# Patient Record
Sex: Male | Born: 1945 | Race: White | Hispanic: No | Smoking: Former smoker
Health system: Southern US, Community
[De-identification: ages and names within clinical notes are randomized; demographics above are authoritative.]

## PROBLEM LIST (undated history)

## (undated) ENCOUNTER — Emergency Department (HOSPITAL_COMMUNITY): Payer: Self-pay | Source: Home / Self Care

## (undated) ENCOUNTER — Emergency Department (HOSPITAL_COMMUNITY): Payer: BC Managed Care – PPO

## (undated) DIAGNOSIS — G459 Transient cerebral ischemic attack, unspecified: Secondary | ICD-10-CM

## (undated) DIAGNOSIS — F988 Other specified behavioral and emotional disorders with onset usually occurring in childhood and adolescence: Secondary | ICD-10-CM

## (undated) DIAGNOSIS — K649 Unspecified hemorrhoids: Secondary | ICD-10-CM

## (undated) DIAGNOSIS — T4145XA Adverse effect of unspecified anesthetic, initial encounter: Secondary | ICD-10-CM

## (undated) DIAGNOSIS — T8859XA Other complications of anesthesia, initial encounter: Secondary | ICD-10-CM

## (undated) DIAGNOSIS — H269 Unspecified cataract: Secondary | ICD-10-CM

## (undated) DIAGNOSIS — K219 Gastro-esophageal reflux disease without esophagitis: Secondary | ICD-10-CM

## (undated) DIAGNOSIS — G43909 Migraine, unspecified, not intractable, without status migrainosus: Secondary | ICD-10-CM

## (undated) DIAGNOSIS — M1990 Unspecified osteoarthritis, unspecified site: Secondary | ICD-10-CM

## (undated) DIAGNOSIS — F32A Depression, unspecified: Secondary | ICD-10-CM

## (undated) DIAGNOSIS — F419 Anxiety disorder, unspecified: Secondary | ICD-10-CM

## (undated) DIAGNOSIS — M109 Gout, unspecified: Secondary | ICD-10-CM

## (undated) DIAGNOSIS — R413 Other amnesia: Secondary | ICD-10-CM

## (undated) DIAGNOSIS — S069X9A Unspecified intracranial injury with loss of consciousness of unspecified duration, initial encounter: Secondary | ICD-10-CM

## (undated) DIAGNOSIS — I10 Essential (primary) hypertension: Secondary | ICD-10-CM

## (undated) DIAGNOSIS — I499 Cardiac arrhythmia, unspecified: Secondary | ICD-10-CM

## (undated) DIAGNOSIS — G4700 Insomnia, unspecified: Secondary | ICD-10-CM

## (undated) DIAGNOSIS — E785 Hyperlipidemia, unspecified: Secondary | ICD-10-CM

## (undated) DIAGNOSIS — F329 Major depressive disorder, single episode, unspecified: Secondary | ICD-10-CM

## (undated) DIAGNOSIS — C61 Malignant neoplasm of prostate: Secondary | ICD-10-CM

## (undated) HISTORY — DX: Anxiety disorder, unspecified: F41.9

## (undated) HISTORY — DX: Gout, unspecified: M10.9

## (undated) HISTORY — DX: Gastro-esophageal reflux disease without esophagitis: K21.9

## (undated) HISTORY — DX: Other complications of anesthesia, initial encounter: T88.59XA

## (undated) HISTORY — DX: Unspecified cataract: H26.9

## (undated) HISTORY — DX: Depression, unspecified: F32.A

## (undated) HISTORY — DX: Unspecified intracranial injury with loss of consciousness of unspecified duration, initial encounter: S06.9X9A

## (undated) HISTORY — DX: Unspecified hemorrhoids: K64.9

## (undated) HISTORY — DX: Insomnia, unspecified: G47.00

## (undated) HISTORY — DX: Hyperlipidemia, unspecified: E78.5

## (undated) HISTORY — DX: Adverse effect of unspecified anesthetic, initial encounter: T41.45XA

## (undated) HISTORY — PX: CATARACT EXTRACTION, BILATERAL: SHX1313

## (undated) HISTORY — DX: Major depressive disorder, single episode, unspecified: F32.9

## (undated) HISTORY — DX: Other amnesia: R41.3

## (undated) HISTORY — DX: Unspecified osteoarthritis, unspecified site: M19.90

## (undated) HISTORY — DX: Other specified behavioral and emotional disorders with onset usually occurring in childhood and adolescence: F98.8

---

## 1984-08-31 HISTORY — PX: ABDOMINAL HERNIA REPAIR: SHX539

## 1999-10-02 ENCOUNTER — Ambulatory Visit (HOSPITAL_COMMUNITY): Admission: RE | Admit: 1999-10-02 | Discharge: 1999-10-02 | Payer: Self-pay | Admitting: Gastroenterology

## 2000-08-31 HISTORY — PX: INGUINAL HERNIA REPAIR: SUR1180

## 2005-01-28 ENCOUNTER — Ambulatory Visit: Payer: Self-pay | Admitting: Gastroenterology

## 2005-02-10 ENCOUNTER — Ambulatory Visit: Payer: Self-pay | Admitting: Gastroenterology

## 2011-03-18 ENCOUNTER — Encounter: Payer: Self-pay | Admitting: Gastroenterology

## 2011-08-28 ENCOUNTER — Other Ambulatory Visit: Payer: Self-pay | Admitting: Family Medicine

## 2011-08-28 DIAGNOSIS — R198 Other specified symptoms and signs involving the digestive system and abdomen: Secondary | ICD-10-CM

## 2011-08-31 ENCOUNTER — Other Ambulatory Visit: Payer: Self-pay | Admitting: Family Medicine

## 2011-08-31 ENCOUNTER — Ambulatory Visit
Admission: RE | Admit: 2011-08-31 | Discharge: 2011-08-31 | Disposition: A | Discharge status: Home / Self Care | Payer: BC Managed Care – PPO | Source: Ambulatory Visit | Attending: Family Medicine | Admitting: Family Medicine

## 2011-08-31 DIAGNOSIS — R198 Other specified symptoms and signs involving the digestive system and abdomen: Secondary | ICD-10-CM

## 2012-01-13 ENCOUNTER — Encounter: Payer: Self-pay | Admitting: Gastroenterology

## 2012-02-08 ENCOUNTER — Encounter: Payer: Self-pay | Admitting: Gastroenterology

## 2012-02-08 ENCOUNTER — Ambulatory Visit (AMBULATORY_SURGERY_CENTER): Payer: BC Managed Care – PPO | Admitting: *Deleted

## 2012-02-08 VITALS — Ht 74.0 in | Wt 209.9 lb

## 2012-02-08 DIAGNOSIS — Z1211 Encounter for screening for malignant neoplasm of colon: Secondary | ICD-10-CM

## 2012-02-08 MED ORDER — PEG-KCL-NACL-NASULF-NA ASC-C 100 G PO SOLR: ORAL | Status: DC

## 2012-02-08 NOTE — Progress Notes (Signed)
No allergies to eggs or soy products 

## 2012-02-22 ENCOUNTER — Encounter: Payer: BC Managed Care – PPO | Admitting: Gastroenterology

## 2012-02-29 HISTORY — PX: COLONOSCOPY: SHX174

## 2012-03-08 ENCOUNTER — Telehealth: Payer: Self-pay | Admitting: Gastroenterology

## 2012-03-08 NOTE — Telephone Encounter (Signed)
Spoke with patient for several minutes to review his medical history with hypoglycemia and the   effects he had with sedation.I advised pt to drink plenty of the clear liquids and to chew gum, and eat hard candy prior to colonoscopy appt.Pt is scheduled at 4:00pm on 03/15/12 for his colonoscopy,but did not want to change to an earlier appt.I advised pt to make sure he discussed this information with the nurse in admitting when he come in for his colonoscopy and I left a message today with the endoscopy center (4th floor) regarding the patients medical history prior to coming in. The patient will call back if he has any problems or further questions. Ulis Rias RN

## 2012-03-15 ENCOUNTER — Ambulatory Visit (AMBULATORY_SURGERY_CENTER): Payer: BC Managed Care – PPO | Admitting: Gastroenterology

## 2012-03-15 ENCOUNTER — Encounter: Payer: Self-pay | Admitting: Gastroenterology

## 2012-03-15 VITALS — BP 138/94 | HR 75 | Temp 98.4°F | Resp 18 | Ht 74.0 in | Wt 209.0 lb

## 2012-03-15 DIAGNOSIS — Z800 Family history of malignant neoplasm of digestive organs: Secondary | ICD-10-CM

## 2012-03-15 DIAGNOSIS — Z1211 Encounter for screening for malignant neoplasm of colon: Secondary | ICD-10-CM

## 2012-03-15 DIAGNOSIS — Z86010 Personal history of colonic polyps: Secondary | ICD-10-CM

## 2012-03-15 MED ORDER — SODIUM CHLORIDE 0.9 % IV SOLN: 500.0000 mL | INTRAVENOUS | Status: DC

## 2012-03-15 NOTE — Op Note (Signed)
[facility] [address] [place], [place]  [address]  COLONOSCOPY PROCEDURE REPORT  PATIENT:  Johnathan Sosa, Johnathan Sosa  MR#:  [id number] BIRTHDATE:  21-Apr-1946, 65 yrs. old  GENDER:  male ENDOSCOPIST:  Judie Petit T. Russella Dar, MD, [facility]  PROCEDURE DATE:  03/15/2012 PROCEDURE:  Colonoscopy 36644 ASA CLASS:  Class II INDICATIONS:  1) surveillance and high-risk screening  2) family hx of colon cancer: father  3) hx of colon polyp type unknown: 2001 MEDICATIONS:   MAC sedation, administered by CRNA 150 mg IV DESCRIPTION OF PROCEDURE:   After the risks benefits and alternatives of the procedure were thoroughly explained, informed consent was obtained.  Digital rectal exam was performed and revealed no abnormalities.   The LB [id number]-H180AL [id number] endoscope was introduced through the anus and advanced to the cecum, which was identified by both the appendix and ileocecal valve, without limitations.  The quality of the prep was good, using MoviPrep. The instrument was then slowly withdrawn as the colon was fully examined. <<PROCEDUREIMAGES>> FINDINGS:  A normal appearing cecum, ileocecal valve, and appendiceal orifice were identified. The ascending, hepatic flexure, transverse, splenic flexure, descending, sigmoid colon, and rectum appeared unremarkable.   Retroflexed views in the rectum revealed internal hemorrhoids, small.  The time to cecum = 2.5  minutes. The scope was then withdrawn (time =  10.5  min) from the patient and the procedure completed.  COMPLICATIONS:  None  ENDOSCOPIC IMPRESSION: 1) Normal colon 2) Internal hemorrhoids  RECOMMENDATIONS: 1) Repeat Colonoscopy in 5 years.  Venita Lick. Russella Dar, MD, [facility]  CC:  Catha Gosselin, MD  n. Rosalie DoctorVenita Lick. Gavino Fouch at 03/15/2012 04:17 PM  [name], [phone]

## 2012-03-15 NOTE — Progress Notes (Signed)
Patient did not experience any of the following events: a burn prior to discharge; a fall within the facility; wrong site/side/patient/procedure/implant event; or a hospital transfer or hospital admission upon discharge from the facility. (G8907) Patient did not have preoperative order for IV antibiotic SSI prophylaxis. (G8918)  

## 2012-03-15 NOTE — Patient Instructions (Addendum)

## 2012-03-16 ENCOUNTER — Telehealth: Payer: Self-pay | Admitting: *Deleted

## 2012-03-16 NOTE — Telephone Encounter (Deleted)
  Follow up Call-  Call back number 03/15/2012  Post procedure Call Back phone  # [phone]  Permission to leave phone message Yes     Patient questions:  Do you have a fever, pain , or abdominal swelling? {yes no:314532} Pain Score  {NUMBERS; 0-10:5044} *  Have you tolerated food without any problems? {yes no:314532}  Have you been able to return to your normal activities? {yes no:314532}  Do you have any questions about your discharge instructions: Diet   {yes no:314532} Medications  {yes no:314532} Follow up visit  {yes no:314532}  Do you have questions or concerns about your Care? {yes no:314532}  Actions: * If pain score is 4 or above: {ACTION; [facility] ENDO PAIN >4:21563::"No action needed, pain <4."}

## 2012-03-16 NOTE — Telephone Encounter (Signed)
No answer. Identifier of phone number. Message left to call if questions or concerns.

## 2012-04-04 ENCOUNTER — Encounter: Payer: BC Managed Care – PPO | Admitting: Gastroenterology

## 2012-04-27 ENCOUNTER — Ambulatory Visit (INDEPENDENT_AMBULATORY_CARE_PROVIDER_SITE_OTHER): Payer: BC Managed Care – PPO | Admitting: Family Medicine

## 2012-04-27 ENCOUNTER — Ambulatory Visit (HOSPITAL_COMMUNITY)
Admission: RE | Admit: 2012-04-27 | Discharge: 2012-04-27 | Disposition: A | Discharge status: Home / Self Care | Payer: BC Managed Care – PPO | Source: Ambulatory Visit | Attending: Family Medicine | Admitting: Family Medicine

## 2012-04-27 ENCOUNTER — Emergency Department (HOSPITAL_COMMUNITY): Payer: BC Managed Care – PPO

## 2012-04-27 ENCOUNTER — Encounter (HOSPITAL_COMMUNITY): Payer: Self-pay | Admitting: Emergency Medicine

## 2012-04-27 ENCOUNTER — Observation Stay (HOSPITAL_COMMUNITY)
Admission: EM | Admit: 2012-04-27 | Discharge: 2012-04-30 | DRG: 883 | Disposition: A | Discharge status: Home / Self Care | Payer: BC Managed Care – PPO | Attending: General Surgery | Admitting: General Surgery

## 2012-04-27 ENCOUNTER — Ambulatory Visit (HOSPITAL_COMMUNITY)
Admission: EM | Admit: 2012-04-27 | Payer: BC Managed Care – PPO | Source: Other Acute Inpatient Hospital | Admitting: General Surgery

## 2012-04-27 VITALS — BP 138/84 | HR 76 | Temp 98.4°F | Resp 18 | Ht 75.0 in | Wt 205.0 lb

## 2012-04-27 DIAGNOSIS — I10 Essential (primary) hypertension: Secondary | ICD-10-CM | POA: Insufficient documentation

## 2012-04-27 DIAGNOSIS — D72829 Elevated white blood cell count, unspecified: Secondary | ICD-10-CM

## 2012-04-27 DIAGNOSIS — I700 Atherosclerosis of aorta: Secondary | ICD-10-CM | POA: Insufficient documentation

## 2012-04-27 DIAGNOSIS — R109 Unspecified abdominal pain: Secondary | ICD-10-CM

## 2012-04-27 DIAGNOSIS — F988 Other specified behavioral and emotional disorders with onset usually occurring in childhood and adolescence: Secondary | ICD-10-CM | POA: Insufficient documentation

## 2012-04-27 DIAGNOSIS — K3580 Unspecified acute appendicitis: Secondary | ICD-10-CM | POA: Insufficient documentation

## 2012-04-27 DIAGNOSIS — K560 Paralytic ileus: Secondary | ICD-10-CM | POA: Insufficient documentation

## 2012-04-27 DIAGNOSIS — E785 Hyperlipidemia, unspecified: Secondary | ICD-10-CM | POA: Insufficient documentation

## 2012-04-27 DIAGNOSIS — R1031 Right lower quadrant pain: Secondary | ICD-10-CM

## 2012-04-27 DIAGNOSIS — K219 Gastro-esophageal reflux disease without esophagitis: Secondary | ICD-10-CM | POA: Insufficient documentation

## 2012-04-27 DIAGNOSIS — M109 Gout, unspecified: Secondary | ICD-10-CM | POA: Insufficient documentation

## 2012-04-27 HISTORY — DX: Essential (primary) hypertension: I10

## 2012-04-27 HISTORY — DX: Migraine, unspecified, not intractable, without status migrainosus: G43.909

## 2012-04-27 LAB — POCT CBC
Hemoglobin: 16.1 g/dL (ref 14.1–18.1)
Lymph, poc: 2.8 (ref 0.6–3.4)
MCH, POC: 30 pg (ref 27–31.2)
MCV: 93.5 fL (ref 80–97)
RBC: 5.36 M/uL (ref 4.69–6.13)
WBC: 11.1 10*3/uL — AB (ref 4.6–10.2)

## 2012-04-27 LAB — POCT URINALYSIS DIPSTICK
Bilirubin, UA: NEGATIVE
Ketones, UA: NEGATIVE
Leukocytes, UA: NEGATIVE
pH, UA: 6

## 2012-04-27 LAB — POCT UA - MICROSCOPIC ONLY
Casts, Ur, LPF, POC: NEGATIVE
Mucus, UA: POSITIVE

## 2012-04-27 MED ORDER — SODIUM CHLORIDE 0.9 % IV SOLN
Freq: Once | INTRAVENOUS | Status: AC
  Administered 2012-04-27: via INTRAVENOUS

## 2012-04-27 MED ORDER — SODIUM CHLORIDE 0.9 % IV SOLN
3.0000 g | Freq: Once | INTRAVENOUS | Status: AC
  Administered 2012-04-27: 3 g via INTRAVENOUS
  Filled 2012-04-27: qty 3

## 2012-04-27 MED ORDER — BUPIVACAINE-EPINEPHRINE PF 0.25-1:200000 % IJ SOLN
INTRAMUSCULAR | Status: AC
  Filled 2012-04-27: qty 30

## 2012-04-27 MED ORDER — ONDANSETRON HCL 4 MG/2ML IJ SOLN
4.0000 mg | Freq: Four times a day (QID) | INTRAMUSCULAR | Status: DC | PRN
  Administered 2012-04-28 (×2): 4 mg via INTRAVENOUS
  Filled 2012-04-27 (×2): qty 2

## 2012-04-27 MED ORDER — MORPHINE SULFATE 4 MG/ML IJ SOLN
2.0000 mg | INTRAMUSCULAR | Status: DC | PRN
  Administered 2012-04-28 – 2012-04-29 (×4): 2 mg via INTRAVENOUS
  Filled 2012-04-27 (×4): qty 1

## 2012-04-27 MED ORDER — IOHEXOL 300 MG/ML  SOLN
100.0000 mL | Freq: Once | INTRAMUSCULAR | Status: AC | PRN
  Administered 2012-04-27: 100 mL via INTRAVENOUS

## 2012-04-27 MED ORDER — LIDOCAINE HCL 1 % IJ SOLN
INTRAMUSCULAR | Status: AC
  Filled 2012-04-27: qty 20

## 2012-04-27 NOTE — Patient Instructions (Addendum)
It is so great to see you again Johnathan Sosa. I want to make sure that your appendix is okay. We are going to send you for a CT scan now. Please call [facility] and please call Dr. Neva Seat or Dr. Katrinka Blazing to give results call [phone] If for some reason this pain gets much worse, you are unable to keep any food or liquid down then I want you to go to the emergency department. Hopefully I will see you again soon.

## 2012-04-27 NOTE — Progress Notes (Signed)
  Subjective:    Patient ID: Johnathan Sosa, male    DOB: 1946/02/13, 66 y.o.   MRN: [id number]  HPI 66 year old male coming in with vague abdominal pain for last several days. Patient states that this did start his right lower quadrant and has now become much more generalized. Patient states starting approximately 3 days ago he started working out again which he believed was the cause of the pain initially. Now though patient states that he has nausea as well as decreased appetite and not feeling like himself. Patient denies any vomiting episodes, diarrhea or constipation. Patient has not had abdominal pain like this before. Patient denies any fever, chills, shortness of breath or chest pain. Patient also denies any back pain or any dysuria. Patient states that the pain now is constant seems to be worsening over the course of time and seems to be starting to localize more in the right lower quadrant. Patient denies any radiation of pain   medications include Klonopin 0.5 mg as needed, Wellbutrin XL 300 mg daily Past medical history is significant for allergies, arthritis, hypertension, migraine headaches Social history, patient does drink occasionally, quit smoking in 1982, and denies illicit use. Patient is drinking caffeine and usually exercises about twice a week. Family history includes cancer in his mother otherwise unremarkable.  Review of Systems As stated above in history of present illness    Objective:   Physical Exam BP 138/84  Pulse 76  Temp 98.4 F (36.9 C) (Oral)  Resp 18  Ht 6\' 3"  (1.905 m)  Wt 205 lb (92.987 kg)  BMI 25.62 kg/m2  SpO2 98% General appearance: alert, cooperative, appears stated age, fatigued and no distress Eyes: conjunctivae/corneas clear. PERRL, EOM's intact. Fundi benign. Throat: lips, mucosa, and tongue normal; teeth and gums normal Neck: no adenopathy, no carotid bruit, no JVD, supple, symmetrical, trachea midline and thyroid not enlarged, symmetric,  no tenderness/mass/nodules Lungs: clear to auscultation bilaterally Heart: regular rate and rhythm, S1, S2 normal, no murmur, click, rub or gallop and normal apical impulse Abdomen: abnormal findings:  guarding, rebound tenderness and Patient does have a positive McBurney's point and pain is generalized but most severe in the right lower contract. Patient also has involuntary guarding and rebound tenderness. Extremities: extremities normal, atraumatic, no cyanosis or edema Pulses: 2+ and symmetric Skin: Skin color, texture, turgor normal. No rashes or lesions  Patient did have a CBC that  Did show an elevated white blood cell count of 11.1 Urine analysis is positive for microscopic hematuria. EKG was ordered and interpreted by me today. Patient has regular rate and rhythm with no ST depressions or elevations or any T-wave inversions. Patient has very good to a progression. Normal EKG    Assessment & Plan:

## 2012-04-27 NOTE — Assessment & Plan Note (Signed)
Patient's history as well as clinical exam is consistent with the clinical diagnosis of appendicitis. Patient does have an elevated white blood cell count, decreased appetite, and focal right lower quadrant abdominal pain. At this point we will send patient over to the hospital to get a CT scan of the abdominal and pelvis with contrast. We will have them call us with the results. Patient has been given red flags and when to seek medical attention. Patient feels fine to drive at this time. We will followup with patient once we know the results of the CT scan.

## 2012-04-27 NOTE — ED Notes (Signed)
Pt reports right lower abdominal pain and went to MD this afternoon and had CT test completed. Pt reports MD stated he had appendicitis and to report for surgery this evening. NAD

## 2012-04-27 NOTE — ED Notes (Signed)
RN notified that pt has not been triaged.

## 2012-04-27 NOTE — Progress Notes (Signed)
Patient discussed with Dr. Katrinka Blazing. Agree with assessment and plan of care per his note.    CT report at 2205:   Findings: Calcified hilar lymph nodes and right middle lobe and  left lower lobe nodules, in keeping with sequelae of prior  granulomas infection. Mild bibasilar opacities. Heart size within  normal limits to mildly enlarged. No pleural or pericardial  effusion.  Unremarkable liver, biliary system, spleen, pancreas, adrenal  glands.  Hypodensity within the upper pole of each kidney, incompletely  characterized, simple cyst favored. No hydronephrosis or  hydroureter.  No bowel obstruction. No CT evidence for colitis. Distended  appendix measuring 14 mm, with periappendiceal fat stranding. No  free intraperitoneal air or loculated fluid. No lymphadenopathy.  Scattered atherosclerosis of the aorta and branch vessels. There  is infrarenal ectasia without aneurysmal dilatation.  Circumferential bladder wall thickening is nonspecific given  incomplete distension. Status post right inguinal hernia repair.  L5-S1 degenerative changes of the imaged spine. No acute or  aggressive appearing osseous lesion.  IMPRESSION:  Acute appendicitis.  Original Report Authenticated By: Waneta Martins, M.D.   Discussed with patient on phone in CT waiting room.  Called general surgeon on call - discussed patient with Dr. Donell Beers. Patient to register in ER, and will be evaluated by Dr. Donell Beers.  Patient advised of plan and understanding expressed.

## 2012-04-27 NOTE — H&P (Signed)
Johnathan Sosa is an 66 y.o. male.   Chief Complaint: Abdominal pain HPI:  Pt is a 66 year old male with 2-3 days of abdominal pain.  It started in the mid abdomen and localized to the RLQ.  He thought this was related to working out, but it kept getting worse, especially today.  He denies diarrhea or constipation.  He denies dysuria.  He has not had shortness of breath.  He had never had pain like this before.  He had a antibiotic prescription for dental work, and he started taking it again.  He went to urgent care and was referred to [facility] for CT scan which was positive for appendicitis.  He has had anorexia and nausea.    Past Medical History  Diagnosis Date  . Gout   . Hyperlipidemia   . ADD (attention deficit disorder)   . Arthritis   . GERD (gastroesophageal reflux disease)     Past Surgical History  Procedure Date  . Abdominal hernia repair 1986  . Inguinal hernia repair 2002    left    Family History  Problem Relation Age of Onset  . Colon cancer Father 21  . Heart failure Father   . Cancer Father   . Stomach cancer Neg Hx   . Cancer Mother    Social History:  reports that he quit smoking about 31 years ago. He has never used smokeless tobacco. He reports that he drinks alcohol. He reports that he does not use illicit drugs.  Allergies:  Allergies  Allergen Reactions  . Black Pepper ([place]) Swelling    redness of skin, profuse sweating  . Sulfa Antibiotics Itching and Rash     MedicationsLong-Term  Prescriptions Show Facility-Administered Medications    allopurinol (ZYLOPRIM) 300 MG tablet   amLODipine (NORVASC) 5 MG tablet   atorvastatin (LIPITOR) 20 MG tablet   buPROPion (WELLBUTRIN XL) 300 MG 24 hr tablet   clonazePAM (KLONOPIN) 1 MG tablet   CODEINE SULFATE PO   Multiple Vitamin (MULTIVITAMIN) tablet   PRESCRIPTION MEDICATION     Results for orders placed in visit on 04/27/12 (from the past 48 hour(s))  POCT CBC     Status: Abnormal   Collection  Time   04/27/12  6:09 PM      Component Value Range Comment   WBC 11.1 (*) 4.6 - 10.2 K/uL    Lymph, poc 2.8  0.6 - 3.4    POC LYMPH PERCENT 25.1  10 - 50 %L    MID (cbc) 0.6  0 - 0.9    POC MID % 5.8  0 - 12 %M    POC Granulocyte 7.7 (*) 2 - 6.9    Granulocyte percent 69.1  37 - 80 %G    RBC 5.36  4.69 - 6.13 M/uL    Hemoglobin 16.1  14.1 - 18.1 g/dL    HCT, POC 16.1  09.6 - 53.7 %    MCV 93.5  80 - 97 fL    MCH, POC 30.0  27 - 31.2 pg    MCHC 32.1  31.8 - 35.4 g/dL    RDW, POC 04.5      Platelet Count, POC 268  142 - 424 K/uL    MPV 8.5  0 - 99.8 fL   POCT URINALYSIS DIPSTICK     Status: Normal   Collection Time   04/27/12  6:15 PM      Component Value Range Comment   Color, UA yellow  Clarity, UA clear      Glucose, UA neg      Bilirubin, UA neg      Ketones, UA neg      Spec Grav, UA 1.020      Blood, UA trace      pH, UA 6.0      Protein, UA neg      Urobilinogen, UA 0.2      Nitrite, UA neg      Leukocytes, UA Negative     POCT UA - MICROSCOPIC ONLY     Status: Normal   Collection Time   04/27/12  6:17 PM      Component Value Range Comment   WBC, Ur, HPF, POC 0-1      RBC, urine, microscopic 5-6      Bacteria, U Microscopic 1+      Mucus, UA pos      Epithelial cells, urine per micros neg      Crystals, Ur, HPF, POC neg      Casts, Ur, LPF, POC neg      Yeast, UA neg      Ct Abdomen Pelvis W Contrast  04/27/2012  *RADIOLOGY REPORT*  Clinical Data: Right lower quadrant pain  CT ABDOMEN AND PELVIS WITH CONTRAST  Technique:  Multidetector CT imaging of the abdomen and pelvis was performed following the standard protocol during bolus administration of intravenous contrast.  Contrast: OMNIPAQUE IOHEXOL 300 MG/ML  SOLN  Comparison: 08/31/2011 ultrasound  Findings: Calcified hilar lymph nodes and right middle lobe and left lower lobe nodules, in keeping with sequelae of prior granulomas infection. Mild bibasilar opacities.  Heart size within normal limits to  mildly enlarged.  No pleural or pericardial effusion.  Unremarkable liver, biliary system, spleen, pancreas, adrenal glands.  Hypodensity within the upper pole of each kidney, incompletely characterized, simple cyst favored.  No hydronephrosis or hydroureter.  No bowel obstruction.  No CT evidence for colitis.  Distended appendix measuring 14 mm, with periappendiceal fat stranding.  No free intraperitoneal air or loculated fluid.  No lymphadenopathy.  Scattered atherosclerosis of the aorta and branch vessels.  There is infrarenal ectasia without aneurysmal dilatation.  Circumferential bladder wall thickening is nonspecific given incomplete distension.  Status post right inguinal hernia repair.  L5-S1 degenerative changes of the imaged spine. No acute or aggressive appearing osseous lesion.  IMPRESSION: Acute appendicitis.   Original Report Authenticated By: Waneta Martins, M.D.     Review of Systems  Constitutional: Positive for fever.  HENT: Negative.   Eyes: Negative.   Respiratory: Negative.   Cardiovascular: Negative.   Gastrointestinal: Positive for nausea and abdominal pain (right lower quadrant). Negative for diarrhea and constipation.       Anorexia   Genitourinary: Negative.   Musculoskeletal: Negative.   Skin: Negative.   Neurological: Negative.   Endo/Heme/Allergies: Negative.   Psychiatric/Behavioral: Negative.     Blood pressure 127/88, pulse 88, temperature 97.9 F (36.6 C), temperature source Oral, resp. rate 18, height 6\' 3"  (1.905 m), weight 199 lb (90.266 kg), SpO2 98.00%. Physical Exam  Constitutional: He is oriented to person, place, and time. He appears well-developed and well-nourished.  HENT:  Head: Normocephalic and atraumatic.  Right Ear: External ear normal.  Left Ear: External ear normal.  Eyes: Pupils are equal, round, and reactive to light. No scleral icterus.  Neck: Normal range of motion. Neck supple. No tracheal deviation present. No thyromegaly  present.  Cardiovascular: Normal rate, regular rhythm, normal heart sounds  and intact distal pulses.  Exam reveals no gallop and no friction rub.   No murmur heard. Respiratory: Effort normal and breath sounds normal. No respiratory distress. He exhibits no tenderness.  GI: Soft. Bowel sounds are normal. He exhibits no mass. There is tenderness (right lower quadrant). There is guarding (voluntary). There is no rebound.  Musculoskeletal: Normal range of motion.  Lymphadenopathy:    He has no cervical adenopathy.  Neurological: He is alert and oriented to person, place, and time. Coordination normal.  Skin: Skin is warm and dry. No rash noted. No erythema. No pallor.  Psychiatric: He has a normal mood and affect. His behavior is normal. Judgment and thought content normal.     Assessment/Plan Acute appendicitis. IVF NPO IV antibiotics Plan lap appendectomy  Appendectomy was described to the patient.  The incisions and surgical technique were explained.  The patient was advised that some of the hair on the abdomen would be clipped, and that a foley catheter would be placed.  I advised the patient of the risks of surgery including, but not limited to, bleeding, infection, damage to other structures, risk of an open operation, risk of abscess, and risk of blood clot.  The recovery was also described to the patient.  He was advised that he will have lifting restrictions for 2 weeks.     Brayla Pat 04/27/2012, 11:28 PM

## 2012-04-27 NOTE — ED Notes (Signed)
Pt was supposed to be a direct admit to OR for Dr. Donell Beers. Surgeon currently in surgery and unable to operate on patient at this time. Bed placement notified but no bed available at this time. AC notified and states she will notify OR of where pt is located.

## 2012-04-27 NOTE — ED Provider Notes (Signed)
History     CSN: [id number]  Arrival date & time 04/27/12  2243   First MD Initiated Contact with Patient 04/27/12 2322      Chief Complaint  Patient presents with  . Appendicitis     (Consider location/radiation/quality/duration/timing/severity/associated sxs/prior treatment) HPI Comments: 66 y/o male with hx of htn, gout and arthritis who presents with RLQ abd pain which is constant and has been present for 2 days but got acutely worse at 10 AM this AM - seen at [facility] and had CT ordered - CT read as acute appy and was sent to ED for surgeon to see.  He has no f/n/v but does have some chills.  Sx are persistent, worse with palpation. Hx of  2 hernia repairs in the past  The history is provided by the patient, the spouse and medical records.    Past Medical History  Diagnosis Date  . Gout   . Hyperlipidemia   . ADD (attention deficit disorder)   . Arthritis   . GERD (gastroesophageal reflux disease)     Past Surgical History  Procedure Date  . Abdominal hernia repair 1986  . Inguinal hernia repair 2002    left    Family History  Problem Relation Age of Onset  . Colon cancer Father 6  . Heart failure Father   . Cancer Father   . Stomach cancer Neg Hx   . Cancer Mother     History  Substance Use Topics  . Smoking status: Former Smoker    Quit date: 02/07/1981  . Smokeless tobacco: Never Used  . Alcohol Use: Yes     occasional      Review of Systems  All other systems reviewed and are negative.    Allergies  Black pepper and Sulfa antibiotics  Home Medications   Current Outpatient Rx  Name Route Sig Dispense Refill  . ALLOPURINOL 300 MG PO TABS Oral Take 300 mg by mouth daily.     [name] Kitchen AMLODIPINE BESYLATE 5 MG PO TABS Oral Take 5 mg by mouth daily.     . ATORVASTATIN CALCIUM 20 MG PO TABS Oral Take 20 mg by mouth daily.     . BUPROPION HCL ER (XL) 300 MG PO TB24 Oral Take 300 mg by mouth daily.     [name] Kitchen CLONAZEPAM 1 MG PO TABS Oral Take 1 mg by mouth at  bedtime as needed. Takes one half tablet    . CODEINE SULFATE PO Oral Take by mouth.    . ONE-DAILY MULTI VITAMINS PO TABS Oral Take 1 tablet by mouth daily.    [name] Kitchen PRESCRIPTION MEDICATION        BP 127/88  Pulse 88  Temp 97.9 F (36.6 C) (Oral)  Resp 18  Ht 6\' 3"  (1.905 m)  Wt 199 lb (90.266 kg)  BMI 24.87 kg/m2  SpO2 98%  Physical Exam  Nursing note and vitals reviewed. Constitutional: He appears well-developed and well-nourished. No distress.  HENT:  Head: Normocephalic and atraumatic.  Mouth/Throat: Oropharynx is clear and moist. No oropharyngeal exudate.  Eyes: Conjunctivae and EOM are normal. Pupils are equal, round, and reactive to light. Right eye exhibits no discharge. Left eye exhibits no discharge. No scleral icterus.  Neck: Normal range of motion. Neck supple. No JVD present. No thyromegaly present.  Cardiovascular: Normal rate, regular rhythm, normal heart sounds and intact distal pulses.  Exam reveals no gallop and no friction rub.   No murmur heard. Pulmonary/Chest: Effort normal and breath sounds  normal. No respiratory distress. He has no wheezes. He has no rales.  Abdominal: Soft. Bowel sounds are normal. He exhibits no distension and no mass. There is tenderness ( focal RLQ ttp with guarding, no peritoneal signs).  Musculoskeletal: Normal range of motion. He exhibits no edema and no tenderness.  Lymphadenopathy:    He has no cervical adenopathy.  Neurological: He is alert. Coordination normal.  Skin: Skin is warm and dry. No rash noted. No erythema.  Psychiatric: He has a normal mood and affect. His behavior is normal.    ED Course  Procedures (including critical care time)   Labs Reviewed  CBC WITH DIFFERENTIAL  BASIC METABOLIC PANEL   Ct Abdomen Pelvis W Contrast  04/27/2012  *RADIOLOGY REPORT*  Clinical Data: Right lower quadrant pain  CT ABDOMEN AND PELVIS WITH CONTRAST  Technique:  Multidetector CT imaging of the abdomen and pelvis was performed  following the standard protocol during bolus administration of intravenous contrast.  Contrast: OMNIPAQUE IOHEXOL 300 MG/ML  SOLN  Comparison: 08/31/2011 ultrasound  Findings: Calcified hilar lymph nodes and right middle lobe and left lower lobe nodules, in keeping with sequelae of prior granulomas infection. Mild bibasilar opacities.  Heart size within normal limits to mildly enlarged.  No pleural or pericardial effusion.  Unremarkable liver, biliary system, spleen, pancreas, adrenal glands.  Hypodensity within the upper pole of each kidney, incompletely characterized, simple cyst favored.  No hydronephrosis or hydroureter.  No bowel obstruction.  No CT evidence for colitis.  Distended appendix measuring 14 mm, with periappendiceal fat stranding.  No free intraperitoneal air or loculated fluid.  No lymphadenopathy.  Scattered atherosclerosis of the aorta and branch vessels.  There is infrarenal ectasia without aneurysmal dilatation.  Circumferential bladder wall thickening is nonspecific given incomplete distension.  Status post right inguinal hernia repair.  L5-S1 degenerative changes of the imaged spine. No acute or aggressive appearing osseous lesion.  IMPRESSION: Acute appendicitis.   Original Report Authenticated By: Waneta Martins, M.D.      No diagnosis found.    MDM  Labs ordered, pt appears stable preop abx ordered.  Surgery aware        Vida Roller, MD 04/28/12 [phone]

## 2012-04-28 ENCOUNTER — Encounter (HOSPITAL_COMMUNITY)
Admission: EM | Disposition: A | Discharge status: Home / Self Care | Payer: Self-pay | Source: Home / Self Care | Attending: Emergency Medicine

## 2012-04-28 ENCOUNTER — Emergency Department (HOSPITAL_COMMUNITY): Payer: BC Managed Care – PPO | Admitting: Anesthesiology

## 2012-04-28 ENCOUNTER — Encounter (HOSPITAL_COMMUNITY): Payer: Self-pay | Admitting: Anesthesiology

## 2012-04-28 ENCOUNTER — Ambulatory Visit: Admit: 2012-04-28 | Payer: Self-pay | Admitting: General Surgery

## 2012-04-28 ENCOUNTER — Encounter (HOSPITAL_COMMUNITY): Payer: Self-pay | Admitting: *Deleted

## 2012-04-28 DIAGNOSIS — K3580 Unspecified acute appendicitis: Secondary | ICD-10-CM | POA: Diagnosis present

## 2012-04-28 HISTORY — PX: LAPAROSCOPIC APPENDECTOMY: SHX408

## 2012-04-28 LAB — CBC
MCV: 85.5 fL (ref 78.0–100.0)
Platelets: 192 10*3/uL (ref 150–400)
RDW: 12.5 % (ref 11.5–15.5)
WBC: 11.5 10*3/uL — ABNORMAL HIGH (ref 4.0–10.5)

## 2012-04-28 LAB — BASIC METABOLIC PANEL
BUN: 14 mg/dL (ref 6–23)
Calcium: 8.6 mg/dL (ref 8.4–10.5)
Calcium: 9.4 mg/dL (ref 8.4–10.5)
Creatinine, Ser: 1.08 mg/dL (ref 0.50–1.35)
Creatinine, Ser: 0.76 mg/dL (ref 0.50–1.35)
GFR calc Af Amer: 82 mL/min — ABNORMAL LOW (ref >90)
GFR calc Af Amer: >90 mL/min (ref >90)
GFR calc non Af Amer: 71 mL/min — ABNORMAL LOW (ref >90)

## 2012-04-28 LAB — CBC WITH DIFFERENTIAL/PLATELET
Basophils Absolute: 0 10*3/uL (ref 0.0–0.1)
Basophils Relative: 0 % (ref 0–1)
Eosinophils Absolute: 0.1 10*3/uL (ref 0.0–0.7)
Eosinophils Relative: 1 % (ref 0–5)
HCT: 44.4 % (ref 39.0–52.0)
MCH: 31.5 pg (ref 26.0–34.0)
MCHC: 36.5 g/dL — ABNORMAL HIGH (ref 30.0–36.0)
MCV: 86.4 fL (ref 78.0–100.0)
Monocytes Absolute: 0.5 10*3/uL (ref 0.1–1.0)
RDW: 12.6 % (ref 11.5–15.5)

## 2012-04-28 SURGERY — APPENDECTOMY, LAPAROSCOPIC
Anesthesia: General | Site: Abdomen | Wound class: Contaminated

## 2012-04-28 MED ORDER — PROPOFOL 10 MG/ML IV BOLUS
INTRAVENOUS | Status: DC | PRN
  Administered 2012-04-28: 200 mg via INTRAVENOUS

## 2012-04-28 MED ORDER — SUCCINYLCHOLINE CHLORIDE 20 MG/ML IJ SOLN
INTRAMUSCULAR | Status: DC | PRN
  Administered 2012-04-28: 140 mg via INTRAVENOUS

## 2012-04-28 MED ORDER — LACTATED RINGERS IR SOLN
Status: DC | PRN
  Administered 2012-04-28: 500 mL

## 2012-04-28 MED ORDER — SODIUM CHLORIDE 0.9 % IV SOLN
INTRAVENOUS | Status: DC | PRN
  Administered 2012-04-28 (×2): via INTRAVENOUS

## 2012-04-28 MED ORDER — EPHEDRINE SULFATE 50 MG/ML IJ SOLN
INTRAMUSCULAR | Status: DC | PRN
  Administered 2012-04-28: 10 mg via INTRAVENOUS
  Administered 2012-04-28: 5 mg via INTRAVENOUS
  Administered 2012-04-28: 10 mg via INTRAVENOUS

## 2012-04-28 MED ORDER — ONDANSETRON HCL 4 MG/2ML IJ SOLN: 4.0000 mg | Freq: Four times a day (QID) | INTRAMUSCULAR | Status: DC | PRN

## 2012-04-28 MED ORDER — IBUPROFEN 200 MG PO TABS: ORAL_TABLET | ORAL | Status: DC

## 2012-04-28 MED ORDER — ENOXAPARIN SODIUM 40 MG/0.4ML ~~LOC~~ SOLN: 40.0000 mg | SUBCUTANEOUS | Status: DC

## 2012-04-28 MED ORDER — KETOROLAC TROMETHAMINE 30 MG/ML IJ SOLN
30.0000 mg | Freq: Four times a day (QID) | INTRAMUSCULAR | Status: AC
  Administered 2012-04-28 – 2012-04-29 (×4): 30 mg via INTRAVENOUS
  Filled 2012-04-28 (×4): qty 1

## 2012-04-28 MED ORDER — ACETAMINOPHEN 10 MG/ML IV SOLN
INTRAVENOUS | Status: AC
  Filled 2012-04-28: qty 100

## 2012-04-28 MED ORDER — OXYCODONE-ACETAMINOPHEN 5-325 MG PO TABS
1.0000 | ORAL_TABLET | ORAL | Status: DC | PRN
Start: 2012-04-28 — End: 2012-04-29

## 2012-04-28 MED ORDER — ROCURONIUM BROMIDE 100 MG/10ML IV SOLN
INTRAVENOUS | Status: DC | PRN
  Administered 2012-04-28: 20 mg via INTRAVENOUS

## 2012-04-28 MED ORDER — ACETAMINOPHEN 10 MG/ML IV SOLN
INTRAVENOUS | Status: DC | PRN
  Administered 2012-04-28: 1000 mg via INTRAVENOUS

## 2012-04-28 MED ORDER — ERTAPENEM SODIUM 1 G IJ SOLR
1.0000 g | INTRAMUSCULAR | Status: AC
  Administered 2012-04-28: 1 g via INTRAVENOUS
  Filled 2012-04-28: qty 1

## 2012-04-28 MED ORDER — CEPHALEXIN 500 MG PO CAPS: 500.0000 mg | ORAL_CAPSULE | Freq: Four times a day (QID) | ORAL | Status: DC

## 2012-04-28 MED ORDER — ENOXAPARIN SODIUM 40 MG/0.4ML ~~LOC~~ SOLN
40.0000 mg | SUBCUTANEOUS | Status: DC
  Administered 2012-04-28 – 2012-04-29 (×2): 40 mg via SUBCUTANEOUS
  Filled 2012-04-28 (×3): qty 0.4

## 2012-04-28 MED ORDER — AMLODIPINE BESYLATE 2.5 MG PO TABS
2.5000 mg | ORAL_TABLET | Freq: Every day | ORAL | Status: DC
  Filled 2012-04-28 (×2): qty 1

## 2012-04-28 MED ORDER — HYDROMORPHONE HCL PF 1 MG/ML IJ SOLN
0.2500 mg | INTRAMUSCULAR | Status: DC | PRN
  Administered 2012-04-28 (×2): 0.5 mg via INTRAVENOUS

## 2012-04-28 MED ORDER — ACETAMINOPHEN 325 MG PO TABS: 650.0000 mg | ORAL_TABLET | Freq: Four times a day (QID) | ORAL | Status: DC | PRN

## 2012-04-28 MED ORDER — NEOSTIGMINE METHYLSULFATE 1 MG/ML IJ SOLN
INTRAMUSCULAR | Status: DC | PRN
  Administered 2012-04-28: 4 mg via INTRAVENOUS

## 2012-04-28 MED ORDER — LIDOCAINE HCL (PF) 1 % IJ SOLN
INTRAMUSCULAR | Status: DC | PRN
  Administered 2012-04-28: 5 mL

## 2012-04-28 MED ORDER — KCL IN DEXTROSE-NACL 20-5-0.45 MEQ/L-%-% IV SOLN
INTRAVENOUS | Status: DC
  Administered 2012-04-28 – 2012-04-29 (×5): via INTRAVENOUS
  Filled 2012-04-28 (×7): qty 1000

## 2012-04-28 MED ORDER — ATORVASTATIN CALCIUM 20 MG PO TABS
20.0000 mg | ORAL_TABLET | Freq: Every day | ORAL | Status: DC
  Administered 2012-04-29: 20 mg via ORAL
  Filled 2012-04-28 (×3): qty 1

## 2012-04-28 MED ORDER — BUPROPION HCL ER (XL) 300 MG PO TB24
300.0000 mg | ORAL_TABLET | Freq: Every day | ORAL | Status: DC
Start: 2012-04-28 — End: 2012-04-30
  Filled 2012-04-28 (×3): qty 1

## 2012-04-28 MED ORDER — LIDOCAINE HCL (CARDIAC) 20 MG/ML IV SOLN
INTRAVENOUS | Status: DC | PRN
  Administered 2012-04-28: 80 mg via INTRAVENOUS

## 2012-04-28 MED ORDER — AMLODIPINE BESYLATE 5 MG PO TABS
5.0000 mg | ORAL_TABLET | Freq: Every day | ORAL | Status: DC
  Filled 2012-04-28: qty 1

## 2012-04-28 MED ORDER — HYDROMORPHONE HCL PF 1 MG/ML IJ SOLN
INTRAMUSCULAR | Status: AC
  Filled 2012-04-28: qty 1

## 2012-04-28 MED ORDER — PROMETHAZINE HCL 25 MG/ML IJ SOLN: 6.2500 mg | INTRAMUSCULAR | Status: DC | PRN

## 2012-04-28 MED ORDER — GLYCOPYRROLATE 0.2 MG/ML IJ SOLN
INTRAMUSCULAR | Status: DC | PRN
  Administered 2012-04-28: 0.6 mg via INTRAVENOUS

## 2012-04-28 MED ORDER — OXYCODONE-ACETAMINOPHEN 5-325 MG PO TABS: 1.0000 | ORAL_TABLET | ORAL | Status: DC | PRN

## 2012-04-28 MED ORDER — ONDANSETRON HCL 4 MG PO TABS: 4.0000 mg | ORAL_TABLET | Freq: Four times a day (QID) | ORAL | Status: DC | PRN

## 2012-04-28 MED ORDER — ALLOPURINOL 300 MG PO TABS
300.0000 mg | ORAL_TABLET | Freq: Every day | ORAL | Status: DC
  Filled 2012-04-28 (×3): qty 1

## 2012-04-28 MED ORDER — CHLORHEXIDINE GLUCONATE 0.12 % MT SOLN
15.0000 mL | Freq: Two times a day (BID) | OROMUCOSAL | Status: DC
  Administered 2012-04-28 – 2012-04-30 (×5): 15 mL via OROMUCOSAL
  Filled 2012-04-28 (×6): qty 15

## 2012-04-28 MED ORDER — CLONAZEPAM 1 MG PO TABS
1.0000 mg | ORAL_TABLET | Freq: Every evening | ORAL | Status: DC | PRN
Start: 2012-04-28 — End: 2012-04-30
  Administered 2012-04-30: 1 mg via ORAL
  Filled 2012-04-28: qty 1

## 2012-04-28 MED ORDER — IBUPROFEN 600 MG PO TABS: 600.0000 mg | ORAL_TABLET | Freq: Four times a day (QID) | ORAL | Status: DC | PRN

## 2012-04-28 MED ORDER — BUPIVACAINE-EPINEPHRINE 0.25% -1:200000 IJ SOLN
INTRAMUSCULAR | Status: DC | PRN
  Administered 2012-04-28: 5 mL

## 2012-04-28 MED ORDER — FENTANYL CITRATE 0.05 MG/ML IJ SOLN
INTRAMUSCULAR | Status: DC | PRN
  Administered 2012-04-28: 50 ug via INTRAVENOUS
  Administered 2012-04-28: 100 ug via INTRAVENOUS

## 2012-04-28 SURGICAL SUPPLY — 51 items
ADH SKN CLS APL DERMABOND .7 (GAUZE/BANDAGES/DRESSINGS) ×1
APL SKNCLS STERI-STRIP NONHPOA (GAUZE/BANDAGES/DRESSINGS) ×1
APPLIER CLIP ROT 10 11.4 M/L (STAPLE)
APR CLP MED LRG 11.4X10 (STAPLE)
BAG SPEC RTRVL LRG 6X4 10 (ENDOMECHANICALS) ×1
BENZOIN TINCTURE PRP APPL 2/3 (GAUZE/BANDAGES/DRESSINGS) ×2 IMPLANT
CANISTER SUCTION 2500CC (MISCELLANEOUS) ×2 IMPLANT
CLIP APPLIE ROT 10 11.4 M/L (STAPLE) IMPLANT
CLOTH BEACON ORANGE TIMEOUT ST (SAFETY) ×2 IMPLANT
CUTTER FLEX LINEAR 45M (STAPLE) ×1 IMPLANT
DECANTER SPIKE VIAL GLASS SM (MISCELLANEOUS) ×3 IMPLANT
DERMABOND ADVANCED (GAUZE/BANDAGES/DRESSINGS) ×1
DERMABOND ADVANCED .7 DNX12 (GAUZE/BANDAGES/DRESSINGS) IMPLANT
DRAPE LAPAROSCOPIC ABDOMINAL (DRAPES) ×2 IMPLANT
DRAPE UTILITY 15X26 (DRAPE) ×1 IMPLANT
DRSG TEGADERM 2-3/8X2-3/4 SM (GAUZE/BANDAGES/DRESSINGS) IMPLANT
DRSG TEGADERM 4X4.75 (GAUZE/BANDAGES/DRESSINGS) IMPLANT
ELECT REM PT RETURN 9FT ADLT (ELECTROSURGICAL) ×2
ELECTRODE REM PT RTRN 9FT ADLT (ELECTROSURGICAL) ×1 IMPLANT
ENDOLOOP SUT PDS II  0 18 (SUTURE)
ENDOLOOP SUT PDS II 0 18 (SUTURE) IMPLANT
GLOVE BIO SURGEON STRL SZ 6 (GLOVE) ×4 IMPLANT
GLOVE BIOGEL PI IND STRL 6.5 (GLOVE) ×1 IMPLANT
GLOVE BIOGEL PI IND STRL 7.0 (GLOVE) ×1 IMPLANT
GLOVE BIOGEL PI INDICATOR 6.5 (GLOVE) ×1
GLOVE BIOGEL PI INDICATOR 7.0 (GLOVE) ×1
GLOVE INDICATOR 6.5 STRL GRN (GLOVE) ×4 IMPLANT
GOWN PREVENTION PLUS XXLARGE (GOWN DISPOSABLE) ×2 IMPLANT
IV LACTATED RINGERS 1000ML (IV SOLUTION) ×1 IMPLANT
KIT BASIN OR (CUSTOM PROCEDURE TRAY) ×2 IMPLANT
NS IRRIG 1000ML POUR BTL (IV SOLUTION) ×1 IMPLANT
PENCIL BUTTON HOLSTER BLD 10FT (ELECTRODE) IMPLANT
POUCH SPECIMEN RETRIEVAL 10MM (ENDOMECHANICALS) ×2 IMPLANT
RELOAD 45 VASCULAR/THIN (ENDOMECHANICALS) ×2 IMPLANT
RELOAD STAPLE 45 2.5 WHT GRN (ENDOMECHANICALS) ×1 IMPLANT
RELOAD STAPLE 45 3.5 BLU ETS (ENDOMECHANICALS) ×1 IMPLANT
RELOAD STAPLE TA45 3.5 REG BLU (ENDOMECHANICALS) ×4 IMPLANT
SCALPEL HARMONIC ACE (MISCELLANEOUS) IMPLANT
SET IRRIG TUBING LAPAROSCOPIC (IRRIGATION / IRRIGATOR) ×2 IMPLANT
SOLUTION ANTI FOG 6CC (MISCELLANEOUS) ×2 IMPLANT
STRIP CLOSURE SKIN 1/2X4 (GAUZE/BANDAGES/DRESSINGS) ×1 IMPLANT
SUT MNCRL AB 4-0 PS2 18 (SUTURE) ×2 IMPLANT
SUT VICRYL 0 ENDOLOOP (SUTURE) IMPLANT
TOWEL OR 17X26 10 PK STRL BLUE (TOWEL DISPOSABLE) ×2 IMPLANT
TRAY FOLEY CATH 14FRSI W/METER (CATHETERS) IMPLANT
TRAY LAP CHOLE (CUSTOM PROCEDURE TRAY) ×2 IMPLANT
TROCAR BLADELESS OPT 5 75 (ENDOMECHANICALS) ×4 IMPLANT
TROCAR XCEL BLUNT TIP 100MML (ENDOMECHANICALS) ×2 IMPLANT
TROCAR XCEL NON-BLD 11X100MML (ENDOMECHANICALS) IMPLANT
TUBING INSUFFLATION 10FT LAP (TUBING) ×2 IMPLANT
WATER STERILE IRR 1500ML POUR (IV SOLUTION) ×1 IMPLANT

## 2012-04-28 NOTE — Preoperative (Signed)
Beta Blockers   Reason not to administer Beta Blockers:Not Applicable 

## 2012-04-28 NOTE — Progress Notes (Signed)
Pt up in BR attempting to urinate.

## 2012-04-28 NOTE — Progress Notes (Signed)
Pt states he had an ekg prior to surgery. Pt refused ekg at this time d/t pt states "i do not want to be charged for 2 ekg's."

## 2012-04-28 NOTE — Progress Notes (Signed)
Checked patient BP before morning medication, BP 100/61, checked manual BP 104/68. Patient stated its not normal, that he runs about 130s at home; no any physical distress noted. Informed patient I will notify the MD. W. Vevelyn Pat notified and stated will be coming to see/assess patient. Patient informed again that PA is on the way but patient stated he wants the doctor to be "here right now", charge nurse informed. Will continue to assess/monitor patient and F/U with plan of care.

## 2012-04-28 NOTE — Progress Notes (Signed)
Lower amlodipine dose for now D/C patient from hospital when patient meets criteria:  Tolerating oral intake well Ambulating in walkways Adequate pain control without IV medications Urinating  Having flatus

## 2012-04-28 NOTE — Anesthesia Postprocedure Evaluation (Signed)
  Anesthesia Post-op Note  Patient: Johnathan Sosa  Procedure(s) Performed: Procedure(s) (LRB): APPENDECTOMY LAPAROSCOPIC (N/A)  Patient Location: PACU  Anesthesia Type: General  Level of Consciousness: sedated and patient cooperative  Airway and Oxygen Therapy: Patient Spontanous Breathing and Patient connected to nasal cannula oxygen  Post-op Pain: none  Post-op Assessment: Post-op Vital signs reviewed, Patient's Cardiovascular Status Stable, Respiratory Function Stable, Patent Airway, No signs of Nausea or vomiting and Pain level controlled  Post-op Vital Signs: Reviewed and stable  Complications: No apparent anesthesia complications

## 2012-04-28 NOTE — Transfer of Care (Signed)
Immediate Anesthesia Transfer of Care Note  Patient: Johnathan Sosa  Procedure(s) Performed: Procedure(s) (LRB): APPENDECTOMY LAPAROSCOPIC (N/A)  Patient Location: PACU  Anesthesia Type: General  Level of Consciousness: awake, alert , oriented and patient cooperative  Airway & Oxygen Therapy: Patient Spontanous Breathing and Patient connected to face mask oxygen  Post-op Assessment: Report given to PACU RN, Post -op Vital signs reviewed and stable and Patient moving all extremities X 4  Post vital signs: Reviewed and stable  Complications: No apparent anesthesia complications

## 2012-04-28 NOTE — Anesthesia Preprocedure Evaluation (Addendum)
Anesthesia Evaluation  Patient identified by MRN, date of birth, ID band Patient awake    Reviewed: Allergy & Precautions, H&P , NPO status , Patient's Chart, lab work & pertinent test results  Airway Mallampati: II TM Distance: >3 FB Neck ROM: Full    Dental No notable dental hx.    Pulmonary former smoker,  breath sounds clear to auscultation  Pulmonary exam normal       Cardiovascular hypertension, Pt. on medications negative cardio ROS  Rhythm:Regular Rate:Normal     Neuro/Psych PSYCHIATRIC DISORDERS ADDnegative neurological ROS     GI/Hepatic Neg liver ROS, GERD-  ,  Endo/Other  negative endocrine ROS  Renal/GU negative Renal ROS  negative genitourinary   Musculoskeletal negative musculoskeletal ROS (+)   Abdominal   Peds negative pediatric ROS (+)  Hematology negative hematology ROS (+)   Anesthesia Other Findings   Reproductive/Obstetrics negative OB ROS                          Anesthesia Physical Anesthesia Plan  ASA: II  Anesthesia Plan: General   Post-op Pain Management:    Induction: Intravenous  Airway Management Planned: Oral ETT  Additional Equipment:   Intra-op Plan:   Post-operative Plan: Extubation in OR  Informed Consent: I have reviewed the patients History and Physical, chart, labs and discussed the procedure including the risks, benefits and alternatives for the proposed anesthesia with the patient or authorized representative who has indicated his/her understanding and acceptance.   Dental advisory given  Plan Discussed with: CRNA  Anesthesia Plan Comments:         Anesthesia Quick Evaluation

## 2012-04-28 NOTE — Progress Notes (Signed)
Day of Surgery  Subjective: Feels better, upset over BP 100/61, slow dietary service.  Objective: Vital signs in last 24 hours: Temp:  [97.6 F (36.4 C)-98.4 F (36.9 C)] 97.6 F (36.4 C) (08/29 0256) Pulse Rate:  [76-88] 82  (08/29 0256) Resp:  [11-18] 16  (08/29 0256) BP: (118-162)/(72-104) 118/72 mmHg (08/29 1059) SpO2:  [98 %-100 %] 100 % (08/29 0256) Weight:  [90.266 kg (199 lb)-92.987 kg (205 lb)] 92.2 kg (203 lb 4.2 oz) (08/29 0311) Last BM Date: 04/27/12  Full liquid diet,  Intake/Output from previous day: 08/28 0701 - 08/29 0700 In: 1335 [P.O.:240; I.V.:1095] Out: 410 [Urine:400; Blood:10] Intake/Output this shift:    General appearance: alert, cooperative and no distress GI: soft, few bs, incision looks good, no nausea, no distension  Lab Results:   Basename 04/28/12 0918 04/28/12 0425  WBC 11.1* 11.5*  HGB 16.2 14.2  HCT 44.4 40.0  PLT 214 192    BMET  Basename 04/28/12 0918 04/28/12 0425  NA 136 137  K 4.0 3.9  CL 102 103  CO2 25 26  GLUCOSE 146* 125*  BUN 14 12  CREATININE 0.76 1.08  CALCIUM 9.4 8.6   PT/INR No results found for this basename: LABPROT:2,INR:2 in the last 72 hours  No results found for this basename: AST:5,ALT:5,ALKPHOS:5,BILITOT:5,PROT:5,ALBUMIN:5 in the last 168 hours   Lipase  No results found for this basename: lipase     Studies/Results: Ct Abdomen Pelvis W Contrast  04/27/2012  *RADIOLOGY REPORT*  Clinical Data: Right lower quadrant pain  CT ABDOMEN AND PELVIS WITH CONTRAST  Technique:  Multidetector CT imaging of the abdomen and pelvis was performed following the standard protocol during bolus administration of intravenous contrast.  Contrast: OMNIPAQUE IOHEXOL 300 MG/ML  SOLN  Comparison: 08/31/2011 ultrasound  Findings: Calcified hilar lymph nodes and right middle lobe and left lower lobe nodules, in keeping with sequelae of prior granulomas infection. Mild bibasilar opacities.  Heart size within normal limits to  mildly enlarged.  No pleural or pericardial effusion.  Unremarkable liver, biliary system, spleen, pancreas, adrenal glands.  Hypodensity within the upper pole of each kidney, incompletely characterized, simple cyst favored.  No hydronephrosis or hydroureter.  No bowel obstruction.  No CT evidence for colitis.  Distended appendix measuring 14 mm, with periappendiceal fat stranding.  No free intraperitoneal air or loculated fluid.  No lymphadenopathy.  Scattered atherosclerosis of the aorta and branch vessels.  There is infrarenal ectasia without aneurysmal dilatation.  Circumferential bladder wall thickening is nonspecific given incomplete distension.  Status post right inguinal hernia repair.  L5-S1 degenerative changes of the imaged spine. No acute or aggressive appearing osseous lesion.  IMPRESSION: Acute appendicitis.   Original Report Authenticated By: Waneta Martins, M.D.     Medications:    . sodium chloride   Intravenous Once  . allopurinol  300 mg Oral Daily  . amLODipine  5 mg Oral Daily  . ampicillin-sulbactam (UNASYN) IV  3 g Intravenous Once  . atorvastatin  20 mg Oral Daily  . buPROPion  300 mg Oral Daily  . enoxaparin (LOVENOX) injection  40 mg Subcutaneous Q24H  . ertapenem (INVANZ) IV  1 g Intravenous Q24H  . ketorolac  30 mg Intravenous Q6H    Assessment/Plan Acute appendicitis without mention of peritonitis, s/p Laparoscopic Appendectomy, Laparoscopic Appendectomy 04/28/2012. Gout  Hyperlipidemia  ADD (attention deficit disorder)  Arthritis  GERD (gastroesophageal reflux disease)      .  Plan:  Mobilize, recheck BP, ambulate, advance  diet.  Home later today or tomorrow        LOS: 1 day    Johnathan Sosa 04/28/2012

## 2012-04-28 NOTE — Op Note (Addendum)
Laparoscopic Appendectomy  Indications: The patient presented with a history of right-sided abdominal pain. A CT revealed findings of acute appendicitis  Pre-operative Diagnosis: Acute appendicitis without mention of peritonitis   Post-operative Diagnosis: Same   Surgeon: [facility]   Anesthesia: General endotracheal anesthesia and Local anesthesia 1% buffered lidocaine, 0.25.% bupivacaine, with epinephrine   ASA Class: 2E   Procedure Details  The patient was seen again in the Holding Room. The risks, benefits, complications, treatment options, and expected outcomes were discussed with the patient and/or family. The possibilities of perforation of viscus, bleeding, recurrent infection, the need for additional procedures, failure to diagnose a condition, and creating a complication requiring transfusion or operation were discussed. There was concurrence with the proposed plan and informed consent was obtained. The site of surgery was properly noted. The patient was taken to Operating Room, identified as Johnathan Sosa and the procedure verified as Appendectomy. A Time Out was held and the above information confirmed.   The patient was placed in the supine position and general anesthesia was induced, along with placement of orogastric tube, Venodyne boots, and a Foley catheter. The abdomen was prepped and draped in a sterile fashion.   The periumbilical skin was anesthetized with local anesthetic and a 1 cm vertical incision was made with the #11 blade. A Kelly clamp was used to spread the subcutaneous tissue. The fascia was elevated with 2 Kocher clamps. The fascia was incised in the midline with the #11 blade. A pursestring suture was placed with a 0-0 Vicryl suture. The Hasson trocar was introduced into the abdomen and held in place with the tails of the suture.   The pneumoperitoneum was then established to steady pressure of 15 mmHg. Additional 5 mm cannulas then placed in the left lower  quadrant of the abdomen and the suprapubic region under direct visualization. A careful evaluation of the entire abdomen was carried out. The patient was placed in Trendelenburg and rotated to the left. The small intestines were retracted in the cephalad and left lateral direction away from the pelvis and right lower quadrant. The patient was found to have acute appendicitis with dilation and hyperemia of the entire appendix.  There appeared to be a fecalith at the appendiceal base.  . There was no evidence of perforation.  The appendix was carefully dissected. The appendix was was skeletonized with the harmonic scalpel. The appendix was divided just below its base using an endo-GIA stapler. Minimal appendiceal stump was left in place. The appendix was removed from the abdomen with an Endocatch bag through the left subcostal port. There was no evidence of bleeding, leakage, or complication after division of the appendix. Irrigation was also performed and irrigate suctioned from the abdomen as well. The 5 mm trocars were removed. The pneumoperitoneum was evacuated from the abdomen.   The pursestring suture was tied down. There was no residual fascial defect. The trocar site skin wounds were closed with 4-0 Monocryl and dressed with Dermabond.  Instrument, sponge, and needle counts were correct at the conclusion of the case.   Findings:  Acute appendicitis  Estimated Blood Loss: Minimal   Specimens: appendix to pathology   Complications: None; patient tolerated the procedure well.   Disposition: PACU - hemodynamically stable.   Condition: stable

## 2012-04-29 ENCOUNTER — Encounter (HOSPITAL_COMMUNITY): Payer: Self-pay | Admitting: General Surgery

## 2012-04-29 ENCOUNTER — Ambulatory Visit (HOSPITAL_COMMUNITY): Payer: BC Managed Care – PPO

## 2012-04-29 DIAGNOSIS — F988 Other specified behavioral and emotional disorders with onset usually occurring in childhood and adolescence: Secondary | ICD-10-CM | POA: Diagnosis present

## 2012-04-29 DIAGNOSIS — K219 Gastro-esophageal reflux disease without esophagitis: Secondary | ICD-10-CM | POA: Diagnosis present

## 2012-04-29 DIAGNOSIS — I10 Essential (primary) hypertension: Secondary | ICD-10-CM

## 2012-04-29 HISTORY — DX: Essential (primary) hypertension: I10

## 2012-04-29 MED ORDER — SORBITOL 70 % SOLN
960.0000 mL | TOPICAL_OIL | Freq: Once | ORAL | Status: DC
  Filled 2012-04-29: qty 240

## 2012-04-29 MED ORDER — PSYLLIUM 95 % PO PACK
1.0000 | PACK | Freq: Two times a day (BID) | ORAL | Status: DC
  Administered 2012-04-29 – 2012-04-30 (×2): 1 via ORAL
  Filled 2012-04-29 (×4): qty 1

## 2012-04-29 MED ORDER — DIPHENHYDRAMINE HCL 50 MG/ML IJ SOLN: 12.5000 mg | Freq: Four times a day (QID) | INTRAMUSCULAR | Status: DC | PRN

## 2012-04-29 MED ORDER — CEPHALEXIN 500 MG PO CAPS
500.0000 mg | ORAL_CAPSULE | Freq: Four times a day (QID) | ORAL | Status: DC
  Administered 2012-04-29: 500 mg via ORAL
  Filled 2012-04-29 (×6): qty 1

## 2012-04-29 MED ORDER — SIMETHICONE 80 MG PO CHEW
80.0000 mg | CHEWABLE_TABLET | Freq: Four times a day (QID) | ORAL | Status: DC | PRN
  Filled 2012-04-29: qty 1

## 2012-04-29 MED ORDER — SODIUM CHLORIDE 0.9 % IJ SOLN: 3.0000 mL | INTRAMUSCULAR | Status: DC | PRN

## 2012-04-29 MED ORDER — MORPHINE SULFATE 4 MG/ML IJ SOLN
1.0000 mg | INTRAMUSCULAR | Status: DC | PRN
  Administered 2012-04-29: 4 mg via INTRAVENOUS
  Filled 2012-04-29: qty 1

## 2012-04-29 MED ORDER — LIP MEDEX EX OINT
1.0000 "application " | TOPICAL_OINTMENT | Freq: Two times a day (BID) | CUTANEOUS | Status: DC
  Administered 2012-04-29 (×2): 1 via TOPICAL
  Filled 2012-04-29: qty 7

## 2012-04-29 MED ORDER — IBUPROFEN 600 MG PO TABS
600.0000 mg | ORAL_TABLET | Freq: Four times a day (QID) | ORAL | Status: DC
  Administered 2012-04-29 (×2): 600 mg via ORAL
  Filled 2012-04-29 (×7): qty 1

## 2012-04-29 MED ORDER — SODIUM CHLORIDE 0.9 % IV SOLN: 250.0000 mL | INTRAVENOUS | Status: DC | PRN

## 2012-04-29 MED ORDER — MAGNESIUM HYDROXIDE 400 MG/5ML PO SUSP: 30.0000 mL | Freq: Two times a day (BID) | ORAL | Status: DC | PRN

## 2012-04-29 MED ORDER — MAGIC MOUTHWASH
15.0000 mL | Freq: Four times a day (QID) | ORAL | Status: DC | PRN
  Filled 2012-04-29: qty 15

## 2012-04-29 MED ORDER — SORBITOL 70 % SOLN: 960.0000 mL | TOPICAL_OIL | Freq: Once | ORAL | Status: DC

## 2012-04-29 MED ORDER — PHENOL 1.4 % MT LIQD
2.0000 | OROMUCOSAL | Status: DC | PRN
  Filled 2012-04-29: qty 177

## 2012-04-29 MED ORDER — MAGNESIUM HYDROXIDE 400 MG/5ML PO SUSP
30.0000 mL | Freq: Once | ORAL | Status: AC
  Administered 2012-04-29: 30 mL via ORAL
  Filled 2012-04-29: qty 30

## 2012-04-29 MED ORDER — ADULT MULTIVITAMIN W/MINERALS CH
1.0000 | ORAL_TABLET | Freq: Every day | ORAL | Status: DC
  Administered 2012-04-29: 1 via ORAL
  Filled 2012-04-29 (×2): qty 1

## 2012-04-29 MED ORDER — OXYCODONE HCL 5 MG PO TABS: 5.0000 mg | ORAL_TABLET | ORAL | Status: DC | PRN

## 2012-04-29 MED ORDER — BISACODYL 10 MG RE SUPP: 10.0000 mg | Freq: Two times a day (BID) | RECTAL | Status: DC | PRN

## 2012-04-29 MED ORDER — SODIUM CHLORIDE 0.9 % IJ SOLN
3.0000 mL | Freq: Two times a day (BID) | INTRAMUSCULAR | Status: DC
  Administered 2012-04-29: 3 mL via INTRAVENOUS

## 2012-04-29 NOTE — Progress Notes (Signed)
Patient ID: Johnathan Sosa, male   DOB: 29-Jul-1946, 66 y.o.   MRN: [id number] 1 Day Post-Op  Subjective: Feels "bloated" and painfull abdomen this am. No Flatus or BM so far. Had been on regular diet. No N/V associated with c/o.   Objective: Vital signs in last 24 hours: Temp:  [98 F (36.7 C)-98.3 F (36.8 C)] 98 F (36.7 C) (08/30 0518) Pulse Rate:  [66-94] 77  (08/30 0518) Resp:  [16] 16  (08/30 0518) BP: (100-125)/(61-77) 125/77 mmHg (08/30 0518) SpO2:  [95 %-96 %] 95 % (08/30 0518) Last BM Date: 04/27/12   Intake/Output from previous day: 08/29 0701 - 08/30 0700 In: 1400 [I.V.:1400] Out: -  Intake/Output this shift:    General appearance: Appears uncomfortable at present, states he feels "bloated". Chest: CTA bilaterally Cardiac: RRR, No M/R/G Abdomen: slightly distended, no BS, No BM or flatus, no N/V. Surgical wound sites appear well approximated, w/o erythema or drainage. Extremities: w/o edema, warm 2+ pulses. VSS,afebrile.  Lab Results:   Basename 04/28/12 0918 04/28/12 0425  WBC 11.1* 11.5*  HGB 16.2 14.2  HCT 44.4 40.0  PLT 214 192    BMET  Basename 04/28/12 0918 04/28/12 0425  NA 136 137  K 4.0 3.9  CL 102 103  CO2 25 26  GLUCOSE 146* 125*  BUN 14 12  CREATININE 0.76 1.08  CALCIUM 9.4 8.6   PT/INR No results found for this basename: LABPROT:2,INR:2 in the last 72 hours  No results found for this basename: AST:5,ALT:5,ALKPHOS:5,BILITOT:5,PROT:5,ALBUMIN:5 in the last 168 hours   Lipase  No results found for this basename: lipase     Studies/Results: Ct Abdomen Pelvis W Contrast  04/27/2012  *RADIOLOGY REPORT*  Clinical Data: Right lower quadrant pain  CT ABDOMEN AND PELVIS WITH CONTRAST  Technique:  Multidetector CT imaging of the abdomen and pelvis was performed following the standard protocol during bolus administration of intravenous contrast.  Contrast: OMNIPAQUE IOHEXOL 300 MG/ML  SOLN  Comparison: 08/31/2011 ultrasound   Findings: Calcified hilar lymph nodes and right middle lobe and left lower lobe nodules, in keeping with sequelae of prior granulomas infection. Mild bibasilar opacities.  Heart size within normal limits to mildly enlarged.  No pleural or pericardial effusion.  Unremarkable liver, biliary system, spleen, pancreas, adrenal glands.  Hypodensity within the upper pole of each kidney, incompletely characterized, simple cyst favored.  No hydronephrosis or hydroureter.  No bowel obstruction.  No CT evidence for colitis.  Distended appendix measuring 14 mm, with periappendiceal fat stranding.  No free intraperitoneal air or loculated fluid.  No lymphadenopathy.  Scattered atherosclerosis of the aorta and branch vessels.  There is infrarenal ectasia without aneurysmal dilatation.  Circumferential bladder wall thickening is nonspecific given incomplete distension.  Status post right inguinal hernia repair.  L5-S1 degenerative changes of the imaged spine. No acute or aggressive appearing osseous lesion.  IMPRESSION: Acute appendicitis.   Original Report Authenticated By: Waneta Martins, M.D.     Medications:    . allopurinol  300 mg Oral Daily  . amLODipine  2.5 mg Oral Daily  . atorvastatin  20 mg Oral Daily  . buPROPion  300 mg Oral Daily  . chlorhexidine  15 mL Mouth/Throat BID  . enoxaparin (LOVENOX) injection  40 mg Subcutaneous Q24H  . ketorolac  30 mg Intravenous Q6H  . DISCONTD: amLODipine  5 mg Oral Daily  . DISCONTD: cephALEXin  500 mg Oral QID  . DISCONTD: enoxaparin (LOVENOX) injection  40 mg Subcutaneous Q24H  Assessment/Plan Acute appendicitis without mention of peritonitis, s/p Laparoscopic Appendectomy, Laparoscopic Appendectomy 04/28/2012. Gout  Hyperlipidemia  ADD (attention deficit disorder)  Arthritis  GERD (gastroesophageal reflux disease)      .  Plan:   1. NPO for now, except meds, few ice chips. 2. Abdominal films to r/o ileus 3. Mobilize 4. Continue IVF        LOS: 2 days    Tsering Leaman 04/29/2012

## 2012-04-29 NOTE — Progress Notes (Signed)
[facility] NP JDuanne in room when I entered - numerous complaints/concerns voiced by pt Convinced battery on IV pump will run out so does not want to use sink.  Wants Purell at his bedside Walked to Xray - refused a wheelchair for transport Does not like the bathroom Says the chair in the room will not work right Called his daughter "a physical therapist doctor" whom agreed his abdomen was too bloated Pt with flatus - annoyed that he did it in front of his wife Annoyed that he has not had a BM yet Wants his cephalexin PO again for his tooth, does not think the periop ABx matter Convinced his BP is wrong  General: Pt awake/alert/oriented x4 in no major acute distress Eyes: PERRL, normal EOM. Sclera nonicteric Neuro: CN II-XII intact w/o focal sensory/motor deficits. Lymph: No head/neck/groin lymphadenopathy Psych:  No delerium/psychosis/paranoia.  Interrupting / interjecting often.  No pressured speech.  Not belligerent but blunt and contrary HENT: Normocephalic, Mucus membranes moist.  No thrush Neck: Supple, No tracheal deviation Chest: No pain.  Good respiratory excursion. CV:  Pulses intact.  Regular rhythm Abdomen: Soft, Mildly distended at most.  Min tender at incisions.  No peritonitis.  No incarcerated hernias. Ext:  SCDs BLE.  No significant edema.  No cyanosis Skin: No petechiae / purpurae  I stressed to him that he required emergent surgery for appendicitis to save his life.  He thinks he's had the appendicitis for months, although that is not likely.  I noted it is common for the bowels not to work normally right away.  He had x-rays today which showed a moderate stool burden on the right side and constipation may be a contributor.  He notes he's not had an issue before.  I noted emergency surgery can change things for a while.   We offered milk of magnesia.  He tried one dose.  No bowel movement yet.  We'll start him on some bowel regimen to see if that helps him out.  Because he's had  flatus and his abdomen is not severely distended, it is reasonable to try solid diet and see how he does.  I think he is a ADHD and anxiety are contributing to these issues.  Hopefully as his ileus resolves & his home meds are restarted, things will improve & he can D/C from hospital in 1-2 days when patient meets criteria:  Tolerating oral intake well Ambulating in walkways Adequate pain control without IV medications Urinating  Having flatus

## 2012-04-29 NOTE — Progress Notes (Signed)
   CARE MANAGEMENT NOTE 04/29/2012  Patient:  RANBIR, CHEW   Account Number:  [id number]  Date Initiated:  04/29/2012  Documentation initiated by:  Jiles Crocker  Subjective/Objective Assessment:   ADMITTED WITH Acute appendicitis without mention of peritonitis     Action/Plan:   LIVES AT HOME WITH SPOUSE/ MOD INDEPENDENT PRIOR TO ADMISSION   Anticipated DC Date:  05/02/2012   Anticipated DC Plan:  HOME/SELF CARE      DC Planning Services  CM consult              Status of service:  In process, will continue to follow Medicare Important Message given?  NA - LOS <3 / Initial given by admissions (If response is "NO", the following Medicare IM given date fields will be blank)  Per UR Regulation:  Reviewed for med. necessity/level of care/duration of stay  Comments:  04/29/2012-  B Stiles Maxcy RN, BSN, MHA

## 2012-04-30 MED ORDER — CODEINE SULFATE 30 MG PO TABS: 30.0000 mg | ORAL_TABLET | ORAL | Status: AC | PRN

## 2012-04-30 MED ORDER — HYDROCODONE-ACETAMINOPHEN 5-325 MG PO TABS: 1.0000 | ORAL_TABLET | ORAL | Status: DC | PRN

## 2012-04-30 NOTE — Progress Notes (Signed)
Reviewed discharge paper work with patient

## 2012-04-30 NOTE — Discharge Summary (Signed)
Physician Discharge Summary [facility]  Patient ID: Johnathan Sosa MRN: [id number] DOB/AGE: 04-Jun-1946 66 y.o.  Admit date: 04/27/2012 Discharge date: 04/30/2012  Admission Diagnoses:  Acute appendicitis   Discharge Diagnoses:  Principal Problem:  *Acute appendicitis Active Problems:  ADD (attention deficit disorder)  GERD (gastroesophageal reflux disease)  HTN (hypertension)   Discharged Condition: good  Hospital Course: patient admitted from ER and underwent laparoscopic appendectomy by Dr. Donell Beers.  Post op course uncomplicated.  Received IV abx's.  Diet slowly advanced and tolerated.  Patient had BM yesterday and this AM.  Prepared for discharge home today, POD#2.  Consults: None  Significant Diagnostic Studies: AXR  Treatments: surgery: lap appendectomy  Discharge Exam: Blood pressure 143/93, pulse 77, temperature 98.1 F (36.7 C), temperature source Oral, resp. rate 16, height 6\' 3"  (1.905 m), weight 203 lb 4.2 oz (92.2 kg), SpO2 95.00%. HEENT - clear Neck - soft Chest - clear bilat Cor - RRR Abd - soft, scaphoid, BS present; wounds clear and dry with Dermabond, slight ecchymosis  Disposition: Home with family  Discharge Orders    Future Orders Please Complete By Expires   Diet - low sodium heart healthy      Increase activity slowly      Discharge instructions      Comments:   [facility] LAPAROSCOPIC SURGERY: POST OP INSTRUCTIONS  Always review your discharge instruction sheet given to you by the facility where your surgery was performed.  A prescription for pain medication may be given to you upon discharge.  Take your pain medication as prescribed.  If narcotic pain medicine is not needed, then you may take acetaminophen (Tylenol) or ibuprofen (Advil) as needed. Take your usually prescribed medications unless otherwise directed. If you need a refill on your pain medication, please contact your pharmacy.  They will  contact our office to request authorization. Prescriptions will not be filled after 5 P.M. or on weekends. You should follow a light diet the first few days after arrival home, such as soup and crackers or toast.  Be sure to include plenty of fluids daily. Most patients will experience some swelling and bruising in the area of the incisions.  Ice packs will help.  Swelling and bruising can take several days to resolve.  It is common to experience some constipation if taking pain medication after surgery.  Increasing fluid intake and taking a stool softener (such as Colace) will usually help or prevent this problem from occurring.  A mild laxative (Milk of Magnesia or Miralax) should be taken according to package instructions if there are no bowel movements after 48 hours. Unless discharge instructions indicate otherwise, you may remove your bandages 24-48 hours after surgery, and you may shower at that time.  You may have steri-strips (small skin tapes) in place directly over the incision.  These strips should be left on the skin for 7-10 days.  If your surgeon used skin glue on the incision, you may shower in 24 hours.  The glue will flake off over the next 2-3 weeks.  Any sutures or staples will be removed at the office during your follow-up visit. ACTIVITIES:  You may resume regular (light) daily activities beginning the next day-such as daily self-care, walking, climbing stairs-gradually increasing activities as tolerated.  You may have sexual intercourse when it is comfortable.  Refrain from any heavy lifting or straining until approved by your doctor. You may drive when you are no longer taking prescription pain  medication, you can comfortably wear a seatbelt, and you can safely maneuver your car and apply brakes. You should see your doctor in the office for a follow-up appointment approximately 2-3 weeks after your surgery.  Make sure that you call for this appointment within a day or two after you  arrive home to insure a convenient appointment time.  WHEN TO CALL YOUR DOCTOR: Fever over 101.0 Inability to urinate Continued bleeding from incision Increased pain, redness, or drainage from the incision Increasing abdominal pain  The clinic staff is available to answer your questions during regular business hours.  Please don't hesitate to call and ask to speak to one of the nurses for clinical concerns.  If you have a medical emergency, go to the nearest emergency room or call 911.  A surgeon from [facility] is always on call for the hospital. [phone] ? [phone] ? FAX [phone] Web site: [email or web address]   No dressing needed        Medication List  As of 04/30/2012  9:12 AM   TAKE these medications         acetaminophen 325 MG tablet   Commonly known as: TYLENOL   Take 2 tablets (650 mg total) by mouth every 6 (six) hours as needed for pain.      allopurinol 300 MG tablet   Commonly known as: ZYLOPRIM   Take 300 mg by mouth daily.      amLODipine 5 MG tablet   Commonly known as: NORVASC   Take 5 mg by mouth daily.      atorvastatin 20 MG tablet   Commonly known as: LIPITOR   Take 20 mg by mouth daily.      buPROPion 300 MG 24 hr tablet   Commonly known as: WELLBUTRIN XL   Take 300 mg by mouth daily.      cephALEXin 500 MG capsule   Commonly known as: KEFLEX   Take 500 mg by mouth 4 (four) times daily. Started on  04-20-12 for a 10 day supply. Pt's on day 8 of therapy      chlorhexidine 0.12 % solution   Commonly known as: PERIDEX   Use as directed 15 mLs in the mouth or throat 2 (two) times daily.      clonazePAM 1 MG tablet   Commonly known as: KLONOPIN   Take 1 mg by mouth at bedtime as needed. Takes one half tablet      CODEINE SULFATE PO   Take by mouth.      HYDROcodone-acetaminophen 5-325 MG per tablet   Commonly known as: NORCO/VICODIN   Take 1-2 tablets by mouth every 4 (four) hours as needed for pain.        ibuprofen 200 MG tablet   Commonly known as: ADVIL,MOTRIN   You can take 2-3 tablets every 6 hours as needed for pain.      multivitamin tablet   Take 1 tablet by mouth daily.      oxyCODONE-acetaminophen 5-325 MG per tablet   Commonly known as: PERCOCET/ROXICET   Take 1-2 tablets by mouth every 4 (four) hours as needed.      PRESCRIPTION MEDICATION           Follow-up Information    Follow up with [facility], MD. Schedule an appointment as soon as possible for a visit in 2 weeks. (Make an appointment for 2-3 weeks. Call if you have a problem before that time.)    Contact information:  [address] [place] [address] [phone]       Call Mickie Hillier, MD. (Follow up with Dr. Clarene Duke as needed)    Contact information:   [address] [place] [address] [phone]         Velora Heckler, MD, [facility] Office: [phone]    Signed: Velora Heckler 04/30/2012, 9:12 AM

## 2012-04-30 NOTE — Progress Notes (Signed)
Per night shift, patient refers minimal interruptions.  Patient sleeping.

## 2012-04-30 NOTE — Progress Notes (Signed)
RN Dawn called  Pt's family refuses other narcotics & insists on codeine - Rx sent Pt arguing over date of admission in system as 8/29 - asked RN to tell pt to d/w Hospital administrator.  Pt arrived in ED 8/28 night

## 2012-05-03 ENCOUNTER — Telehealth (INDEPENDENT_AMBULATORY_CARE_PROVIDER_SITE_OTHER): Payer: Self-pay

## 2012-05-03 NOTE — Progress Notes (Signed)
Discharge summary sent to payer through [facility]  

## 2012-05-03 NOTE — Telephone Encounter (Signed)
Spoke with the patient this morning who told me he did not need any pain medication.  He stated his wife did not want him to have the Oxycodone, but he would have been fine with it.  He has been able to control his discomfort with Ibuprofen, but will call us if he has any problems.

## 2012-06-01 ENCOUNTER — Encounter (INDEPENDENT_AMBULATORY_CARE_PROVIDER_SITE_OTHER): Payer: Self-pay | Admitting: General Surgery

## 2012-06-01 ENCOUNTER — Ambulatory Visit (INDEPENDENT_AMBULATORY_CARE_PROVIDER_SITE_OTHER): Payer: BC Managed Care – PPO | Admitting: General Surgery

## 2012-06-01 VITALS — BP 136/82 | HR 82 | Temp 98.9°F | Ht 75.0 in | Wt 209.0 lb

## 2012-06-01 DIAGNOSIS — K3580 Unspecified acute appendicitis: Secondary | ICD-10-CM

## 2012-06-01 NOTE — Assessment & Plan Note (Addendum)
No apparent complications.  Doing well.    Follow up as needed.

## 2012-06-01 NOTE — Patient Instructions (Signed)
Follow up as needed

## 2012-06-02 ENCOUNTER — Encounter (INDEPENDENT_AMBULATORY_CARE_PROVIDER_SITE_OTHER): Payer: BC Managed Care – PPO | Admitting: General Surgery
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
## 2012-06-13 ENCOUNTER — Encounter (INDEPENDENT_AMBULATORY_CARE_PROVIDER_SITE_OTHER): Payer: BC Managed Care – PPO | Admitting: General Surgery

## 2012-07-04 ENCOUNTER — Emergency Department (HOSPITAL_COMMUNITY): Payer: BC Managed Care – PPO

## 2012-07-04 ENCOUNTER — Emergency Department (HOSPITAL_COMMUNITY)
Admission: EM | Admit: 2012-07-04 | Discharge: 2012-07-05 | Disposition: A | Discharge status: Home / Self Care | Payer: BC Managed Care – PPO | Attending: Emergency Medicine | Admitting: Emergency Medicine

## 2012-07-04 ENCOUNTER — Encounter (HOSPITAL_COMMUNITY): Payer: Self-pay | Admitting: *Deleted

## 2012-07-04 DIAGNOSIS — I10 Essential (primary) hypertension: Secondary | ICD-10-CM | POA: Insufficient documentation

## 2012-07-04 DIAGNOSIS — Z87891 Personal history of nicotine dependence: Secondary | ICD-10-CM | POA: Insufficient documentation

## 2012-07-04 DIAGNOSIS — M129 Arthropathy, unspecified: Secondary | ICD-10-CM | POA: Insufficient documentation

## 2012-07-04 DIAGNOSIS — S3981XA Other specified injuries of abdomen, initial encounter: Secondary | ICD-10-CM | POA: Insufficient documentation

## 2012-07-04 DIAGNOSIS — F988 Other specified behavioral and emotional disorders with onset usually occurring in childhood and adolescence: Secondary | ICD-10-CM | POA: Insufficient documentation

## 2012-07-04 DIAGNOSIS — R079 Chest pain, unspecified: Secondary | ICD-10-CM

## 2012-07-04 DIAGNOSIS — E785 Hyperlipidemia, unspecified: Secondary | ICD-10-CM | POA: Insufficient documentation

## 2012-07-04 DIAGNOSIS — S298XXA Other specified injuries of thorax, initial encounter: Secondary | ICD-10-CM | POA: Insufficient documentation

## 2012-07-04 DIAGNOSIS — K219 Gastro-esophageal reflux disease without esophagitis: Secondary | ICD-10-CM | POA: Insufficient documentation

## 2012-07-04 DIAGNOSIS — Z79899 Other long term (current) drug therapy: Secondary | ICD-10-CM | POA: Insufficient documentation

## 2012-07-04 DIAGNOSIS — Y9389 Activity, other specified: Secondary | ICD-10-CM | POA: Insufficient documentation

## 2012-07-04 DIAGNOSIS — Y929 Unspecified place or not applicable: Secondary | ICD-10-CM | POA: Insufficient documentation

## 2012-07-04 DIAGNOSIS — R109 Unspecified abdominal pain: Secondary | ICD-10-CM

## 2012-07-04 DIAGNOSIS — M109 Gout, unspecified: Secondary | ICD-10-CM | POA: Insufficient documentation

## 2012-07-04 MED ORDER — HYDROMORPHONE HCL PF 1 MG/ML IJ SOLN
1.0000 mg | Freq: Once | INTRAMUSCULAR | Status: DC
  Filled 2012-07-04: qty 1
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
## 2012-07-05 ENCOUNTER — Emergency Department (HOSPITAL_COMMUNITY): Payer: BC Managed Care – PPO

## 2012-07-05 LAB — BASIC METABOLIC PANEL
GFR calc Af Amer: 89 mL/min — ABNORMAL LOW (ref >90)
GFR calc non Af Amer: 77 mL/min — ABNORMAL LOW (ref >90)
Glucose, Bld: 111 mg/dL — ABNORMAL HIGH (ref 70–99)
Potassium: 4.1 mEq/L (ref 3.5–5.1)
Sodium: 139 mEq/L (ref 135–145)

## 2012-07-05 LAB — CBC
Hemoglobin: 15.6 g/dL (ref 13.0–17.0)
MCHC: 36.6 g/dL — ABNORMAL HIGH (ref 30.0–36.0)
Platelets: 199 10*3/uL (ref 150–400)
RDW: 13 % (ref 11.5–15.5)

## 2012-07-05 MED ORDER — OXYCODONE-ACETAMINOPHEN 5-325 MG PO TABS: 1.0000 | ORAL_TABLET | ORAL | Status: DC | PRN

## 2012-07-05 MED ORDER — MORPHINE SULFATE 2 MG/ML IJ SOLN
2.0000 mg | Freq: Once | INTRAMUSCULAR | Status: AC
  Administered 2012-07-05: 2 mg via INTRAVENOUS
  Filled 2012-07-05: qty 1

## 2012-07-05 MED ORDER — IOHEXOL 300 MG/ML  SOLN
100.0000 mL | Freq: Once | INTRAMUSCULAR | Status: AC | PRN
  Administered 2012-07-05: 100 mL via INTRAVENOUS

## 2012-07-05 MED ORDER — MORPHINE SULFATE 4 MG/ML IJ SOLN
6.0000 mg | Freq: Once | INTRAMUSCULAR | Status: AC
  Administered 2012-07-05: 6 mg via INTRAVENOUS
  Filled 2012-07-05: qty 2

## 2012-07-10 ENCOUNTER — Emergency Department (HOSPITAL_COMMUNITY)
Admission: EM | Admit: 2012-07-10 | Discharge: 2012-07-10 | Disposition: A | Discharge status: Home / Self Care | Payer: BC Managed Care – PPO | Attending: Emergency Medicine | Admitting: Emergency Medicine

## 2012-07-10 ENCOUNTER — Emergency Department (HOSPITAL_COMMUNITY): Payer: BC Managed Care – PPO

## 2012-07-10 ENCOUNTER — Encounter (HOSPITAL_COMMUNITY): Payer: Self-pay | Admitting: Emergency Medicine

## 2012-07-10 DIAGNOSIS — E785 Hyperlipidemia, unspecified: Secondary | ICD-10-CM | POA: Insufficient documentation

## 2012-07-10 DIAGNOSIS — Z79899 Other long term (current) drug therapy: Secondary | ICD-10-CM | POA: Insufficient documentation

## 2012-07-10 DIAGNOSIS — M109 Gout, unspecified: Secondary | ICD-10-CM | POA: Insufficient documentation

## 2012-07-10 DIAGNOSIS — Z8679 Personal history of other diseases of the circulatory system: Secondary | ICD-10-CM | POA: Insufficient documentation

## 2012-07-10 DIAGNOSIS — R0789 Other chest pain: Secondary | ICD-10-CM

## 2012-07-10 DIAGNOSIS — R071 Chest pain on breathing: Secondary | ICD-10-CM | POA: Insufficient documentation

## 2012-07-10 DIAGNOSIS — I10 Essential (primary) hypertension: Secondary | ICD-10-CM | POA: Insufficient documentation

## 2012-07-10 DIAGNOSIS — K219 Gastro-esophageal reflux disease without esophagitis: Secondary | ICD-10-CM | POA: Insufficient documentation

## 2012-07-10 DIAGNOSIS — F988 Other specified behavioral and emotional disorders with onset usually occurring in childhood and adolescence: Secondary | ICD-10-CM | POA: Insufficient documentation

## 2012-07-10 DIAGNOSIS — M129 Arthropathy, unspecified: Secondary | ICD-10-CM | POA: Insufficient documentation

## 2012-07-10 DIAGNOSIS — Z87891 Personal history of nicotine dependence: Secondary | ICD-10-CM | POA: Insufficient documentation

## 2012-07-10 LAB — GLUCOSE, CAPILLARY: Glucose-Capillary: 86 mg/dL (ref 70–99)

## 2012-07-10 NOTE — ED Notes (Signed)
Discharge instructions given
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
## 2012-07-10 NOTE — ED Notes (Signed)
Pt reports severe pain with inspiration, coughing and sneezing
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
## 2013-05-17 DIAGNOSIS — Z1329 Encounter for screening for other suspected endocrine disorder: Secondary | ICD-10-CM | POA: Diagnosis not present

## 2013-05-17 DIAGNOSIS — Z13828 Encounter for screening for other musculoskeletal disorder: Secondary | ICD-10-CM | POA: Diagnosis not present

## 2013-05-17 DIAGNOSIS — Z131 Encounter for screening for diabetes mellitus: Secondary | ICD-10-CM | POA: Diagnosis not present

## 2013-06-03 DIAGNOSIS — Z23 Encounter for immunization: Secondary | ICD-10-CM | POA: Diagnosis not present

## 2013-09-07 DIAGNOSIS — R197 Diarrhea, unspecified: Secondary | ICD-10-CM | POA: Diagnosis not present

## 2013-10-11 DIAGNOSIS — R197 Diarrhea, unspecified: Secondary | ICD-10-CM | POA: Diagnosis not present

## 2013-10-16 DIAGNOSIS — D485 Neoplasm of uncertain behavior of skin: Secondary | ICD-10-CM | POA: Diagnosis not present

## 2014-03-15 DIAGNOSIS — Z23 Encounter for immunization: Secondary | ICD-10-CM | POA: Diagnosis not present

## 2014-03-15 DIAGNOSIS — M109 Gout, unspecified: Secondary | ICD-10-CM | POA: Diagnosis not present

## 2014-03-15 DIAGNOSIS — Z125 Encounter for screening for malignant neoplasm of prostate: Secondary | ICD-10-CM | POA: Diagnosis not present

## 2014-03-15 DIAGNOSIS — I10 Essential (primary) hypertension: Secondary | ICD-10-CM | POA: Diagnosis not present

## 2014-03-15 DIAGNOSIS — F411 Generalized anxiety disorder: Secondary | ICD-10-CM | POA: Diagnosis not present

## 2014-03-15 DIAGNOSIS — E785 Hyperlipidemia, unspecified: Secondary | ICD-10-CM | POA: Diagnosis not present

## 2014-03-15 DIAGNOSIS — Z0000 Encounter for general adult medical examination without abnormal findings: Secondary | ICD-10-CM | POA: Diagnosis not present

## 2014-03-15 DIAGNOSIS — R82998 Other abnormal findings in urine: Secondary | ICD-10-CM | POA: Diagnosis not present

## 2014-03-20 DIAGNOSIS — M1A00X0 Idiopathic chronic gout, unspecified site, without tophus (tophi): Secondary | ICD-10-CM | POA: Diagnosis not present

## 2014-03-20 DIAGNOSIS — M159 Polyosteoarthritis, unspecified: Secondary | ICD-10-CM | POA: Diagnosis not present

## 2014-04-06 DIAGNOSIS — M79609 Pain in unspecified limb: Secondary | ICD-10-CM | POA: Diagnosis not present

## 2014-06-14 DIAGNOSIS — Z23 Encounter for immunization: Secondary | ICD-10-CM | POA: Diagnosis not present

## 2014-10-18 DIAGNOSIS — I10 Essential (primary) hypertension: Secondary | ICD-10-CM | POA: Diagnosis not present

## 2014-10-18 DIAGNOSIS — R413 Other amnesia: Secondary | ICD-10-CM | POA: Diagnosis not present

## 2014-10-18 LAB — CBC AND DIFFERENTIAL
HCT: 46 % (ref 41–53)
Hemoglobin: 15.5 g/dL (ref 13.5–17.5)
Platelets: 212 10*3/uL (ref 150–399)
WBC: 9.4 10^3/mL

## 2014-10-18 LAB — HEPATIC FUNCTION PANEL
ALT: 25 U/L (ref 10–40)
AST: 18 U/L (ref 14–40)

## 2014-10-18 LAB — BASIC METABOLIC PANEL
BUN: 14 mg/dL (ref 4–21)
CREATININE: 1.1 mg/dL (ref 0.6–1.3)
Glucose: 93 mg/dL
POTASSIUM: 4 mmol/L (ref 3.4–5.3)
SODIUM: 138 mmol/L (ref 137–147)

## 2014-10-18 LAB — TSH: TSH: 1.2 u[IU]/mL (ref 0.41–5.90)

## 2014-10-19 ENCOUNTER — Other Ambulatory Visit: Payer: Self-pay | Admitting: Family Medicine

## 2014-10-19 DIAGNOSIS — R413 Other amnesia: Secondary | ICD-10-CM

## 2014-10-22 ENCOUNTER — Ambulatory Visit
Admission: RE | Admit: 2014-10-22 | Discharge: 2014-10-22 | Disposition: A | Discharge status: Home / Self Care | Payer: Medicare Other | Source: Ambulatory Visit | Attending: Family Medicine | Admitting: Family Medicine

## 2014-10-22 DIAGNOSIS — R55 Syncope and collapse: Secondary | ICD-10-CM | POA: Diagnosis not present

## 2014-10-22 DIAGNOSIS — R413 Other amnesia: Secondary | ICD-10-CM

## 2014-10-22 DIAGNOSIS — R51 Headache: Secondary | ICD-10-CM | POA: Diagnosis not present

## 2014-10-22 MED ORDER — IOHEXOL 300 MG/ML  SOLN
75.0000 mL | Freq: Once | INTRAMUSCULAR | Status: AC | PRN
  Administered 2014-10-22: 75 mL via INTRAVENOUS

## 2014-10-24 ENCOUNTER — Ambulatory Visit (INDEPENDENT_AMBULATORY_CARE_PROVIDER_SITE_OTHER): Payer: Medicare Other | Admitting: Neurology

## 2014-10-24 ENCOUNTER — Encounter: Payer: Self-pay | Admitting: Neurology

## 2014-10-24 VITALS — BP 147/95 | HR 82 | Resp 17 | Ht 73.0 in | Wt 200.0 lb

## 2014-10-24 DIAGNOSIS — G3184 Mild cognitive impairment, so stated: Secondary | ICD-10-CM | POA: Diagnosis not present

## 2014-10-24 DIAGNOSIS — R42 Dizziness and giddiness: Secondary | ICD-10-CM | POA: Diagnosis not present

## 2014-10-24 DIAGNOSIS — M109 Gout, unspecified: Secondary | ICD-10-CM | POA: Insufficient documentation

## 2014-10-24 DIAGNOSIS — S06891S Other specified intracranial injury with loss of consciousness of 30 minutes or less, sequela: Secondary | ICD-10-CM | POA: Diagnosis not present

## 2014-10-24 DIAGNOSIS — R413 Other amnesia: Secondary | ICD-10-CM

## 2014-10-24 DIAGNOSIS — F329 Major depressive disorder, single episode, unspecified: Secondary | ICD-10-CM

## 2014-10-24 DIAGNOSIS — S069X1S Unspecified intracranial injury with loss of consciousness of 30 minutes or less, sequela: Secondary | ICD-10-CM

## 2014-10-24 DIAGNOSIS — F325 Major depressive disorder, single episode, in full remission: Secondary | ICD-10-CM | POA: Insufficient documentation

## 2014-10-24 DIAGNOSIS — S069XAA Unspecified intracranial injury with loss of consciousness status unknown, initial encounter: Secondary | ICD-10-CM | POA: Insufficient documentation

## 2014-10-24 DIAGNOSIS — G4700 Insomnia, unspecified: Secondary | ICD-10-CM

## 2014-10-24 DIAGNOSIS — F32A Depression, unspecified: Secondary | ICD-10-CM

## 2014-10-24 DIAGNOSIS — H519 Unspecified disorder of binocular movement: Secondary | ICD-10-CM | POA: Diagnosis not present

## 2014-10-24 DIAGNOSIS — E785 Hyperlipidemia, unspecified: Secondary | ICD-10-CM

## 2014-10-24 DIAGNOSIS — R404 Transient alteration of awareness: Secondary | ICD-10-CM | POA: Insufficient documentation

## 2014-10-24 DIAGNOSIS — F324 Major depressive disorder, single episode, in partial remission: Secondary | ICD-10-CM | POA: Insufficient documentation

## 2014-10-24 DIAGNOSIS — F988 Other specified behavioral and emotional disorders with onset usually occurring in childhood and adolescence: Secondary | ICD-10-CM

## 2014-10-24 DIAGNOSIS — F419 Anxiety disorder, unspecified: Secondary | ICD-10-CM | POA: Insufficient documentation

## 2014-10-24 DIAGNOSIS — S069X9A Unspecified intracranial injury with loss of consciousness of unspecified duration, initial encounter: Secondary | ICD-10-CM

## 2014-10-24 DIAGNOSIS — K219 Gastro-esophageal reflux disease without esophagitis: Secondary | ICD-10-CM | POA: Insufficient documentation

## 2014-10-24 DIAGNOSIS — R4789 Other speech disturbances: Secondary | ICD-10-CM | POA: Insufficient documentation

## 2014-10-24 HISTORY — DX: Unspecified intracranial injury with loss of consciousness of unspecified duration, initial encounter: S06.9X9A

## 2014-10-24 HISTORY — DX: Unspecified intracranial injury with loss of consciousness status unknown, initial encounter: S06.9XAA

## 2014-10-24 NOTE — Patient Instructions (Signed)
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
## 2014-10-26 ENCOUNTER — Ambulatory Visit
Admission: RE | Admit: 2014-10-26 | Discharge: 2014-10-26 | Disposition: A | Discharge status: Home / Self Care | Payer: Medicare Other | Source: Ambulatory Visit | Attending: Neurology | Admitting: Neurology

## 2014-10-26 DIAGNOSIS — S069X1S Unspecified intracranial injury with loss of consciousness of 30 minutes or less, sequela: Secondary | ICD-10-CM

## 2014-10-26 DIAGNOSIS — I679 Cerebrovascular disease, unspecified: Secondary | ICD-10-CM

## 2014-10-26 DIAGNOSIS — H519 Unspecified disorder of binocular movement: Secondary | ICD-10-CM

## 2014-10-26 DIAGNOSIS — S06891S Other specified intracranial injury with loss of consciousness of 30 minutes or less, sequela: Secondary | ICD-10-CM | POA: Diagnosis not present

## 2014-10-26 DIAGNOSIS — R42 Dizziness and giddiness: Secondary | ICD-10-CM

## 2014-10-26 DIAGNOSIS — G3184 Mild cognitive impairment, so stated: Secondary | ICD-10-CM

## 2014-10-26 DIAGNOSIS — R4789 Other speech disturbances: Secondary | ICD-10-CM

## 2014-10-26 MED ORDER — GADOBENATE DIMEGLUMINE 529 MG/ML IV SOLN
19.0000 mL | Freq: Once | INTRAVENOUS | Status: AC | PRN
  Administered 2014-10-26: 19 mL via INTRAVENOUS

## 2014-10-30 ENCOUNTER — Ambulatory Visit (INDEPENDENT_AMBULATORY_CARE_PROVIDER_SITE_OTHER): Payer: Medicare Other | Admitting: Neurology

## 2014-10-30 DIAGNOSIS — S069X1S Unspecified intracranial injury with loss of consciousness of 30 minutes or less, sequela: Secondary | ICD-10-CM

## 2014-10-30 DIAGNOSIS — S06891S Other specified intracranial injury with loss of consciousness of 30 minutes or less, sequela: Secondary | ICD-10-CM

## 2014-10-30 DIAGNOSIS — R4789 Other speech disturbances: Secondary | ICD-10-CM

## 2014-10-30 DIAGNOSIS — G3184 Mild cognitive impairment, so stated: Secondary | ICD-10-CM

## 2014-10-30 DIAGNOSIS — H519 Unspecified disorder of binocular movement: Secondary | ICD-10-CM

## 2014-10-30 DIAGNOSIS — R42 Dizziness and giddiness: Secondary | ICD-10-CM

## 2014-10-30 DIAGNOSIS — I679 Cerebrovascular disease, unspecified: Secondary | ICD-10-CM

## 2014-10-30 NOTE — Progress Notes (Signed)
Quick Note:  I called and spoke to pt and relayed the results of MRI., MRA. Pt to be here for EEG at 1115. Still in work up stage. Will follow up all test done. ______

## 2014-10-30 NOTE — Progress Notes (Signed)
Quick Note:  Pt notified to test results. ______

## 2014-11-01 ENCOUNTER — Ambulatory Visit: Payer: Medicare Other | Attending: Neurology

## 2014-11-01 DIAGNOSIS — R42 Dizziness and giddiness: Secondary | ICD-10-CM | POA: Insufficient documentation

## 2014-11-01 DIAGNOSIS — R482 Apraxia: Secondary | ICD-10-CM

## 2014-11-01 DIAGNOSIS — R4701 Aphasia: Secondary | ICD-10-CM | POA: Diagnosis not present
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
## 2014-11-02 ENCOUNTER — Ambulatory Visit: Payer: Medicare Other | Admitting: Rehabilitative and Restorative Service Providers"

## 2014-11-02 DIAGNOSIS — R42 Dizziness and giddiness: Secondary | ICD-10-CM

## 2014-11-02 DIAGNOSIS — R4701 Aphasia: Secondary | ICD-10-CM | POA: Diagnosis not present

## 2014-11-02 DIAGNOSIS — R482 Apraxia: Secondary | ICD-10-CM | POA: Diagnosis not present

## 2014-11-02 NOTE — Patient Instructions (Addendum)
Feet Partial Heel-Toe, Varied Arm Positions - Eyes Closed   Stand with right foot partially in front of the other and arms out. Close eyes and visualize upright position. Hold _30___ seconds. Repeat _3___ times per session. Do __2__ sessions per day.  Copyright  VHI. All rights reserved.   Progress to moving feet heel to toe.  Diagonals   Sitting, slowly bring head down with nose in direction of right knee and return to midline (regular sitting). Hold until symptoms subside. Repeat __5__ times per session. Do __2__ sessions per day.  Copyright  VHI. All rights reserved.
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
## 2014-11-05 ENCOUNTER — Telehealth: Payer: Self-pay | Admitting: *Deleted

## 2014-11-05 ENCOUNTER — Ambulatory Visit: Payer: Medicare Other

## 2014-11-05 DIAGNOSIS — R4701 Aphasia: Secondary | ICD-10-CM

## 2014-11-05 DIAGNOSIS — R482 Apraxia: Secondary | ICD-10-CM | POA: Diagnosis not present

## 2014-11-05 DIAGNOSIS — R42 Dizziness and giddiness: Secondary | ICD-10-CM | POA: Diagnosis not present

## 2014-11-05 NOTE — Telephone Encounter (Signed)
Normal EEG, CD
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
## 2014-11-05 NOTE — Patient Instructions (Signed)
  Please complete the assigned speech therapy homework for 45 minutes per day.

## 2014-11-06 NOTE — Telephone Encounter (Signed)
I called and relayed via VM that EEG results normal.  Pt to call back if questions.

## 2014-11-07 ENCOUNTER — Encounter: Payer: Medicare Other | Admitting: Physical Therapy

## 2014-11-07 ENCOUNTER — Ambulatory Visit: Payer: Medicare Other | Admitting: Physical Therapy

## 2014-11-08 ENCOUNTER — Ambulatory Visit: Payer: Medicare Other

## 2014-11-08 ENCOUNTER — Telehealth: Payer: Self-pay

## 2014-11-08 DIAGNOSIS — R4701 Aphasia: Secondary | ICD-10-CM | POA: Diagnosis not present

## 2014-11-08 DIAGNOSIS — R42 Dizziness and giddiness: Secondary | ICD-10-CM | POA: Diagnosis not present

## 2014-11-08 DIAGNOSIS — R482 Apraxia: Secondary | ICD-10-CM | POA: Diagnosis not present
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
## 2014-11-08 NOTE — Telephone Encounter (Signed)
Left message for pt re: no show to speech therapy this morning and asked him to call the clinic to verify next appointment Monday 11-12-14 at 9:30.

## 2014-11-09 NOTE — Procedures (Signed)
[facility]  EEG (ELECTROENCEPHALOGRAM) REPORT   STUDY DATE:  10-30-14  PATIENT NAME: Johnathan Sosa  MRN:  This patient has 2: Medical record numbers , 1 with his middle name and 1 listed without.   ORDERING CLINICIAN: Larey Seat, MD   TECHNOLOGIST: Ronnald Ramp TECHNIQUE: Electroencephalogram was recorded utilizing standard 10-20 system of lead placement and reformatted into average and bipolar montages.      RECORDING TIME: 30.7 minutes ACTIVATION:  strobe lights and hyperventilation    CLINICAL INFORMATION:  The patient reported word finding difficulties, speech arrest. History of traumatic brain injury. 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   FINDINGS: This EEG documented a posterior dominant background rhythm of 10-11 Hz. The EEG is well formed symmetric and well organized.  Hyperventilation didn't lead to amplitude buildup but not slowing. There are brief periods of sleep noted - with K complexes. The patient does not remain asleep for long  eyeblink artifact with photic stimulation, most noticeable at 11 Hz. There is photic entrainment at 11 Hz as well. No epileptiform activity on rises. The EKG shows a normal sinus rhythm at 52 bpm.     IMPRESSION:  Normal EEG awake and asleep . Carbon copy to primary care physician       Larey Seat , MD

## 2014-11-12 ENCOUNTER — Ambulatory Visit: Payer: Medicare Other

## 2014-11-12 DIAGNOSIS — R4701 Aphasia: Secondary | ICD-10-CM

## 2014-11-12 DIAGNOSIS — R42 Dizziness and giddiness: Secondary | ICD-10-CM | POA: Diagnosis not present

## 2014-11-12 DIAGNOSIS — R482 Apraxia: Secondary | ICD-10-CM | POA: Diagnosis not present
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
## 2014-11-14 ENCOUNTER — Encounter: Payer: Medicare Other | Admitting: Rehabilitative and Restorative Service Providers"

## 2014-11-14 ENCOUNTER — Ambulatory Visit: Payer: Medicare Other | Admitting: Rehabilitative and Restorative Service Providers"

## 2014-11-14 DIAGNOSIS — R42 Dizziness and giddiness: Secondary | ICD-10-CM

## 2014-11-14 DIAGNOSIS — R4701 Aphasia: Secondary | ICD-10-CM | POA: Diagnosis not present

## 2014-11-14 DIAGNOSIS — R482 Apraxia: Secondary | ICD-10-CM | POA: Diagnosis not present
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
## 2014-11-20 ENCOUNTER — Ambulatory Visit: Payer: Medicare Other

## 2014-11-20 DIAGNOSIS — R4701 Aphasia: Secondary | ICD-10-CM

## 2014-11-20 DIAGNOSIS — R42 Dizziness and giddiness: Secondary | ICD-10-CM | POA: Diagnosis not present

## 2014-11-20 DIAGNOSIS — R482 Apraxia: Secondary | ICD-10-CM | POA: Diagnosis not present

## 2014-11-20 NOTE — Patient Instructions (Addendum)
Complete formal therapy tasks given today approx 15 minutes, twice a day  When you are discharged I will provide more tasks for you to complete at least x4/week for 20-30 minutes a day
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
## 2014-11-27 ENCOUNTER — Encounter: Payer: Medicare Other | Admitting: Speech Pathology

## 2014-12-05 ENCOUNTER — Ambulatory Visit: Payer: Medicare Other | Attending: Neurology | Admitting: Rehabilitative and Restorative Service Providers"

## 2014-12-05 DIAGNOSIS — R42 Dizziness and giddiness: Secondary | ICD-10-CM | POA: Insufficient documentation

## 2014-12-05 DIAGNOSIS — R4701 Aphasia: Secondary | ICD-10-CM | POA: Insufficient documentation

## 2014-12-05 DIAGNOSIS — R482 Apraxia: Secondary | ICD-10-CM | POA: Insufficient documentation

## 2014-12-05 DIAGNOSIS — R269 Unspecified abnormalities of gait and mobility: Secondary | ICD-10-CM
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
## 2014-12-05 NOTE — Patient Instructions (Signed)
Gaze Stabilization: Tip Card 1.Target must remain in focus, not blurry, and appear stationary while head is in motion. 2.Perform exercises with small head movements (45 to either side of midline). 3.Increase speed of head motion so long as target is in focus. 4.If you wear eyeglasses, be sure you can see target through lens (therapist will give specific instructions for bifocal / progressive lenses). 5.These exercises may provoke dizziness or nausea. Work through these symptoms. If too dizzy, slow head movement slightly. Rest between each exercise. 6.Exercises demand concentration; avoid distractions. 7.For safety, perform standing exercises close to a counter, wall, corner, or next to someone.  Copyright  VHI. All rights reserved.  Gaze Stabilization: Sitting   Keeping eyes on target on wall 3 feet away, tilt head down slightly and move head side to side for _30___ seconds. Repeat while moving head up and down for _30___ seconds. Do _2___ sessions per day.   Copyright  VHI. All rights reserved.

## 2014-12-06 ENCOUNTER — Ambulatory Visit: Payer: Medicare Other

## 2014-12-06 DIAGNOSIS — R42 Dizziness and giddiness: Secondary | ICD-10-CM | POA: Diagnosis not present

## 2014-12-06 DIAGNOSIS — R4701 Aphasia: Secondary | ICD-10-CM | POA: Diagnosis not present

## 2014-12-06 DIAGNOSIS — R482 Apraxia: Secondary | ICD-10-CM | POA: Diagnosis not present

## 2014-12-06 NOTE — Patient Instructions (Signed)
  Perform written language activities provided - maybe 1-2/day x3-5 days a week  Do language "games"/apps- Scrabble, crossword puzzles, Taboo, Scattergories, Words with Friends, etc.   iPad only app -[employer] - expressive language activities
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
## 2014-12-26 ENCOUNTER — Encounter: Payer: Self-pay | Admitting: Neurology

## 2014-12-26 ENCOUNTER — Ambulatory Visit: Payer: Medicare Other

## 2014-12-26 ENCOUNTER — Ambulatory Visit: Payer: Medicare Other | Admitting: Rehabilitative and Restorative Service Providers"

## 2014-12-26 ENCOUNTER — Ambulatory Visit (INDEPENDENT_AMBULATORY_CARE_PROVIDER_SITE_OTHER): Payer: Medicare Other | Admitting: Neurology

## 2014-12-26 VITALS — BP 126/86 | HR 66 | Temp 97.9°F | Resp 18 | Ht 73.0 in | Wt 199.0 lb

## 2014-12-26 DIAGNOSIS — S069X1A Unspecified intracranial injury with loss of consciousness of 30 minutes or less, initial encounter: Secondary | ICD-10-CM | POA: Diagnosis not present

## 2014-12-26 DIAGNOSIS — R42 Dizziness and giddiness: Secondary | ICD-10-CM

## 2014-12-26 DIAGNOSIS — R4701 Aphasia: Secondary | ICD-10-CM | POA: Diagnosis not present

## 2014-12-26 DIAGNOSIS — R482 Apraxia: Secondary | ICD-10-CM | POA: Diagnosis not present

## 2014-12-26 DIAGNOSIS — R269 Unspecified abnormalities of gait and mobility: Secondary | ICD-10-CM

## 2014-12-26 MED ORDER — ASPIRIN 75 MG PO CHEW: 75.0000 mg | CHEWABLE_TABLET | Freq: Every day | ORAL | Status: DC

## 2014-12-26 MED ORDER — DONEPEZIL HCL 5 MG PO TABS: 5.0000 mg | ORAL_TABLET | Freq: Two times a day (BID) | ORAL | Status: DC

## 2014-12-26 NOTE — Patient Instructions (Signed)
We will start Aricept 5 mg twice a day which is usually easier tolerated the 10 mg once a day as a brain booster. I think there is a mild cognitive impairment but this does not qualify for a dementia diagnosis and I explained this to the patient. EEG was normal with some sleepiness, the Montral cognitive assessment test is 2 points lower than last visit Mini-Mental Status Examination was not repeated MRI shows generalized cerebral atrophy also mild degree. MRA was upper was normal. I will refer to Dr. Peyton Bottoms for a neuropsychological battery with the idea of differentiating depression from attention problems memory problems awareness. I will also add a baby aspirin daily to the patient's medication indication regimen.

## 2014-12-26 NOTE — Patient Instructions (Signed)
Gaze Stabilization: Standing Feet Apart   Feet shoulder width apart, keeping eyes on target on wall __3__ feet away, tilt head down slightly and move head side to side for _30___ seconds. USE GLASSES Do __2__ sessions per day. Repeat using target on pattern background.  Copyright  VHI. All rights reserved.
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
## 2014-12-27 ENCOUNTER — Encounter: Payer: Self-pay | Admitting: Rehabilitative and Restorative Service Providers"

## 2014-12-27 NOTE — Therapy (Signed)
[facility] [address] [place], [place], [address] Phone: [phone]   Fax:  [phone]  Patient Details  Name: Marlo Goodrich MRN: [id number] Date of Birth: 05-29-1946 Referring Provider:  No ref. provider found  Encounter Date: 12/27/2014   PHYSICAL THERAPY DISCHARGE SUMMARY  Visits from Start of Care: 4  Current functional level related to goals / functional outcomes:     PT Long Term Goals - 12/26/14 1053    PT LONG TERM GOAL #1   Title The patient will be indep with HEP for habituation and high level balance.  Modified target date to 01/02/2015.   Baseline Goal met on 12/26/2014 *still provokes symptoms, recommend continuing   Time 4   Period Weeks   Status Achieved        Remaining deficits: Decreased high level balance, occasional episodes of dizziness.   Education / Equipment: HEP.  Plan: Patient agrees to discharge.  Patient goals were met. Patient is being discharged due to meeting the stated rehab goals.  ?????    Thank you for the referral of this patient.    [place], [place] 12/27/2014, 2:30 PM  [facility] [address] [place], [place], [address] Phone: [phone]   Fax:  [phone]

## 2015-01-21 DIAGNOSIS — H4422 Degenerative myopia, left eye: Secondary | ICD-10-CM | POA: Diagnosis not present

## 2015-01-21 DIAGNOSIS — H31092 Other chorioretinal scars, left eye: Secondary | ICD-10-CM | POA: Diagnosis not present

## 2015-01-21 DIAGNOSIS — H548 Legal blindness, as defined in USA: Secondary | ICD-10-CM | POA: Diagnosis not present

## 2015-02-14 ENCOUNTER — Telehealth: Payer: Self-pay | Admitting: Psychology

## 2015-02-14 NOTE — Telephone Encounter (Signed)
Since 01/02/15, my staff has has left messages for him three times and spoken to him once about scheduling neuropsychologicakl evaluation. On 02/13/15 he reportedly stated that he was not ready to schedule and asked to be recontacted in one month. He then left a message to speak to me.I called him back today. I explained the rationale and procedurses of neuropsych evaluation. He stated that he wants to first find out how much his insurance will cover before scheduling. I gave him the requisite information to check this out. He will call me back.

## 2015-03-21 DIAGNOSIS — M150 Primary generalized (osteo)arthritis: Secondary | ICD-10-CM | POA: Diagnosis not present

## 2015-03-21 DIAGNOSIS — L089 Local infection of the skin and subcutaneous tissue, unspecified: Secondary | ICD-10-CM | POA: Diagnosis not present

## 2015-03-21 DIAGNOSIS — M1A09X0 Idiopathic chronic gout, multiple sites, without tophus (tophi): Secondary | ICD-10-CM | POA: Diagnosis not present

## 2015-03-25 ENCOUNTER — Telehealth: Payer: Self-pay | Admitting: *Deleted

## 2015-03-25 ENCOUNTER — Telehealth: Payer: Self-pay | Admitting: Neurology

## 2015-03-25 DIAGNOSIS — S069X1A Unspecified intracranial injury with loss of consciousness of 30 minutes or less, initial encounter: Secondary | ICD-10-CM

## 2015-03-25 MED ORDER — DONEPEZIL HCL 5 MG PO TABS: 5.0000 mg | ORAL_TABLET | Freq: Two times a day (BID) | ORAL | Status: DC

## 2015-03-25 NOTE — Telephone Encounter (Signed)
Returned pt's call. He actually wanted to know that date and time of his appt with Dr. Valentina Shaggy. I informed him that it was at 9:00 on 9/13. Pt verbalized understanding. Pt also requesting a copy of his MR. He is requesting a phone call from our MR dept.

## 2015-03-25 NOTE — Telephone Encounter (Signed)
Patient called and requested a refill on Rx. donepezil (ARICEPT) 5 MG tablet. He would like to know if he can pick the written prescription up from our office. Please call and advise.

## 2015-03-25 NOTE — Telephone Encounter (Signed)
Patient called and requested to speak with the nurse regarding some questions he has about his diagnosis. Please call and advice.

## 2015-03-25 NOTE — Telephone Encounter (Signed)
Called patient, records at front desk for patient 03/25/15.

## 2015-03-25 NOTE — Telephone Encounter (Signed)
I called back to see if the patient would like the Rx sent directly to the pharmacy, so he does not have to make a trip to the office.  He was agreeable to this, and asked that the Rx be sent to [facility] [facility].

## 2015-03-27 DIAGNOSIS — Z0289 Encounter for other administrative examinations: Secondary | ICD-10-CM

## 2015-04-12 ENCOUNTER — Encounter (HOSPITAL_COMMUNITY): Payer: Self-pay | Admitting: Emergency Medicine

## 2015-05-14 ENCOUNTER — Ambulatory Visit: Payer: Medicare Other | Attending: Psychology | Admitting: Psychology

## 2015-05-14 DIAGNOSIS — R413 Other amnesia: Secondary | ICD-10-CM | POA: Diagnosis not present

## 2015-05-14 DIAGNOSIS — Z87820 Personal history of traumatic brain injury: Secondary | ICD-10-CM | POA: Diagnosis not present

## 2015-05-14 DIAGNOSIS — F909 Attention-deficit hyperactivity disorder, unspecified type: Secondary | ICD-10-CM | POA: Insufficient documentation

## 2015-05-14 DIAGNOSIS — R4789 Other speech disturbances: Secondary | ICD-10-CM | POA: Diagnosis not present
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
## 2015-05-15 ENCOUNTER — Ambulatory Visit (INDEPENDENT_AMBULATORY_CARE_PROVIDER_SITE_OTHER): Payer: Medicare Other | Admitting: Psychology

## 2015-05-15 ENCOUNTER — Encounter: Payer: Self-pay | Admitting: Psychology

## 2015-05-15 DIAGNOSIS — F909 Attention-deficit hyperactivity disorder, unspecified type: Secondary | ICD-10-CM

## 2015-05-15 DIAGNOSIS — F438 Other reactions to severe stress: Secondary | ICD-10-CM

## 2015-05-15 DIAGNOSIS — F988 Other specified behavioral and emotional disorders with onset usually occurring in childhood and adolescence: Secondary | ICD-10-CM

## 2015-05-15 DIAGNOSIS — Z87820 Personal history of traumatic brain injury: Secondary | ICD-10-CM

## 2015-05-15 DIAGNOSIS — F4389 Other reactions to severe stress: Secondary | ICD-10-CM

## 2015-05-15 DIAGNOSIS — R4789 Other speech disturbances: Secondary | ICD-10-CM | POA: Diagnosis not present

## 2015-05-15 DIAGNOSIS — R413 Other amnesia: Secondary | ICD-10-CM
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
## 2015-06-27 ENCOUNTER — Ambulatory Visit: Payer: Medicare Other | Admitting: Adult Health

## 2015-07-30 DIAGNOSIS — Z23 Encounter for immunization: Secondary | ICD-10-CM | POA: Diagnosis not present

## 2015-08-27 ENCOUNTER — Ambulatory Visit: Payer: Medicare Other | Admitting: Family Medicine

## 2015-09-01 HISTORY — PX: EYE SURGERY: SHX253

## 2015-09-20 ENCOUNTER — Ambulatory Visit: Payer: Medicare Other | Admitting: Family Medicine

## 2015-10-01 ENCOUNTER — Encounter: Payer: Self-pay | Admitting: Family Medicine

## 2015-10-01 ENCOUNTER — Ambulatory Visit (INDEPENDENT_AMBULATORY_CARE_PROVIDER_SITE_OTHER): Payer: Medicare Other | Admitting: Family Medicine

## 2015-10-01 ENCOUNTER — Ambulatory Visit: Payer: Medicare Other | Admitting: Family Medicine

## 2015-10-01 VITALS — BP 140/90 | HR 88 | Temp 99.1°F | Wt 209.0 lb

## 2015-10-01 DIAGNOSIS — G4700 Insomnia, unspecified: Secondary | ICD-10-CM | POA: Diagnosis not present

## 2015-10-01 DIAGNOSIS — Z23 Encounter for immunization: Secondary | ICD-10-CM

## 2015-10-01 DIAGNOSIS — I10 Essential (primary) hypertension: Secondary | ICD-10-CM

## 2015-10-01 DIAGNOSIS — E785 Hyperlipidemia, unspecified: Secondary | ICD-10-CM

## 2015-10-01 DIAGNOSIS — Z87891 Personal history of nicotine dependence: Secondary | ICD-10-CM | POA: Insufficient documentation

## 2015-10-01 DIAGNOSIS — M10079 Idiopathic gout, unspecified ankle and foot: Secondary | ICD-10-CM

## 2015-10-01 DIAGNOSIS — N529 Male erectile dysfunction, unspecified: Secondary | ICD-10-CM

## 2015-10-01 DIAGNOSIS — F32A Depression, unspecified: Secondary | ICD-10-CM

## 2015-10-01 DIAGNOSIS — G43909 Migraine, unspecified, not intractable, without status migrainosus: Secondary | ICD-10-CM | POA: Insufficient documentation

## 2015-10-01 DIAGNOSIS — F329 Major depressive disorder, single episode, unspecified: Secondary | ICD-10-CM

## 2015-10-01 MED ORDER — ATORVASTATIN CALCIUM 20 MG PO TABS: 20.0000 mg | ORAL_TABLET | Freq: Every day | ORAL | Status: DC

## 2015-10-01 MED ORDER — SILDENAFIL CITRATE 100 MG PO TABS: 100.0000 mg | ORAL_TABLET | Freq: Every day | ORAL | Status: DC | PRN

## 2015-10-01 MED ORDER — BUPROPION HCL ER (XL) 300 MG PO TB24: 300.0000 mg | ORAL_TABLET | Freq: Every day | ORAL | Status: DC

## 2015-10-01 MED ORDER — AMLODIPINE BESYLATE 5 MG PO TABS: 5.0000 mg | ORAL_TABLET | Freq: Every day | ORAL | Status: DC

## 2015-10-01 MED ORDER — ALLOPURINOL 300 MG PO TABS: 300.0000 mg | ORAL_TABLET | Freq: Every day | ORAL | Status: DC
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
## 2015-10-01 NOTE — Assessment & Plan Note (Signed)
S: on klonopin for years and years and has done well. Sometime half pill  A/P: continue Klonopin 1mg  qhs likely- he will bring by bottle to show rx as has been through mail order and not in database. Write 3 months while awaiting records after view bottle.

## 2015-10-01 NOTE — Assessment & Plan Note (Signed)
S: controlled poorly off Amlodipine 5mg   BP Readings from Last 3 Encounters:  10/01/15 140/90  12/26/14 126/86  10/24/14 147/95  A/P: restart amlodipine, 3 month follow up

## 2015-10-01 NOTE — Assessment & Plan Note (Signed)
S: apparently uric acid as high as 11 in past. Controlled on allopurinol 300mg  A/P: refilled- obtain records

## 2015-10-01 NOTE — Patient Instructions (Addendum)
Trial viagra. Start 1/2 tab.   Restart amlodipine as blood pressure high and did not solve erectile issue though some improved.   Bring by klonopin bottle. Can write for 1 month before we get records.   Sign release of information at the check out desk for records from Dr. Rex Kras with labs, imaging, office notes for 4 years  Refilled the below Meds ordered this encounter  Medications  . allopurinol (ZYLOPRIM) 300 MG tablet    Sig: Take 1 tablet (300 mg total) by mouth daily.    Dispense:  90 tablet    Refill:  3  . amLODipine (NORVASC) 5 MG tablet    Sig: Take 1 tablet (5 mg total) by mouth daily.    Dispense:  90 tablet    Refill:  3  . atorvastatin (LIPITOR) 20 MG tablet    Sig: Take 1 tablet (20 mg total) by mouth daily.    Dispense:  90 tablet    Refill:  3  . buPROPion (WELLBUTRIN XL) 300 MG 24 hr tablet    Sig: Take 1 tablet (300 mg total) by mouth daily.    Dispense:  90 tablet    Refill:  3  . sildenafil (VIAGRA) 100 MG tablet    Sig: Take 1 tablet (100 mg total) by mouth daily as needed for erectile dysfunction.    Dispense:  10 tablet    Refill:  5

## 2015-10-01 NOTE — Assessment & Plan Note (Signed)
S: difficulty with achieve, maintain, ejaculate A/P: trial viagra 50mg  at first- full 100mg  if needed

## 2015-10-01 NOTE — Assessment & Plan Note (Signed)
S: reports well controlled on atorvastatin 20mg . No myalgias. No records yet  A/P: obtain records for last lipids. Refilled atorvastatin

## 2015-10-01 NOTE — Assessment & Plan Note (Signed)
S: Mild anxiety as well- divorce has been hard on him. Reasonable control on Bupropion XL 300 mg. No Si/HI A/P: refilled bupropion- apparently has also helped with ADD

## 2015-10-03 MED ORDER — CLONAZEPAM 1 MG PO TABS: 1.0000 mg | ORAL_TABLET | Freq: Every day | ORAL | Status: DC

## 2015-10-03 NOTE — Addendum Note (Signed)
Addended by: Marin Olp on: 10/03/2015 10:36 AM   Modules accepted: Orders

## 2015-10-17 ENCOUNTER — Encounter: Payer: Self-pay | Admitting: Family Medicine

## 2015-10-18 ENCOUNTER — Encounter: Payer: Self-pay | Admitting: Family Medicine

## 2015-10-18 ENCOUNTER — Ambulatory Visit (INDEPENDENT_AMBULATORY_CARE_PROVIDER_SITE_OTHER): Payer: Medicare Other | Admitting: Family Medicine

## 2015-10-18 ENCOUNTER — Telehealth: Payer: Self-pay | Admitting: Family Medicine

## 2015-10-18 VITALS — BP 130/90 | HR 91 | Temp 98.8°F | Wt 210.0 lb

## 2015-10-18 DIAGNOSIS — R42 Dizziness and giddiness: Secondary | ICD-10-CM | POA: Diagnosis not present

## 2015-10-18 MED ORDER — PREDNISONE 20 MG PO TABS: ORAL_TABLET | ORAL | Status: DC

## 2015-10-18 MED ORDER — MECLIZINE HCL 25 MG PO TABS: 25.0000 mg | ORAL_TABLET | Freq: Three times a day (TID) | ORAL | Status: DC | PRN

## 2015-10-18 NOTE — Telephone Encounter (Signed)
Pt ?wife would like pt to see dr hunter today. Pt is having inner ear problems. Can I create slot?

## 2015-10-18 NOTE — Patient Instructions (Addendum)
I am concerned about vestibular neuritis with your exam and history Treat with prednisone for 7 days. Meclizine can be used as needed for vertigo/dizziness over next 48 hours  Follow up next Tuesday, Thursday or Friday at 11:15 (check out please open slot)  Continue aspirin 81mg . This would be change needed if this was a stroke or mini stroke. If you are having persistent symptoms not improving by next week would consider CT or MRI.

## 2015-10-18 NOTE — Telephone Encounter (Signed)
Meant to send to [facility]

## 2015-10-18 NOTE — Telephone Encounter (Signed)
Pt will be here at 440 pm
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
## 2015-10-18 NOTE — Telephone Encounter (Signed)
I can see one more patient if that is what is being requested. Book at the 4:15 but advise to come 4:40

## 2015-10-24 ENCOUNTER — Other Ambulatory Visit: Payer: Self-pay | Admitting: Family Medicine

## 2015-10-24 ENCOUNTER — Ambulatory Visit (INDEPENDENT_AMBULATORY_CARE_PROVIDER_SITE_OTHER): Payer: Medicare Other | Admitting: Family Medicine

## 2015-10-24 ENCOUNTER — Encounter: Payer: Self-pay | Admitting: Family Medicine

## 2015-10-24 VITALS — BP 128/78 | HR 90 | Temp 99.1°F | Wt 212.0 lb

## 2015-10-24 DIAGNOSIS — R42 Dizziness and giddiness: Secondary | ICD-10-CM

## 2015-10-24 DIAGNOSIS — M10079 Idiopathic gout, unspecified ankle and foot: Secondary | ICD-10-CM

## 2015-10-24 DIAGNOSIS — Z20828 Contact with and (suspected) exposure to other viral communicable diseases: Secondary | ICD-10-CM

## 2015-10-24 DIAGNOSIS — E785 Hyperlipidemia, unspecified: Secondary | ICD-10-CM

## 2015-10-24 DIAGNOSIS — I10 Essential (primary) hypertension: Secondary | ICD-10-CM | POA: Diagnosis not present

## 2015-10-24 LAB — COMPREHENSIVE METABOLIC PANEL
ALT: 30 U/L (ref 0–53)
AST: 17 U/L (ref 0–37)
Albumin: 4.4 g/dL (ref 3.5–5.2)
Alkaline Phosphatase: 59 U/L (ref 39–117)
BUN: 14 mg/dL (ref 6–23)
CHLORIDE: 106 meq/L (ref 96–112)
CO2: 30 meq/L (ref 19–32)
CREATININE: 1.18 mg/dL (ref 0.40–1.50)
Calcium: 9.9 mg/dL (ref 8.4–10.5)
GFR: 65.35 mL/min (ref >60.00)
GLUCOSE: 104 mg/dL — AB (ref 70–99)
Potassium: 4.5 mEq/L (ref 3.5–5.1)
SODIUM: 141 meq/L (ref 135–145)
Total Bilirubin: 0.8 mg/dL (ref 0.2–1.2)
Total Protein: 6.5 g/dL (ref 6.0–8.3)

## 2015-10-24 LAB — CBC WITH DIFFERENTIAL/PLATELET
BASOS PCT: 0.8 % (ref 0.0–3.0)
Basophils Absolute: 0.1 10*3/uL (ref 0.0–0.1)
EOS ABS: 0.7 10*3/uL (ref 0.0–0.7)
Eosinophils Relative: 7.3 % — ABNORMAL HIGH (ref 0.0–5.0)
HCT: 44.4 % (ref 39.0–52.0)
Hemoglobin: 15.2 g/dL (ref 13.0–17.0)
Lymphocytes Relative: 36.7 % (ref 12.0–46.0)
Lymphs Abs: 3.3 10*3/uL (ref 0.7–4.0)
MCHC: 34.2 g/dL (ref 30.0–36.0)
MCV: 88.9 fl (ref 78.0–100.0)
MONO ABS: 0.7 10*3/uL (ref 0.1–1.0)
Monocytes Relative: 7.3 % (ref 3.0–12.0)
NEUTROS ABS: 4.3 10*3/uL (ref 1.4–7.7)
NEUTROS PCT: 47.9 % (ref 43.0–77.0)
PLATELETS: 230 10*3/uL (ref 150.0–400.0)
RBC: 5 Mil/uL (ref 4.22–5.81)
RDW: 13.1 % (ref 11.5–15.5)
WBC: 9.1 10*3/uL (ref 4.0–10.5)

## 2015-10-24 LAB — LIPID PANEL
Cholesterol: 124 mg/dL (ref 0–200)
HDL: 38.5 mg/dL — AB (ref >39.00)
NONHDL: 85.71
Total CHOL/HDL Ratio: 3
Triglycerides: 245 mg/dL — ABNORMAL HIGH (ref 0.0–149.0)
VLDL: 49 mg/dL — AB (ref 0.0–40.0)

## 2015-10-24 LAB — LDL CHOLESTEROL, DIRECT: LDL DIRECT: 63 mg/dL

## 2015-10-24 LAB — URIC ACID: URIC ACID, SERUM: 5.3 mg/dL (ref 4.0–7.8)

## 2015-10-24 LAB — TSH: TSH: 1.1 u[IU]/mL (ref 0.35–4.50)

## 2015-10-24 NOTE — Assessment & Plan Note (Signed)
S: no recent flares on Allopurinol 300mg . Uric acid 2015 at 6.1 A/P: update uric acid today

## 2015-10-24 NOTE — Assessment & Plan Note (Signed)
S: controlled. On amlodipine 5mg   BP Readings from Last 3 Encounters:  10/24/15 128/78  10/18/15 130/90  10/01/15 140/90  A/P:Continue current medications- bp controlled on repeat
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
## 2015-10-24 NOTE — Assessment & Plan Note (Addendum)
S: well controlled on atorvastatin 20mg . No myalgias. With LDL 76 in 2015 A/P: repeat lipids today

## 2015-10-24 NOTE — Patient Instructions (Addendum)
Glad vertigo/dizziness episode is now gone.   You may resume working out.   Labs before you leave  You are a little dry on exam- try to drink at least 60 oz of water a day  They will reach out to [place] about the prior auth for viagra

## 2015-10-25 LAB — HEPATITIS C ANTIBODY: HCV Ab: NEGATIVE

## 2015-10-28 ENCOUNTER — Telehealth: Payer: Self-pay | Admitting: Family Medicine

## 2015-10-28 NOTE — Telephone Encounter (Signed)
Called and lm on pt vm with lab results. FYI regarding fever not sure what you wanted to about the low grade fever.

## 2015-10-28 NOTE — Telephone Encounter (Signed)
[name] called to ask if pt's labs were back yet. Pt is still running a low grade fever, 99.4 Would like a call back

## 2015-10-28 NOTE — Telephone Encounter (Signed)
Labs are back. Patient has been concerned about temperature around 99 but has not run a true fever. He is worried about dental infection as potential source- would encourage him to follow up with dentistry. If other symptoms develop or if increases to above 100.5 we need to see each other back

## 2015-10-29 NOTE — Telephone Encounter (Signed)
Called and left another message for pt tcb on cell #.

## 2015-10-29 NOTE — Telephone Encounter (Signed)
Returned pt call and notified him of below.

## 2015-11-25 ENCOUNTER — Ambulatory Visit: Payer: Medicare Other | Admitting: Family Medicine

## 2015-12-18 ENCOUNTER — Encounter: Payer: Self-pay | Admitting: Family Medicine

## 2015-12-18 ENCOUNTER — Ambulatory Visit (INDEPENDENT_AMBULATORY_CARE_PROVIDER_SITE_OTHER): Payer: Medicare Other | Admitting: Family Medicine

## 2015-12-18 VITALS — BP 122/86 | HR 83 | Temp 98.6°F | Wt 217.0 lb

## 2015-12-18 DIAGNOSIS — R509 Fever, unspecified: Secondary | ICD-10-CM

## 2015-12-18 DIAGNOSIS — D485 Neoplasm of uncertain behavior of skin: Secondary | ICD-10-CM

## 2015-12-18 DIAGNOSIS — I10 Essential (primary) hypertension: Secondary | ICD-10-CM

## 2015-12-18 DIAGNOSIS — M10079 Idiopathic gout, unspecified ankle and foot: Secondary | ICD-10-CM

## 2015-12-18 DIAGNOSIS — E785 Hyperlipidemia, unspecified: Secondary | ICD-10-CM

## 2015-12-18 LAB — CBC WITH DIFFERENTIAL/PLATELET
BASOS PCT: 0.7 % (ref 0.0–3.0)
Basophils Absolute: 0.1 10*3/uL (ref 0.0–0.1)
EOS PCT: 9.9 % — AB (ref 0.0–5.0)
Eosinophils Absolute: 0.9 10*3/uL — ABNORMAL HIGH (ref 0.0–0.7)
HCT: 44.6 % (ref 39.0–52.0)
Hemoglobin: 15.3 g/dL (ref 13.0–17.0)
LYMPHS ABS: 3.1 10*3/uL (ref 0.7–4.0)
Lymphocytes Relative: 32.7 % (ref 12.0–46.0)
MCHC: 34.2 g/dL (ref 30.0–36.0)
MCV: 88.6 fl (ref 78.0–100.0)
MONO ABS: 0.8 10*3/uL (ref 0.1–1.0)
MONOS PCT: 8.5 % (ref 3.0–12.0)
NEUTROS ABS: 4.6 10*3/uL (ref 1.4–7.7)
NEUTROS PCT: 48.2 % (ref 43.0–77.0)
PLATELETS: 213 10*3/uL (ref 150.0–400.0)
RBC: 5.03 Mil/uL (ref 4.22–5.81)
RDW: 13.1 % (ref 11.5–15.5)
WBC: 9.4 10*3/uL (ref 4.0–10.5)

## 2015-12-18 LAB — TSH: TSH: 1.05 u[IU]/mL (ref 0.35–4.50)

## 2015-12-18 MED ORDER — SILDENAFIL CITRATE 20 MG PO TABS: ORAL_TABLET | ORAL | Status: DC

## 2015-12-18 MED ORDER — CLONAZEPAM 1 MG PO TABS: 1.0000 mg | ORAL_TABLET | Freq: Every day | ORAL | Status: DC

## 2015-12-18 NOTE — Progress Notes (Signed)
[name], MD  Subjective:  Johnathan Sosa is a 70 y.o. year old very pleasant male patient who presents for/with See problem oriented charting ROS- No chest pain or shortness of breath. No headache or blurry vision.   Past Medical History-  Patient Active Problem List   Diagnosis Date Noted  . Closed TBI (traumatic brain injury) ([place]) 10/24/2014    Priority: High  . 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 Gout     Priority: Medium  . Insomnia     Priority: Medium  . Hyperlipidemia     Priority: Medium  . Depression     Priority: Medium  . HTN (hypertension) 04/29/2012    Priority: Medium  . Former smoker 10/01/2015    Priority: Low  . Erectile dysfunction 10/01/2015    Priority: Low  . Migraine headache     Priority: Low  . Abnormal eye movements 10/24/2014    Priority: Low  . 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 GERD (gastroesophageal reflux disease)     Priority: Low  . ADD (attention deficit disorder)     Priority: Low    Medications- reviewed and updated Current Outpatient Prescriptions  Medication Sig Dispense Refill  . allopurinol (ZYLOPRIM) 300 MG tablet Take 1 tablet (300 mg total) by mouth daily. 90 tablet 3  . amLODipine (NORVASC) 5 MG tablet Take 1 tablet (5 mg total) by mouth daily. 90 tablet 3  . atorvastatin (LIPITOR) 20 MG tablet Take 1 tablet (20 mg total) by mouth daily. 90 tablet 3  . buPROPion (WELLBUTRIN XL) 300 MG 24 hr tablet Take 1 tablet (300 mg total) by mouth daily. 90 tablet 3  . clonazePAM (KLONOPIN) 1 MG tablet Take 1 tablet (1 mg total) by mouth daily. 90 tablet 1  . Multiple Vitamin (MULTIVITAMIN) tablet Take 1 tablet by mouth daily.    . sildenafil (REVATIO) 20 MG tablet Take 2-5 tablets as needed once every 48 hours for erectile dysfunction 50 tablet 3  . sildenafil (VIAGRA) 100 MG tablet Take 1 tablet (100 mg total) by mouth daily as needed for erectile  dysfunction. (Patient not taking: Reported on 10/18/2015) 10 tablet 5   No current facility-administered medications for this visit.    Objective: BP 122/86 mmHg  Pulse 83  Temp(Src) 98.6 F (37 C)  Wt 217 lb (98.431 kg) Gen: NAD, resting comfortably CV: RRR no murmurs rubs or gallops Lungs: CTAB no crackles, wheeze, rhonchi Abdomen: soft/nontender/nondistended/normal bowel sounds. No rebound or guarding.  Ext: trace edema Skin: warm, dry. Raised lesion flesh colored on left cheek with slight scale (refer to derm) Neuro: grossly normal, moves all extremities  Assessment/Plan:  Hyperlipidemia S: well controlled on atorvastatin 20mg . No myalgias.  Lab Results  Component Value Date   CHOL 124 10/24/2015   HDL 38.50* 10/24/2015   LDLDIRECT 63.0 10/24/2015   TRIG 245.0* 10/24/2015   CHOLHDL 3 10/24/2015   A/P: continue current prescription  Hypertension S: controlled on amlodipine 5mg  BP Readings from Last 3 Encounters:  12/18/15 122/86  10/24/15 128/78  10/18/15 130/90  A/P:Continue current meds, great control  Gout S: uric acid controlled on last check on allpurinol 300mg , no gout attacks Lab Results  Component Value Date   LABURIC 5.3 10/24/2015  A/P: continue current rx  Neoplasm of uncertain behavior of skin - Plan: Ambulatory referral to Dermatology Spot on right cheek  Elevated temperature - Plan: CBC  with Differential/Platelet, TSH S: for months temperature has run mainly in 99s. Is normal in 98s today. Had a fractured tooth years ago when this happened. He is very concerned about it Ros- no night sweats, unintentional weight loss, abnormal fatigue A/P: will get CBC with diff and TSH. We discussed his level of concern - reasonable to pursue bloodwork and would follow up if new or worsening symptoms especially true fever. Alternative would be full body scan which do not think is a reasonable step. He is up to date on cancer screening- prostate, colon. Does have  heavy pack year smoking history so could consider CT lungs potentially especially if any shortness of breath, CXR could be starting point- discuss if persists at follow up   Return in about 6 months (around 06/18/2016). Return precautions advised.   Orders Placed This Encounter  Procedures  . CBC with Differential/Platelet  . TSH    [place]  . Ambulatory referral to Dermatology    Referral Priority:  Routine    Referral Type:  Consultation    Referral Reason:  Specialty Services Required    Requested Specialty:  Dermatology    Number of Visits Requested:  1    Meds ordered this encounter  Medications  . clonazePAM (KLONOPIN) 1 MG tablet    Sig: Take 1 tablet (1 mg total) by mouth daily.    Dispense:  90 tablet    Refill:  1  . sildenafil (REVATIO) 20 MG tablet    Sig: Take 2-5 tablets as needed once every 48 hours for erectile dysfunction    Dispense:  50 tablet    Refill:  3

## 2015-12-18 NOTE — Patient Instructions (Signed)
We will call you within a week about your referral to dermatology Dr. Allyson Sabal. If you do not hear within 2 weeks, give [place] a call.   Labs before you go- thyroid and white count check  Refilled clonazepam  No other changes

## 2016-01-06 DIAGNOSIS — L821 Other seborrheic keratosis: Secondary | ICD-10-CM | POA: Diagnosis not present

## 2016-01-06 DIAGNOSIS — L570 Actinic keratosis: Secondary | ICD-10-CM | POA: Diagnosis not present

## 2016-01-06 DIAGNOSIS — D1801 Hemangioma of skin and subcutaneous tissue: Secondary | ICD-10-CM | POA: Diagnosis not present

## 2016-01-06 DIAGNOSIS — D485 Neoplasm of uncertain behavior of skin: Secondary | ICD-10-CM | POA: Diagnosis not present

## 2016-02-10 DIAGNOSIS — S60419A Abrasion of unspecified finger, initial encounter: Secondary | ICD-10-CM | POA: Diagnosis not present

## 2016-03-30 ENCOUNTER — Encounter: Payer: Self-pay | Admitting: Family Medicine

## 2016-03-30 ENCOUNTER — Ambulatory Visit (INDEPENDENT_AMBULATORY_CARE_PROVIDER_SITE_OTHER): Payer: Medicare Other | Admitting: Family Medicine

## 2016-03-30 VITALS — BP 120/80 | Ht 73.0 in | Wt 208.4 lb

## 2016-03-30 DIAGNOSIS — R05 Cough: Secondary | ICD-10-CM

## 2016-03-30 DIAGNOSIS — R059 Cough, unspecified: Secondary | ICD-10-CM

## 2016-03-30 MED ORDER — HYDROCODONE-HOMATROPINE 5-1.5 MG/5ML PO SYRP: 5.0000 mL | ORAL_SOLUTION | Freq: Four times a day (QID) | ORAL | 0 refills | Status: AC | PRN

## 2016-03-30 NOTE — Patient Instructions (Signed)
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
## 2016-03-30 NOTE — Progress Notes (Signed)
Pre visit review using our clinic review tool, if applicable. No additional management support is needed unless otherwise documented below in the visit note. 

## 2016-04-08 ENCOUNTER — Other Ambulatory Visit: Payer: Self-pay | Admitting: Family Medicine

## 2016-04-08 MED ORDER — AMLODIPINE BESYLATE 5 MG PO TABS: 5.0000 mg | ORAL_TABLET | Freq: Every day | ORAL | 3 refills | Status: DC

## 2016-04-08 MED ORDER — ALLOPURINOL 300 MG PO TABS: 300.0000 mg | ORAL_TABLET | Freq: Every day | ORAL | 3 refills | Status: DC

## 2016-04-08 MED ORDER — ATORVASTATIN CALCIUM 20 MG PO TABS: 20.0000 mg | ORAL_TABLET | Freq: Every day | ORAL | 3 refills | Status: DC

## 2016-04-10 ENCOUNTER — Other Ambulatory Visit: Payer: Self-pay | Admitting: *Deleted

## 2016-04-10 MED ORDER — ATORVASTATIN CALCIUM 20 MG PO TABS: 20.0000 mg | ORAL_TABLET | Freq: Every day | ORAL | 3 refills | Status: DC

## 2016-04-10 MED ORDER — BUPROPION HCL ER (XL) 300 MG PO TB24: 300.0000 mg | ORAL_TABLET | Freq: Every day | ORAL | 0 refills | Status: DC

## 2016-04-10 MED ORDER — ALLOPURINOL 300 MG PO TABS: 300.0000 mg | ORAL_TABLET | Freq: Every day | ORAL | 3 refills | Status: DC

## 2016-04-10 MED ORDER — AMLODIPINE BESYLATE 5 MG PO TABS: 5.0000 mg | ORAL_TABLET | Freq: Every day | ORAL | 3 refills | Status: DC

## 2016-04-10 NOTE — Telephone Encounter (Signed)
Rx done. 

## 2016-04-13 NOTE — Telephone Encounter (Signed)
Yes thanks, can you change order to qhs prn for sleep though? Thanks

## 2016-04-14 MED ORDER — CLONAZEPAM 1 MG PO TABS: 1.0000 mg | ORAL_TABLET | Freq: Every day | ORAL | 1 refills | Status: DC

## 2016-06-26 ENCOUNTER — Ambulatory Visit (INDEPENDENT_AMBULATORY_CARE_PROVIDER_SITE_OTHER): Payer: Medicare Other

## 2016-06-26 DIAGNOSIS — Z23 Encounter for immunization: Secondary | ICD-10-CM | POA: Diagnosis not present

## 2016-07-06 ENCOUNTER — Encounter: Payer: Self-pay | Admitting: Neurology

## 2016-07-06 ENCOUNTER — Ambulatory Visit (INDEPENDENT_AMBULATORY_CARE_PROVIDER_SITE_OTHER): Payer: Medicare Other | Admitting: Neurology

## 2016-07-06 VITALS — BP 138/86 | HR 86 | Resp 20 | Ht 73.5 in | Wt 215.0 lb

## 2016-07-06 DIAGNOSIS — R4584 Anhedonia: Secondary | ICD-10-CM

## 2016-07-06 DIAGNOSIS — F988 Other specified behavioral and emotional disorders with onset usually occurring in childhood and adolescence: Secondary | ICD-10-CM | POA: Diagnosis not present

## 2016-07-06 DIAGNOSIS — F0789 Other personality and behavioral disorders due to known physiological condition: Secondary | ICD-10-CM | POA: Diagnosis not present

## 2016-07-06 DIAGNOSIS — F321 Major depressive disorder, single episode, moderate: Secondary | ICD-10-CM

## 2016-07-06 DIAGNOSIS — C711 Malignant neoplasm of frontal lobe: Secondary | ICD-10-CM | POA: Diagnosis not present

## 2016-07-06 DIAGNOSIS — F09 Unspecified mental disorder due to known physiological condition: Secondary | ICD-10-CM | POA: Diagnosis not present

## 2016-07-06 DIAGNOSIS — F909 Attention-deficit hyperactivity disorder, unspecified type: Secondary | ICD-10-CM | POA: Insufficient documentation

## 2016-07-06 MED ORDER — METHYLPHENIDATE HCL 5 MG PO TABS: 5.0000 mg | ORAL_TABLET | Freq: Two times a day (BID) | ORAL | 0 refills | Status: DC
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
## 2016-07-07 ENCOUNTER — Ambulatory Visit
Admission: RE | Admit: 2016-07-07 | Discharge: 2016-07-07 | Disposition: A | Discharge status: Home / Self Care | Payer: Medicare Other | Source: Ambulatory Visit | Attending: Neurology | Admitting: Neurology

## 2016-07-07 DIAGNOSIS — F988 Other specified behavioral and emotional disorders with onset usually occurring in childhood and adolescence: Secondary | ICD-10-CM

## 2016-07-07 DIAGNOSIS — F321 Major depressive disorder, single episode, moderate: Secondary | ICD-10-CM

## 2016-07-07 DIAGNOSIS — R4584 Anhedonia: Secondary | ICD-10-CM

## 2016-07-07 DIAGNOSIS — F09 Unspecified mental disorder due to known physiological condition: Principal | ICD-10-CM

## 2016-07-07 DIAGNOSIS — R413 Other amnesia: Secondary | ICD-10-CM | POA: Diagnosis not present

## 2016-07-07 DIAGNOSIS — F0789 Other personality and behavioral disorders due to known physiological condition: Secondary | ICD-10-CM

## 2016-07-08 ENCOUNTER — Telehealth: Payer: Self-pay | Admitting: Neurology

## 2016-07-08 ENCOUNTER — Other Ambulatory Visit: Payer: Self-pay | Admitting: Family Medicine

## 2016-07-08 NOTE — Telephone Encounter (Signed)
I will nee to have the professional reading on the images before we can discuss. CD

## 2016-07-08 NOTE — Telephone Encounter (Signed)
Pt's wife called request MRI results. I advised her it could be 2-4 days for the results. She understood and was appreciative

## 2016-07-08 NOTE — Telephone Encounter (Signed)
I spoke to pt's wife, Johnathan Sosa, per DPR. Pt's wife reports today that he is extremely depressed. He "only got going about 30 mins ago". He got upset yesterday after the MRI because it seemed like "they saw something" and said that his life expectancy now is only 68 years. Pt is refusing to take the ritalin because he has tried it before and coming off of a ritalin is like "a crash." I asked pt's wife if pt has seen a psychiatrist before. Pt's wife says that he has not, and would never see a psychiatrist. Pt's wife confirmed that he has taken his wellbutrin this morning.  As of right now, the MRI results are not available and I advised pt's wife of this and told it that it could take a few days.   Pt's wife is wondering what to do regarding pt's depression. Pt's wife is also wondering if we could expedite the MRI results to maybe help the pt feel better about what is going on?

## 2016-07-08 NOTE — Telephone Encounter (Signed)
Wife Johnathan Sosa called to advise, "husband is extremely depressed, I can't get him out of it, he won't take his Ritalin".

## 2016-07-08 NOTE — Telephone Encounter (Signed)
I have already spoke to pt's wife today and advised her of this. She verbalized understanding.

## 2016-07-09 NOTE — Telephone Encounter (Signed)
I spoke to both Bunges by phone, I explained that there had been no major change in his MRI appearance since 2016 but at tiny stroke of 5 mm was seen as new. I cannot truly rule out that this didn't slip between the images of last year's MRI is a 5 mm stroke is considered very very small. This was considered subacute. Johnathan Sosa is very concerned about his memory and yes there is some mesial temporal lobe atrophy but has not increased significantly over 18 month. I would like for him to enroll into one of our early Alzheimer's trials as I suspect that he could develop early dementia now. Will contact research department to look at his chart for inclusion/ exclusion criteria. CD

## 2016-07-09 NOTE — Telephone Encounter (Signed)
It appears that Dr. Leta Baptist has read the MRI for this pt today. Will you review this and let me know how you would like to proceed?

## 2016-07-10 MED ORDER — SERTRALINE HCL 25 MG PO TABS: 25.0000 mg | ORAL_TABLET | Freq: Every day | ORAL | 1 refills | Status: DC

## 2016-07-10 NOTE — Telephone Encounter (Signed)
Yes thanks 

## 2016-07-10 NOTE — Telephone Encounter (Signed)
Pt's wife called back this morning to speak with Dr D as was requested. She advised they can be reached at [phone]

## 2016-07-10 NOTE — Telephone Encounter (Signed)
I spoke to the couple this morning , indicating my interest in him participating in a research study. 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 I wrote a note to [place] to see if the patient can be a candidate and to inform me, I hope to get the answer by next Tuesday.  I also suspect that depression is a significant part of this presentation and suggest to sr tart ZOLOFT , patient agreed to start ZOLOFT. 25 mg   CD

## 2016-07-14 ENCOUNTER — Other Ambulatory Visit: Payer: Self-pay | Admitting: Family Medicine

## 2016-07-14 DIAGNOSIS — M25519 Pain in unspecified shoulder: Secondary | ICD-10-CM

## 2016-07-14 MED ORDER — BUPROPION HCL ER (XL) 300 MG PO TB24: 300.0000 mg | ORAL_TABLET | Freq: Every day | ORAL | 3 refills | Status: DC

## 2016-07-14 NOTE — Progress Notes (Signed)
Increasingly debilitating , refer Ortho 

## 2016-07-16 ENCOUNTER — Telehealth: Payer: Self-pay | Admitting: Neurology

## 2016-07-16 DIAGNOSIS — F0390 Unspecified dementia without behavioral disturbance: Secondary | ICD-10-CM

## 2016-07-16 DIAGNOSIS — R4189 Other symptoms and signs involving cognitive functions and awareness: Secondary | ICD-10-CM

## 2016-07-16 NOTE — Telephone Encounter (Signed)
Pt's wife has asked for you to call discuss your plans on the PET scan and how to proceed from here.

## 2016-07-16 NOTE — Telephone Encounter (Signed)
Metabolic MRI d is not an option on radiology menu.

## 2016-07-16 NOTE — Telephone Encounter (Signed)
Never mind, I  found it !. Please take the MRI out.

## 2016-07-16 NOTE — Telephone Encounter (Signed)
Amyloid PET scan is not covered by insurance. This can run anywhere from $2,000-$5,000.  Metabolic PET scans are covered. If insurance covers this, it will depend on the copay. You have to order this scan and Johnathan Sosa will do a PA and then we will know how much it will be.

## 2016-07-16 NOTE — Telephone Encounter (Signed)
I will inquire if PET is covered- usually it's not - and perhaps her son, Johnathan Sosa, can help.

## 2016-07-16 NOTE — Telephone Encounter (Signed)
Pt's wife called to advise she rec'd a call from [place] stating the pt did not qualify for the program. She wants to know what is the next step. She said she would like to proceed with the PET scan. Please call and advise.

## 2016-07-16 NOTE — Telephone Encounter (Signed)
Hinton Dyer, can you find out if a PET scan would be covered for this pt?

## 2016-07-16 NOTE — Telephone Encounter (Signed)
Johnathan Sosa, is that sepctroscopy ?

## 2016-07-17 ENCOUNTER — Telehealth: Payer: Self-pay

## 2016-07-17 NOTE — Telephone Encounter (Signed)
-----   Message from Marin Olp, MD sent at 07/17/2016  1:05 PM EST ----- [name]- can we get him set up for visit next week? If wife not ok with this- ok to work him in late today ----- Message ----- From: Larey Seat, MD Sent: 07/17/2016  10:48 AM To: Marin Olp, MD   Dear Dr. [name], Please call our mutual patient , Johnathan Sosa, who has developed palpitations and cognitive / personality changes. His wife is anxious. I have a brain PET ordered, but he has some non neurologic complaints he needs to address with you.   W yours, Larey Seat, MD @  [name]

## 2016-07-17 NOTE — Telephone Encounter (Signed)
Pt's wife called in asking to send order for PET to [facility]. Please call and advise [phone] Leaving Tuesday to go out of town.

## 2016-07-17 NOTE — Telephone Encounter (Signed)
I ordered metabolic PET scan of the brain, which is partially covered by insurance. Left VM for [name]. encouraged them to see if Johnathan Sosa, Mrs Bryner's son ,at  the [facility] has a  Better price for them .   She called my e right back and will CALL [facility] NOW TO SEE ABOUT PRICING , WILL CALL ME BACK TODAY.

## 2016-07-17 NOTE — Telephone Encounter (Signed)
Called patient and left a voicemail message asking for a return phone call. 

## 2016-07-17 NOTE — Telephone Encounter (Signed)
Message sent to me in error per [facility].

## 2016-07-17 NOTE — Telephone Encounter (Signed)
Please place order in [facility], per patient's wife's  information.

## 2016-07-17 NOTE — Telephone Encounter (Signed)
Wife called to discuss next step, ? PET scan, please call [phone].

## 2016-07-20 ENCOUNTER — Ambulatory Visit (INDEPENDENT_AMBULATORY_CARE_PROVIDER_SITE_OTHER): Payer: Medicare Other | Admitting: Family Medicine

## 2016-07-20 ENCOUNTER — Encounter: Payer: Self-pay | Admitting: Family Medicine

## 2016-07-20 VITALS — BP 126/76 | HR 101 | Temp 98.7°F | Ht 73.5 in | Wt 214.8 lb

## 2016-07-20 DIAGNOSIS — F331 Major depressive disorder, recurrent, moderate: Secondary | ICD-10-CM

## 2016-07-20 DIAGNOSIS — R413 Other amnesia: Secondary | ICD-10-CM | POA: Diagnosis not present

## 2016-07-20 DIAGNOSIS — I638 Other cerebral infarction: Secondary | ICD-10-CM

## 2016-07-20 DIAGNOSIS — Z114 Encounter for screening for human immunodeficiency virus [HIV]: Secondary | ICD-10-CM

## 2016-07-20 DIAGNOSIS — R002 Palpitations: Secondary | ICD-10-CM | POA: Diagnosis not present

## 2016-07-20 DIAGNOSIS — R000 Tachycardia, unspecified: Secondary | ICD-10-CM

## 2016-07-20 DIAGNOSIS — I639 Cerebral infarction, unspecified: Secondary | ICD-10-CM

## 2016-07-20 DIAGNOSIS — I491 Atrial premature depolarization: Secondary | ICD-10-CM

## 2016-07-20 DIAGNOSIS — Z8673 Personal history of transient ischemic attack (TIA), and cerebral infarction without residual deficits: Secondary | ICD-10-CM

## 2016-07-20 DIAGNOSIS — I6389 Other cerebral infarction: Secondary | ICD-10-CM

## 2016-07-20 NOTE — Progress Notes (Signed)
Pre visit review using our clinic review tool, if applicable. No additional management support is needed unless otherwise documented below in the visit note. 

## 2016-07-20 NOTE — Patient Instructions (Addendum)
Labs before you leave  EKG largely ok- 1 early beat starting from top chambers of herat  We will call you within a week about your referral to holter monitor for 48 hours. If you do not hear within 2 weeks, give [place] a call.

## 2016-07-20 NOTE — Progress Notes (Signed)
Subjective:  Johnathan Sosa is a 70 y.o. year old very pleasant male patient who presents for/with See problem oriented charting ROS- memory loss, depressed mood. Palpitations but no chest pain, shortness of breath, dizzinss. Does feel slightly off balance at times. .see any ROS included in HPI as well.   Past Medical History-  Patient Active Problem List   Diagnosis Date Noted  . History of CVA (cerebrovascular accident) 07/20/2016    Priority: High  . Closed TBI (traumatic brain injury) ([place]) 10/24/2014    Priority: High  . 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 Attention deficit hyperactivity disorder (ADHD) 07/06/2016    Priority: Medium  . Gout     Priority: Medium  . Insomnia     Priority: Medium  . Hyperlipidemia     Priority: Medium  . Depression     Priority: Medium  . HTN (hypertension) 04/29/2012    Priority: Medium  . Former smoker 10/01/2015    Priority: Low  . Erectile dysfunction 10/01/2015    Priority: Low  . Migraine headache     Priority: Low  . Abnormal eye movements 10/24/2014    Priority: Low  . 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 GERD (gastroesophageal reflux disease)     Priority: Low  . Memory loss     Priority: Low  . ADD (attention deficit disorder)     Priority: Low  . Palpitations 07/20/2016    Medications- reviewed and updated Current Outpatient Prescriptions  Medication Sig Dispense Refill  . allopurinol (ZYLOPRIM) 300 MG tablet Take 1 tablet (300 mg total) by mouth daily. 90 tablet 3  . amLODipine (NORVASC) 5 MG tablet Take 1 tablet (5 mg total) by mouth daily. 90 tablet 3  . atorvastatin (LIPITOR) 20 MG tablet Take 1 tablet (20 mg total) by mouth daily. 90 tablet 3  . buPROPion (WELLBUTRIN XL) 300 MG 24 hr tablet Take 1 tablet (300 mg total) by mouth daily. 90 tablet 3  . clonazePAM (KLONOPIN) 1 MG tablet Take 1 tablet (1 mg total) by mouth at bedtime. Take 1 tablet at  bedtime PRN for sleep 90 tablet 1  . Multiple Vitamin (MULTIVITAMIN) tablet Take 1 tablet by mouth daily.    . sertraline (ZOLOFT) 25 MG tablet Take 1 tablet (25 mg total) by mouth daily. For 14 days, after that time increase to 2  Tab daily by mouth 60 tablet 1   No current facility-administered medications for this visit.     Objective: BP 126/76 (BP Location: Left Arm, Patient Position: Sitting, Cuff Size: Large)   Pulse (!) 101   Temp 98.7 F (37.1 C) (Oral)   Ht 6' 1.5" (1.867 m)   Wt 214 lb 12.8 oz (97.4 kg)   SpO2 94%   BMI 27.96 kg/m  Gen: NAD, resting comfortably No thyromegaly CV: RRR (on repeat exam) no murmurs rubs or gallops Lungs: CTAB no crackles, wheeze, rhonchi Abdomen: soft/nontender/nondistended/normal bowel sounds. No rebound or guarding.  Ext: no edema  EKG: Rate 78, sinus rhythm, normal axis, normal intervals, no hypertrophy, no st changes, flattening in III and V1.   Assessment/Plan:  CVA (cerebrovascular accident) hx S: since last visit [facility]- Subacute ischemic infarct in right frontal/coprpus callosal region. Patient compliant with BP control, aspirin, lipid control. Has seen neurology who did not plan on further workup or changes to aspirin to plavix.  A/P: We will get echo, carotid  dopplers. Palpitations- will get 48 hour holter to evaluate for arhythmia specifically atrial fibrillation   Palpitations S:Palpitations at night time- heart racing. Just one week but happening at latest every other night. Tachycardia mild on initial exam today A/P: check tsh, cbc. Also get holter monitor. EKG with PACs. Get echocardiogram. May need stress testing depending on findings.    Depression S: seems down. Not going to gym, seems much more down. Started on zoloft 25 with plan to titration to 50mg  alongside wellbutrin through neurology A/P: Has started to see mild improvement. Continue to monitor- seems to be on appropriate therapy. Has visit with me in  about a month- can check phq9   Memory loss S: workout through neurology and dementia concern.  A/P: we discussed getting labs to rule out reversible causes including b12, tsh, rpr, tsh, hiv, cbc, cmp though suspect likely low yield.   Has visit next month  Orders Placed This Encounter  Procedures  . Vitamin B12  . TSH    [place]  . CBC    [facility]  . Comprehensive metabolic panel    [place]  . RPR    [facility]  . HIV antibody    [facility]  . Holter monitor - 48 hour    Standing Status:   Future    Standing Expiration Date:   07/20/2026  . EKG 12-Lead    Order Specific Question:   Where should this test be performed    Answer:   Other  . ECHOCARDIOGRAM COMPLETE    Standing Status:   Future    Standing Expiration Date:   10/20/2017    Order Specific Question:   Where should this test be performed    Answer:   [facility])    Order Specific Question:   Does the patient weigh less than or greater than 250 lbs?    Answer:   Patient weighs less than 250 lbs    Order Specific Question:   Complete or Limited study?    Answer:   Complete    Order Specific Question:   With Image Enhancing Agent or without Image Enhancing Agent?    Answer:   With Image Enhancing Agent    Order Specific Question:   Reason for exam-Echo    Answer:   Stroke  434.91 / I163.9   The duration of face-to-face time during this visit was greater than 42 minutes (5:15-passed 5:57. Greater than 50% of this time was spent in counseling about prior stroke, memory loss, treatment regimens, workup for stroke, depression, dealing with recent stressors. Wife asks about being started on medicine for depression/stress after visit for patient- encouraged her to schedule follow up  Return precautions advised.  [name], MD

## 2016-07-20 NOTE — Telephone Encounter (Signed)
Patient has been scheduled for his PET Scan 07/29/2016 arrive at [facility] at 1:30 for 2:00 apt. NPO six hours before. PET Scan - [phone].   Patient's wife relayed that she could not get her husbands PCP on the telephone. I relayed to her that I would try.  Called Dr. Annie Main Hunter's office at [phone] and held the line . For 20 minutes.  Sent Dr. Ronney Lion office and fax and relayed patient wife needs to be called because patient needed to have a physical done.   Patient wife had calmed down now and was very grateful. Patient's wife understood all directions.

## 2016-07-20 NOTE — Assessment & Plan Note (Signed)
S: seems down. Not going to gym, seems much more down. Started on zoloft 25 with plan to titration to 50mg  alongside wellbutrin through neurology A/P: Has started to see mild improvement. Continue to monitor- seems to be on appropriate therapy. Has visit with me in about a month- can check phq9

## 2016-07-20 NOTE — Assessment & Plan Note (Signed)
S:Palpitations at night time- heart racing. Just one week but happening at latest every other night. Tachycardia mild on initial exam today A/P: check tsh, cbc. Also get holter monitor. EKG with PACs. Get echocardiogram. May need stress testing depending on findings.

## 2016-07-20 NOTE — Assessment & Plan Note (Signed)
S: since last visit [facility]- Subacute ischemic infarct in right frontal/coprpus callosal region. Patient compliant with BP control, aspirin, lipid control. Has seen neurology who did not plan on further workup or changes to aspirin to plavix.  A/P: We will get echo, carotid dopplers. Palpitations- will get 48 hour holter to evaluate for arhythmia specifically atrial fibrillation

## 2016-07-20 NOTE — Assessment & Plan Note (Signed)
S: workout through neurology and dementia concern.  A/P: we discussed getting labs to rule out reversible causes including b12, tsh, rpr, tsh, hiv, cbc, cmp though suspect likely low yield.

## 2016-07-20 NOTE — Telephone Encounter (Signed)
Johnathan Sosa, can you send the metabolic PET scan order to [facility] please?

## 2016-07-21 LAB — POC URINALSYSI DIPSTICK (AUTOMATED)
BILIRUBIN UA: NEGATIVE
Blood, UA: NEGATIVE
GLUCOSE UA: NEGATIVE
KETONES UA: NEGATIVE
LEUKOCYTES UA: NEGATIVE
Nitrite, UA: NEGATIVE
PROTEIN UA: NEGATIVE
SPEC GRAV UA: 1.01
Urobilinogen, UA: 0.2
pH, UA: 6

## 2016-07-21 NOTE — Addendum Note (Signed)
Addended by: Gari Crown D on: 07/21/2016 09:00 AM   Modules accepted: Orders

## 2016-07-24 ENCOUNTER — Ambulatory Visit (HOSPITAL_COMMUNITY): Payer: Medicare Other

## 2016-07-27 ENCOUNTER — Other Ambulatory Visit: Payer: Medicare Other

## 2016-07-27 DIAGNOSIS — Z114 Encounter for screening for human immunodeficiency virus [HIV]: Secondary | ICD-10-CM | POA: Diagnosis not present

## 2016-07-27 DIAGNOSIS — R413 Other amnesia: Secondary | ICD-10-CM | POA: Diagnosis not present

## 2016-07-27 LAB — COMPREHENSIVE METABOLIC PANEL
ALK PHOS: 54 U/L (ref 39–117)
ALT: 37 U/L (ref 0–53)
AST: 20 U/L (ref 0–37)
Albumin: 4.1 g/dL (ref 3.5–5.2)
BILIRUBIN TOTAL: 0.7 mg/dL (ref 0.2–1.2)
BUN: 18 mg/dL (ref 6–23)
CALCIUM: 9.2 mg/dL (ref 8.4–10.5)
CO2: 27 mEq/L (ref 19–32)
Chloride: 107 mEq/L (ref 96–112)
Creatinine, Ser: 1.13 mg/dL (ref 0.40–1.50)
GFR: 68.54 mL/min (ref >60.00)
GLUCOSE: 97 mg/dL (ref 70–99)
POTASSIUM: 4.1 meq/L (ref 3.5–5.1)
Sodium: 141 mEq/L (ref 135–145)
TOTAL PROTEIN: 6.1 g/dL (ref 6.0–8.3)

## 2016-07-27 LAB — CBC
HEMATOCRIT: 44.2 % (ref 39.0–52.0)
HEMOGLOBIN: 15.2 g/dL (ref 13.0–17.0)
MCHC: 34.3 g/dL (ref 30.0–36.0)
MCV: 87.7 fl (ref 78.0–100.0)
PLATELETS: 230 10*3/uL (ref 150.0–400.0)
RBC: 5.05 Mil/uL (ref 4.22–5.81)
RDW: 12.9 % (ref 11.5–15.5)
WBC: 8.9 10*3/uL (ref 4.0–10.5)

## 2016-07-27 LAB — TSH: TSH: 1.19 u[IU]/mL (ref 0.35–4.50)

## 2016-07-27 LAB — VITAMIN B12: Vitamin B-12: 610 pg/mL (ref 211–911)

## 2016-07-28 LAB — HIV ANTIBODY (ROUTINE TESTING W REFLEX): HIV 1&2 Ab, 4th Generation: NONREACTIVE

## 2016-07-28 LAB — RPR

## 2016-07-29 ENCOUNTER — Encounter (HOSPITAL_COMMUNITY)
Admission: RE | Admit: 2016-07-29 | Discharge: 2016-07-29 | Disposition: A | Discharge status: Home / Self Care | Payer: Medicare Other | Source: Ambulatory Visit | Attending: Neurology | Admitting: Neurology

## 2016-07-29 DIAGNOSIS — F0390 Unspecified dementia without behavioral disturbance: Secondary | ICD-10-CM | POA: Insufficient documentation

## 2016-07-29 DIAGNOSIS — R4189 Other symptoms and signs involving cognitive functions and awareness: Secondary | ICD-10-CM | POA: Diagnosis not present

## 2016-07-29 DIAGNOSIS — G3184 Mild cognitive impairment, so stated: Secondary | ICD-10-CM | POA: Diagnosis not present

## 2016-07-29 MED ORDER — FLUDEOXYGLUCOSE F - 18 (FDG) INJECTION
9.1400 | Freq: Once | INTRAVENOUS | Status: AC | PRN
  Administered 2016-07-29: 9.14 via INTRAVENOUS

## 2016-07-29 NOTE — Telephone Encounter (Signed)
It could take 7 days to receive PET scan results for this pt. Dr. Brett Fairy will look out for the results and will call as soon as we receive them.  No answer, left a message asking pt or pt's wife to call me back.

## 2016-07-29 NOTE — Telephone Encounter (Signed)
I spoke to pt's wife. I advised her that the results could take 7-10 days for [place] to receive, but hopefully sooner. Pt's wife is tearful. Pt's wife asked that if they "saw anything wrong" during the scan, would they call Dr. Brett Fairy right away? I explained that if they saw anything emergent, which is life threatening, then the radiology team would call Dr. Brett Fairy immediately. If the results show disease progression that is not IMMEDIATELY life threatening, we will call them when these results are received. Pt's wife is asking that we call her cell phone number to discuss results ([phone]). Pt's wife verbalized understanding.

## 2016-07-29 NOTE — Telephone Encounter (Signed)
Pt's wife called to advise they are anxious about getting the results for PET scan. She wants to know how long will take to get the results from Dr Brett Fairy.

## 2016-07-30 ENCOUNTER — Telehealth: Payer: Self-pay | Admitting: Neurology

## 2016-07-30 DIAGNOSIS — F322 Major depressive disorder, single episode, severe without psychotic features: Secondary | ICD-10-CM

## 2016-07-30 MED ORDER — SERTRALINE HCL 50 MG PO TABS: 50.0000 mg | ORAL_TABLET | Freq: Every day | ORAL | 3 refills | Status: DC

## 2016-07-30 NOTE — Telephone Encounter (Signed)
Pt's wife called again wanting Dr. Brett Fairy to call them to discuss the PET scan results because they are "beside themselves." I advised pt's wife that I will have Dr. Brett Fairy call them as soon as she can.

## 2016-07-30 NOTE — Telephone Encounter (Signed)
I am off today, Took a PAL day. Will answer this last phone call .

## 2016-07-30 NOTE — Telephone Encounter (Signed)
I have called and given the patient this information- NO EVIDENCE of Alzheimers or frontal dementia. We need to concentrate on depression treatment. CD  FINDINGS: CT examination: Moderate cortical atrophy noted over the high frontal parietal lobes. No acute findings on CT exam.  PET imaging: No relative decreased cortical metabolism within the the parietal lobes when compared to the frontal lobes and occipital lobes. Normal cortical metabolism in the occipital lobes. No relative decreased metabolism in the temporal lobes.  IMPRESSION: 1. No relative decreased cortical metabolism to suggest Alzheimer's type pathology or frontotemporal dementia. 2. Mild-to-moderate cortical atrophy.   Electronically Signed   By: Suzy Bouchard M.D.   On: 07/29/2016 16:10  I will increase the zoloft to 50 mg and make a referral to psychiatrist.

## 2016-08-03 ENCOUNTER — Telehealth: Payer: Self-pay | Admitting: Neurology

## 2016-08-03 NOTE — Telephone Encounter (Signed)
I suspect that depression plays a major rule, s at least he did not have typical Alzheimers changes on PET scan.  I cannot make him go to a psychiatrist but wonder why he is so reluctant.

## 2016-08-03 NOTE — Telephone Encounter (Signed)
Patient's wife wants a call back from Dr. Lenice Pressman. Patient presented to the lobby due to the phones not being answered. Best call back is [phone]
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
## 2016-08-05 ENCOUNTER — Telehealth: Payer: Self-pay

## 2016-08-05 NOTE — Telephone Encounter (Signed)
-----   Message from Larey Seat, MD sent at 08/04/2016  4:36 PM EST ----- Discussed with patient and spouse.  Not a finding comparable with Alzheimer's disease. Not lewy body typical. Memory loss can be related to TBI and to major depression. I like for the patient to see psychiatry. CD

## 2016-08-05 NOTE — Telephone Encounter (Signed)
I called wife back per Dr. Brett Fairy to advise that we sent referral to [facility] and I gave her their contact number. She will call tomorrow to make appt.

## 2016-08-05 NOTE — Telephone Encounter (Signed)
Wife understands that we would like assistance by psychiatry to evaluate for personality changes, depression, impulse control. She will continue ot convince him. 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

## 2016-08-05 NOTE — Telephone Encounter (Signed)
Patient's wife is calling back still waiting for a returned call.

## 2016-08-07 DIAGNOSIS — M19011 Primary osteoarthritis, right shoulder: Secondary | ICD-10-CM | POA: Diagnosis not present

## 2016-08-12 ENCOUNTER — Ambulatory Visit (INDEPENDENT_AMBULATORY_CARE_PROVIDER_SITE_OTHER): Payer: Medicare Other | Admitting: Family Medicine

## 2016-08-12 ENCOUNTER — Ambulatory Visit: Payer: Medicare Other

## 2016-08-12 ENCOUNTER — Encounter: Payer: Self-pay | Admitting: Family Medicine

## 2016-08-12 VITALS — BP 122/86 | HR 97 | Temp 98.6°F | Ht 73.5 in | Wt 211.8 lb

## 2016-08-12 DIAGNOSIS — R413 Other amnesia: Secondary | ICD-10-CM

## 2016-08-12 DIAGNOSIS — R002 Palpitations: Secondary | ICD-10-CM | POA: Diagnosis not present

## 2016-08-12 DIAGNOSIS — I10 Essential (primary) hypertension: Secondary | ICD-10-CM

## 2016-08-12 DIAGNOSIS — F331 Major depressive disorder, recurrent, moderate: Secondary | ICD-10-CM

## 2016-08-12 DIAGNOSIS — I638 Other cerebral infarction: Secondary | ICD-10-CM

## 2016-08-12 DIAGNOSIS — E785 Hyperlipidemia, unspecified: Secondary | ICD-10-CM | POA: Diagnosis not present

## 2016-08-12 DIAGNOSIS — Z0001 Encounter for general adult medical examination with abnormal findings: Secondary | ICD-10-CM | POA: Diagnosis not present

## 2016-08-12 LAB — LIPID PANEL
CHOL/HDL RATIO: 4
Cholesterol: 155 mg/dL (ref 0–200)
HDL: 39.1 mg/dL (ref >39.00)
LDL Cholesterol: 86 mg/dL (ref 0–99)
NONHDL: 115.58
TRIGLYCERIDES: 148 mg/dL (ref 0.0–149.0)
VLDL: 29.6 mg/dL (ref 0.0–40.0)

## 2016-08-12 MED ORDER — ESCITALOPRAM OXALATE 10 MG PO TABS: 10.0000 mg | ORAL_TABLET | Freq: Every day | ORAL | 3 refills | Status: DC

## 2016-08-12 NOTE — Assessment & Plan Note (Addendum)
S: poor control of anxiety and depression on wellbutrin 300mg  XL alone. [place] a fog on 50mg  of zoloft even when slowly titrated up and hesitant to retry. Had bad expeience with counseling in the past and very resistant to trying this. Had referral to psychiatry through neurology but $150 upfront payment has caused them to not want to pursue this. Also gad 7 of 14 A/P: extended ounseling about anxiety/depression today. Wife and patient are very frustrated with him not being himself, navigatiging healthcare, som edifficult family dynamics, discussing prior poor experiences with counseling. We ultimately agreed to continue wellbutrin 300mg  XL and trial lexapro 10mg  with follow up early January. Thoughts thathe would be better off dead are present but no thoughts of hurting himself- agrees to call 911 or [place] immediately if this happens.   GAD7 of 15, GAD7 of 14
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
## 2016-08-12 NOTE — Patient Instructions (Addendum)
Cholesterol check in lab  Start lexapro 10mg - I would be happy to see you early January to check in but takes about 6 weeks for medication to have full effect. Suspect we may need to go up to 20mg  dose  Also consider calling our behavioral health counselors- you declined for now  Faxing in rx for Johnathan Sosa   After labs- sit in lobby and Johnathan Sosa will call you back   Johnathan Sosa , Thank you for taking time to come for your Medicare Wellness Visit. I appreciate your ongoing commitment to your health goals. Please review the following plan we discussed and let me know if I can assist you in the future.   Will have eye exam next year   Check with the facility that gave you your tetanus to see if they have a record;  Taken 2016 Recommend taking tetanus with pertusses; (TDAP)   These are the goals we discussed: Goals    . Exercise 150 minutes per week (moderate activity)          Lifting weight at Y 3 times a week        This is a list of the screening recommended for you and due dates:  Health Maintenance  Topic Date Due  . Tetanus Vaccine  05/04/1967  . Pneumonia vaccines (2 of 2 - PPSV23) 09/30/2016  . Colon Cancer Screening  03/15/2017  . Flu Shot  Completed  . Shingles Vaccine  Completed  .  Hepatitis C: One time screening is recommended by Center for Disease Control  (CDC) for  adults born from [birth year] through 1965.   Completed   Health Maintenance, Male A healthy lifestyle and preventative care can promote health and wellness.  Maintain regular health, dental, and eye exams.  Eat a healthy diet. Foods like vegetables, fruits, whole grains, low-fat dairy products, and lean protein foods contain the nutrients you need and are low in calories. Decrease your intake of foods high in solid fats, added sugars, and salt. Get information about a proper diet from your health care provider, if necessary.  Regular physical exercise is one of the most important things you can do for your health.  Most adults should get at least 150 minutes of moderate-intensity exercise (any activity that increases your heart rate and causes you to sweat) each week. In addition, most adults need muscle-strengthening exercises on 2 or more days a week.   Maintain a healthy weight. The body mass index (BMI) is a screening tool to identify possible weight problems. It provides an estimate of body fat based on height and weight. Your health care provider can find your BMI and can help you achieve or maintain a healthy weight. For males 20 years and older:  A BMI below 18.5 is considered underweight.  A BMI of 18.5 to 24.9 is normal.  A BMI of 25 to 29.9 is considered overweight.  A BMI of 30 and above is considered obese.  Maintain normal blood lipids and cholesterol by exercising and minimizing your intake of saturated fat. Eat a balanced diet with plenty of fruits and vegetables. Blood tests for lipids and cholesterol should begin at age 34 and be repeated every 5 years. If your lipid or cholesterol levels are high, you are over age 60, or you are at high risk for heart disease, you may need your cholesterol levels checked more frequently.Ongoing high lipid and cholesterol levels should be treated with medicines if diet and exercise are not working.  If  you smoke, find out from your health care provider how to quit. If you do not use tobacco, do not start.  Lung cancer screening is recommended for adults aged 50-80 years who are at high risk for developing lung cancer because of a history of smoking. A yearly low-dose CT scan of the lungs is recommended for people who have at least a 30-pack-year history of smoking and are current smokers or have quit within the past 15 years. A pack year of smoking is smoking an average of 1 pack of cigarettes a day for 1 year (for example, a 30-pack-year history of smoking could mean smoking 1 pack a day for 30 years or 2 packs a day for 15 years). Yearly screening should  continue until the smoker has stopped smoking for at least 15 years. Yearly screening should be stopped for people who develop a health problem that would prevent them from having lung cancer treatment.  If you choose to drink alcohol, do not have more than 2 drinks per day. One drink is considered to be 12 oz (360 mL) of beer, 5 oz (150 mL) of wine, or 1.5 oz (45 mL) of liquor.  Avoid the use of street drugs. Do not share needles with anyone. Ask for help if you need support or instructions about stopping the use of drugs.  High blood pressure causes heart disease and increases the risk of stroke. High blood pressure is more likely to develop in:  People who have blood pressure in the end of the normal range (100-139/85-89 mm Hg).  People who are overweight or obese.  People who are African American.  If you are 34-70 years of age, have your blood pressure checked every 3-5 years. If you are 77 years of age or older, have your blood pressure checked every year. You should have your blood pressure measured twice-once when you are at a hospital or clinic, and once when you are not at a hospital or clinic. Record the average of the two measurements. To check your blood pressure when you are not at a hospital or clinic, you can use:  An automated blood pressure machine at a pharmacy.  A home blood pressure monitor.  If you are 22-33 years old, ask your health care provider if you should take aspirin to prevent heart disease.  Diabetes screening involves taking a blood sample to check your fasting blood sugar level. This should be done once every 3 years after age 55 if you are at a normal weight and without risk factors for diabetes. Testing should be considered at a younger age or be carried out more frequently if you are overweight and have at least 1 risk factor for diabetes.  Colorectal cancer can be detected and often prevented. Most routine colorectal cancer screening begins at the age of  45 and continues through age 66. However, your health care provider may recommend screening at an earlier age if you have risk factors for colon cancer. On a yearly basis, your health care provider may provide home test kits to check for hidden blood in the stool. A small camera at the end of a tube may be used to directly examine the colon (sigmoidoscopy or colonoscopy) to detect the earliest forms of colorectal cancer. Talk to your health care provider about this at age 60 when routine screening begins. A direct exam of the colon should be repeated every 5-10 years through age 9, unless early forms of precancerous polyps or small growths  are found.  People who are at an increased risk for hepatitis B should be screened for this virus. You are considered at high risk for hepatitis B if:  You were born in a country where hepatitis B occurs often. Talk with your health care provider about which countries are considered high risk.  Your parents were born in a high-risk country and you have not received a shot to protect against hepatitis B (hepatitis B vaccine).  You have HIV or AIDS.  You use needles to inject street drugs.  You live with, or have sex with, someone who has hepatitis B.  You are a man who has sex with other men (MSM).  You get hemodialysis treatment.  You take certain medicines for conditions like cancer, organ transplantation, and autoimmune conditions.  Hepatitis C blood testing is recommended for all people born from [birth year] through 1965 and any individual with known risk factors for hepatitis C.  Healthy men should no longer receive prostate-specific antigen (PSA) blood tests as part of routine cancer screening. Talk to your health care provider about prostate cancer screening.  Testicular cancer screening is not recommended for adolescents or adult males who have no symptoms. Screening includes self-exam, a health care provider exam, and other screening tests. Consult with  your health care provider about any symptoms you have or any concerns you have about testicular cancer.  Practice safe sex. Use condoms and avoid high-risk sexual practices to reduce the spread of sexually transmitted infections (STIs).  You should be screened for STIs, including gonorrhea and chlamydia if:  You are sexually active and are younger than 24 years.  You are older than 24 years, and your health care provider tells you that you are at risk for this type of infection.  Your sexual activity has changed since you were last screened, and you are at an increased risk for chlamydia or gonorrhea. Ask your health care provider if you are at risk.  If you are at risk of being infected with HIV, it is recommended that you take a prescription medicine daily to prevent HIV infection. This is called pre-exposure prophylaxis (PrEP). You are considered at risk if:  You are a man who has sex with other men (MSM).  You are a heterosexual man who is sexually active with multiple partners.  You take drugs by injection.  You are sexually active with a partner who has HIV.  Talk with your health care provider about whether you are at high risk of being infected with HIV. If you choose to begin PrEP, you should first be tested for HIV. You should then be tested every 3 months for as long as you are taking PrEP.  Use sunscreen. Apply sunscreen liberally and repeatedly throughout the day. You should seek shade when your shadow is shorter than you. Protect yourself by wearing long sleeves, pants, a wide-brimmed hat, and sunglasses year round whenever you are outdoors.  Tell your health care provider of new moles or changes in moles, especially if there is a change in shape or color. Also, tell your health care provider if a mole is larger than the size of a pencil eraser.  A one-time screening for abdominal aortic aneurysm (AAA) and surgical repair of large AAAs by ultrasound is recommended for men  aged 53-75 years who are current or former smokers.  Stay current with your vaccines (immunizations). This information is not intended to replace advice given to you by your health care provider. Make sure  you discuss any questions you have with your health care provider. Document Released: 02/13/2008 Document Revised: 09/07/2014 Document Reviewed: 05/21/2015 Elsevier Interactive Patient Education  2017 [employer].

## 2016-08-12 NOTE — Assessment & Plan Note (Signed)
S: controlled per JNC8 BP Readings from Last 3 Encounters:  08/12/16 122/86  07/20/16 126/76  07/06/16 138/86  A/P:Continue current meds:  Discussed would continue amlodipine 5mg  at present though would prefer diastolic under 80- hopeful with improvement of stress and return to exercise will improve

## 2016-08-12 NOTE — Progress Notes (Signed)
Subjective:  Johnathan Sosa is a 70 y.o. year old very pleasant male patient who presents for/with See problem oriented charting ROS- No chest pain or shortness of breath. No headache or blurry vision. Difficulties with memory and depressed mood noted.    Past Medical History-  Patient Active Problem List   Diagnosis Date Noted  . History of CVA (cerebrovascular accident) 07/20/2016    Priority: High  . Closed TBI (traumatic brain injury) ([place]) 10/24/2014    Priority: High  . 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 Attention deficit hyperactivity disorder (ADHD) 07/06/2016    Priority: Medium  . Gout     Priority: Medium  . Insomnia     Priority: Medium  . Hyperlipidemia     Priority: Medium  . Depression     Priority: Medium  . HTN (hypertension) 04/29/2012    Priority: Medium  . Former smoker 10/01/2015    Priority: Low  . Erectile dysfunction 10/01/2015    Priority: Low  . Migraine headache     Priority: Low  . Abnormal eye movements 10/24/2014    Priority: Low  . 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 GERD (gastroesophageal reflux disease)     Priority: Low  . Memory loss     Priority: Low  . ADD (attention deficit disorder)     Priority: Low  . Palpitations 07/20/2016    Medications- reviewed and updated Current Outpatient Prescriptions  Medication Sig Dispense Refill  . allopurinol (ZYLOPRIM) 300 MG tablet Take 1 tablet (300 mg total) by mouth daily. 90 tablet 3  . amLODipine (NORVASC) 5 MG tablet Take 1 tablet (5 mg total) by mouth daily. 90 tablet 3  . atorvastatin (LIPITOR) 20 MG tablet Take 1 tablet (20 mg total) by mouth daily. 90 tablet 3  . buPROPion (WELLBUTRIN XL) 300 MG 24 hr tablet Take 1 tablet (300 mg total) by mouth daily. 90 tablet 3  . clonazePAM (KLONOPIN) 1 MG tablet Take 1 tablet (1 mg total) by mouth at bedtime. Take 1 tablet at bedtime PRN for sleep 90 tablet 1  . Multiple  Vitamin (MULTIVITAMIN) tablet Take 1 tablet by mouth daily.    [name] Kitchen escitalopram (LEXAPRO) 10 MG tablet Take 1 tablet (10 mg total) by mouth daily. 90 tablet 3   No current facility-administered medications for this visit.     Objective: BP 122/86   Pulse 97   Temp 98.6 F (37 C) (Oral)   Ht 6' 1.5" (1.867 m)   Wt 211 lb 12.8 oz (96.1 kg)   SpO2 95%   BMI 27.56 kg/m  Gen: NAD, resting comfortably, athletic for age CV: RRR no murmurs rubs or gallops Lungs: CTAB no crackles, wheeze, rhonchi Abdomen: soft/nontender/nondistended/normal bowel sounds. No rebound or guarding.  Ext: no edema Skin: warm, dry  Assessment/Plan:  Depression S: poor control of anxiety and depression on wellbutrin 300mg  XL alone. [place] a fog on 50mg  of zoloft even when slowly titrated up and hesitant to retry. Had bad expeience with counseling in the past and very resistant to trying this. Had referral to psychiatry through neurology but $150 upfront payment has caused them to not want to pursue this. Also gad 7 of 14 A/P: extended ounseling about anxiety/depression today. Wife and patient are very frustrated with him not being himself, navigatiging healthcare, som edifficult family dynamics, discussing prior poor experiences with counseling. We ultimately agreed to continue wellbutrin  300mg  XL and trial lexapro 10mg  with follow up early January. Thoughts thathe would be better off dead are present but no thoughts of hurting himself- agrees to call 911 or [place] immediately if this happens.   GAD7 of 15, GAD7 of 14   Hyperlipidemia S: poorly controlled on atorvastatin 20mg  considering prior CVA- had been controlled in February but much less active. No myalgias.  Lab Results  Component Value Date   CHOL 155 08/12/2016   HDL 39.10 08/12/2016   LDLCALC 86 08/12/2016   LDLDIRECT 63.0 10/24/2015   TRIG 148.0 08/12/2016   CHOLHDL 4 08/12/2016   A/P: will reach out to patient with question of whether he wants to  increase statin dose or refocus on diet/exercise   Palpitations S: no palpitations since last visit. holter monitor has not been set up yet- discussed with referral coordinator. Echocardiogram and carotid dopplers set up for 08/17/16 already (with cva history) A/P: glad no further palpitations- but will complete workup as above (also covers cva workup)  HTN (hypertension) S: controlled per JNC8 BP Readings from Last 3 Encounters:  08/12/16 122/86  07/20/16 126/76  07/06/16 138/86  A/P:Continue current meds:  Discussed would continue amlodipine 5mg  at present though would prefer diastolic under 80- hopeful with improvement of stress and return to exercise will improve  early january  Orders Placed This Encounter  Procedures  . Lipid panel    [place]    Order Specific Question:   Has the patient fasted?    Answer:   No    Meds ordered this encounter  Medications  . escitalopram (LEXAPRO) 10 MG tablet    Sig: Take 1 tablet (10 mg total) by mouth daily.    Dispense:  90 tablet    Refill:  3   The duration of face-to-face time during this visit was greater than 30 minutes. Greater than 50% of this time was spent in counselingas noted above in depression section.   Return precautions advised.  [name], MD

## 2016-08-12 NOTE — Progress Notes (Signed)
Pre visit review using our clinic review tool, if applicable. No additional management support is needed unless otherwise documented below in the visit note. 

## 2016-08-12 NOTE — Assessment & Plan Note (Signed)
S: poorly controlled on atorvastatin 20mg  considering prior CVA- had been controlled in February but much less active. No myalgias.  Lab Results  Component Value Date   CHOL 155 08/12/2016   HDL 39.10 08/12/2016   LDLCALC 86 08/12/2016   LDLDIRECT 63.0 10/24/2015   TRIG 148.0 08/12/2016   CHOLHDL 4 08/12/2016   A/P: will reach out to patient with question of whether he wants to increase statin dose or refocus on diet/exercise
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
## 2016-08-12 NOTE — Assessment & Plan Note (Signed)
S: no palpitations since last visit. holter monitor has not been set up yet- discussed with referral coordinator. Echocardiogram and carotid dopplers set up for 08/17/16 already (with cva history) A/P: glad no further palpitations- but will complete workup as above (also covers cva workup)

## 2016-08-17 ENCOUNTER — Ambulatory Visit (HOSPITAL_BASED_OUTPATIENT_CLINIC_OR_DEPARTMENT_OTHER): Payer: Medicare Other

## 2016-08-17 ENCOUNTER — Other Ambulatory Visit: Payer: Self-pay

## 2016-08-17 ENCOUNTER — Ambulatory Visit (HOSPITAL_COMMUNITY)
Admission: RE | Admit: 2016-08-17 | Discharge: 2016-08-17 | Disposition: A | Discharge status: Home / Self Care | Payer: Medicare Other | Source: Ambulatory Visit | Attending: Family Medicine | Admitting: Family Medicine

## 2016-08-17 DIAGNOSIS — I340 Nonrheumatic mitral (valve) insufficiency: Secondary | ICD-10-CM | POA: Insufficient documentation

## 2016-08-17 DIAGNOSIS — Z8673 Personal history of transient ischemic attack (TIA), and cerebral infarction without residual deficits: Secondary | ICD-10-CM | POA: Insufficient documentation

## 2016-08-17 DIAGNOSIS — I491 Atrial premature depolarization: Secondary | ICD-10-CM

## 2016-08-17 DIAGNOSIS — I6389 Other cerebral infarction: Secondary | ICD-10-CM

## 2016-08-17 DIAGNOSIS — R002 Palpitations: Secondary | ICD-10-CM

## 2016-08-17 DIAGNOSIS — I639 Cerebral infarction, unspecified: Secondary | ICD-10-CM | POA: Insufficient documentation

## 2016-08-17 DIAGNOSIS — I361 Nonrheumatic tricuspid (valve) insufficiency: Secondary | ICD-10-CM | POA: Insufficient documentation

## 2016-08-17 DIAGNOSIS — I638 Other cerebral infarction: Secondary | ICD-10-CM

## 2016-08-17 DIAGNOSIS — I501 Left ventricular failure: Secondary | ICD-10-CM | POA: Insufficient documentation

## 2016-08-17 DIAGNOSIS — I6523 Occlusion and stenosis of bilateral carotid arteries: Secondary | ICD-10-CM

## 2016-08-19 ENCOUNTER — Ambulatory Visit (INDEPENDENT_AMBULATORY_CARE_PROVIDER_SITE_OTHER): Payer: Medicare Other

## 2016-08-19 DIAGNOSIS — R000 Tachycardia, unspecified: Secondary | ICD-10-CM

## 2016-08-19 DIAGNOSIS — R002 Palpitations: Secondary | ICD-10-CM | POA: Diagnosis not present

## 2016-10-06 ENCOUNTER — Telehealth: Payer: Self-pay | Admitting: *Deleted

## 2016-10-06 ENCOUNTER — Other Ambulatory Visit: Payer: Self-pay | Admitting: Family Medicine

## 2016-10-06 MED ORDER — CLONAZEPAM 1 MG PO TABS: 1.0000 mg | ORAL_TABLET | Freq: Every day | ORAL | 1 refills | Status: DC

## 2016-10-06 NOTE — Telephone Encounter (Signed)
Spoke to patient's wife - she is aware the appt has been canceled due to [place] being out sick.  Offered to make new appt.  She will have him call back to reschedule.

## 2016-10-06 NOTE — Progress Notes (Signed)
Refill requested

## 2016-10-07 ENCOUNTER — Other Ambulatory Visit: Payer: Self-pay

## 2016-10-07 ENCOUNTER — Ambulatory Visit: Payer: Self-pay | Admitting: Adult Health

## 2016-10-07 DIAGNOSIS — R002 Palpitations: Secondary | ICD-10-CM

## 2016-10-08 ENCOUNTER — Telehealth: Payer: Self-pay | Admitting: Neurology

## 2016-10-08 DIAGNOSIS — F0632 Mood disorder due to known physiological condition with major depressive-like episode: Secondary | ICD-10-CM | POA: Diagnosis not present

## 2016-10-08 NOTE — Telephone Encounter (Signed)
Patient's wife calling stating last night he started feeling like pins were in his face and his hands were shaking. Today just his hands are shaking

## 2016-10-08 NOTE — Telephone Encounter (Signed)
I spoke to wife. She states that patient had tremors in both hands last night, and the sensation of pins and needles in his face. The tremors or shaking has not been constant. Wife reports that currently the shaking is back in both hands and denies the pin and needle sensation in his face.  Wife asks what they should do?

## 2016-10-09 NOTE — Telephone Encounter (Signed)
He was fully aware of his surroundings and not confused. Not a seizure- He has had tremors before, on and off. He just had his first appointment with a psychiatrist and actually liked the Dr.  I suggested RV with NP. CD

## 2016-10-13 ENCOUNTER — Telehealth: Payer: Self-pay | Admitting: Neurology
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
## 2016-10-14 ENCOUNTER — Telehealth: Payer: Self-pay | Admitting: Family Medicine

## 2016-10-14 DIAGNOSIS — Z125 Encounter for screening for malignant neoplasm of prostate: Secondary | ICD-10-CM

## 2016-10-14 NOTE — Telephone Encounter (Signed)
I need to know what this entails. CD

## 2016-10-14 NOTE — Telephone Encounter (Signed)
Pt would like to have a PSA done may I have a order put in (pt would like to come in on 10/15/16 with wife at 9:15 to have it done)?

## 2016-10-15 NOTE — Telephone Encounter (Signed)
Tried to call but no answer received. I did not leave a voicemail message but I will call again

## 2016-10-15 NOTE — Telephone Encounter (Signed)
Z12.5- I tried to order this for PSA but it didn't cover it.   He can have a PSA but he would have to sign a waiver.   The code that does work is BPH with nocturia but would have to discuss this in person and do rectal to confirm diagnosis.

## 2016-10-16 ENCOUNTER — Encounter: Payer: Self-pay | Admitting: Family Medicine

## 2016-10-16 ENCOUNTER — Ambulatory Visit (INDEPENDENT_AMBULATORY_CARE_PROVIDER_SITE_OTHER): Payer: Medicare HMO | Admitting: Family Medicine

## 2016-10-16 VITALS — BP 128/96 | HR 70 | Temp 98.6°F | Ht 73.5 in | Wt 212.6 lb

## 2016-10-16 DIAGNOSIS — Z125 Encounter for screening for malignant neoplasm of prostate: Secondary | ICD-10-CM | POA: Diagnosis not present

## 2016-10-16 DIAGNOSIS — I10 Essential (primary) hypertension: Secondary | ICD-10-CM

## 2016-10-16 DIAGNOSIS — R197 Diarrhea, unspecified: Secondary | ICD-10-CM

## 2016-10-16 DIAGNOSIS — Z23 Encounter for immunization: Secondary | ICD-10-CM

## 2016-10-16 LAB — CBC WITH DIFFERENTIAL/PLATELET
BASOS PCT: 1 %
Basophils Absolute: 93 cells/uL (ref 0–200)
EOS ABS: 465 {cells}/uL (ref 15–500)
Eosinophils Relative: 5 %
HCT: 44.8 % (ref 38.5–50.0)
Hemoglobin: 15.9 g/dL (ref 13.2–17.1)
Lymphocytes Relative: 32 %
Lymphs Abs: 2976 cells/uL (ref 850–3900)
MCH: 30.8 pg (ref 27.0–33.0)
MCHC: 35.5 g/dL (ref 32.0–36.0)
MCV: 86.7 fL (ref 80.0–100.0)
MONO ABS: 651 {cells}/uL (ref 200–950)
MONOS PCT: 7 %
MPV: 9.7 fL (ref 7.5–12.5)
Neutro Abs: 5115 cells/uL (ref 1500–7800)
Neutrophils Relative %: 55 %
PLATELETS: 230 10*3/uL (ref 140–400)
RBC: 5.17 MIL/uL (ref 4.20–5.80)
RDW: 13.5 % (ref 11.0–15.0)
WBC: 9.3 10*3/uL (ref 3.8–10.8)

## 2016-10-16 LAB — TSH: TSH: 1.02 mIU/L (ref 0.40–4.50)

## 2016-10-16 NOTE — Telephone Encounter (Signed)
Wife called to advise pt has had diarrhea several does and this is a possible IBS flair up.  Also pt wanted a PSA, so will discuss with dr at his appt this afternoon

## 2016-10-16 NOTE — Assessment & Plan Note (Signed)
S: controlled poorly but did not take amlodipine this AM.  BP Readings from Last 3 Encounters:  10/16/16 (!) 128/96  08/12/16 122/86  07/20/16 126/76  A/P:Continue current meds:  encouraged him to take medicine when he gets home- he was wondering if bp meds were what made him feel poorly

## 2016-10-16 NOTE — Progress Notes (Signed)
Pre visit review using our clinic review tool, if applicable. No additional management support is needed unless otherwise documented below in the visit note. 

## 2016-10-16 NOTE — Assessment & Plan Note (Signed)
S:Wants to screen yearly. PSA 2015 0.64. From records. No nocturia or frequency A/P: some BPH on exam but no nocturia- fortunately insurance has changed and is allowing me to process psa with this code. Suspect may have increased with bph

## 2016-10-16 NOTE — Progress Notes (Signed)
Subjective:  Johnathan Sosa is a 71 y.o. year old very pleasant male patient who presents for/with See problem oriented charting ROS- no hot or cold intolerance, no fever or chills, no blood in stool  Past Medical History-  Patient Active Problem List   Diagnosis Date Noted  . History of CVA (cerebrovascular accident) 07/20/2016    Priority: High  . Closed TBI (traumatic brain injury) ([place]) 10/24/2014    Priority: High  . 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 Attention deficit hyperactivity disorder (ADHD) 07/06/2016    Priority: Medium  . Gout     Priority: Medium  . Insomnia     Priority: Medium  . Hyperlipidemia     Priority: Medium  . Depression     Priority: Medium  . HTN (hypertension) 04/29/2012    Priority: Medium  . Former smoker 10/01/2015    Priority: Low  . Erectile dysfunction 10/01/2015    Priority: Low  . Migraine headache     Priority: Low  . Abnormal eye movements 10/24/2014    Priority: Low  . 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 GERD (gastroesophageal reflux disease)     Priority: Low  . Memory loss     Priority: Low  . ADD (attention deficit disorder)     Priority: Low  . Screening for prostate cancer 10/16/2016  . Palpitations 07/20/2016    Medications- reviewed and updated Current Outpatient Prescriptions  Medication Sig Dispense Refill  . allopurinol (ZYLOPRIM) 300 MG tablet Take 1 tablet (300 mg total) by mouth daily. 90 tablet 3  . amLODipine (NORVASC) 5 MG tablet Take 1 tablet (5 mg total) by mouth daily. 90 tablet 3  . atorvastatin (LIPITOR) 20 MG tablet Take 1 tablet (20 mg total) by mouth daily. 90 tablet 3  . buPROPion (WELLBUTRIN XL) 300 MG 24 hr tablet Take 1 tablet (300 mg total) by mouth daily. (Patient taking differently: Take 450 mg by mouth daily. ) 90 tablet 3  . clonazePAM (KLONOPIN) 1 MG tablet Take 1 tablet (1 mg total) by mouth at bedtime. Take 1 tablet at  bedtime PRN for sleep 90 tablet 1  . Multiple Vitamin (MULTIVITAMIN) tablet Take 1 tablet by mouth daily.    . sertraline (ZOLOFT) 50 MG tablet Take 50 mg by mouth daily.     No current facility-administered medications for this visit.     Objective: BP (!) 128/96 (BP Location: Left Arm, Patient Position: Sitting, Cuff Size: Large)   Pulse 70   Temp 98.6 F (37 C) (Oral)   Ht 6' 1.5" (1.867 m)   Wt 212 lb 9.6 oz (96.4 kg)   SpO2 94%   BMI 27.67 kg/m  Gen: NAD, resting comfortably CV: RRR no murmurs rubs or gallops Lungs: CTAB no crackles, wheeze, rhonchi Abdomen: soft/nontender/nondistended/normal bowel sounds. No rebound or guarding.  Rectal: normal tone, diffusely enlarged prostate, no masses or tenderness Ext: no edema Skin: warm, dry  Assessment/Plan:  Need for prophylactic vaccination against Streptococcus pneumoniae (pneumococcus) - Plan: Pneumococcal polysaccharide vaccine 23-valent greater than or equal to 2yo subcutaneous/IM  HTN (hypertension) S: controlled poorly but did not take amlodipine this AM.  BP Readings from Last 3 Encounters:  10/16/16 (!) 128/96  08/12/16 122/86  07/20/16 126/76  A/P:Continue current meds:  encouraged him to take medicine when he gets home- he was wondering if bp meds were what made him feel poorly  Diarrhea  S: diarrhea every morning for 2 weeks. Not unusual to go 3x a day. Usually up to 4 bowel movements right after the coffee- 1st one is very liquidly then firms up. Feels very fatigued most of the time. Does have some abdominal pain that resolves when he has a good bowel movement.   Wonders if it is coffee related- seems to be after 2nd BM. Also loves butter and corn,brussel sprouts, bean.   A/P: suspect may have some underlying IBS> we will trial cutting coffee and if that doesn't improve trial probiotic his wife has. Update Cbc diff, cmet, tsh  Just with his fatigue. Also see avs.   Orders Placed This Encounter  Procedures  .  Pneumococcal polysaccharide vaccine 23-valent greater than or equal to 2yo subcutaneous/IM  . Basic metabolic panel  . CBC with Differential/Platelet  . Hepatic function panel  . TSH  . PSA   Meds ordered this encounter  Medications  . sertraline (ZOLOFT) 50 MG tablet    Sig: Take 50 mg by mouth daily.   The duration of face-to-face time during this visit was greater than 25 minutes. Greater than 50% of this time was spent in counseling, explanation of diagnosis, planning of further management, and/or coordination of care including discussions of why psa is and is not covered, codes that cover this, discussing strain of patient fatigue, discussing patient and his wife's frustration about his lack of walking- he was encouraged to walk daily (use to lift weights but has shoulder arthritis).  Return precautions advised. Prn planned if not improving- see avs [name], MD

## 2016-10-16 NOTE — Patient Instructions (Addendum)
Please stop by lab before you go for psa  I would trial only 1 cup of coffee in AM for a week or so  If no better- trial probiotic  Follow up if not better in 3-4 weeks or if worsens

## 2016-10-17 LAB — HEPATIC FUNCTION PANEL
ALK PHOS: 53 U/L (ref 40–115)
ALT: 20 U/L (ref 9–46)
AST: 15 U/L (ref 10–35)
Albumin: 4.2 g/dL (ref 3.6–5.1)
BILIRUBIN INDIRECT: 0.5 mg/dL (ref 0.2–1.2)
Bilirubin, Direct: 0.1 mg/dL (ref <=0.2)
TOTAL PROTEIN: 6.3 g/dL (ref 6.1–8.1)
Total Bilirubin: 0.6 mg/dL (ref 0.2–1.2)

## 2016-10-17 LAB — PSA: PSA: 1.8 ng/mL (ref <=4.0)

## 2016-10-17 LAB — BASIC METABOLIC PANEL
BUN: 13 mg/dL (ref 7–25)
CHLORIDE: 106 mmol/L (ref 98–110)
CO2: 22 mmol/L (ref 20–31)
Calcium: 9.6 mg/dL (ref 8.6–10.3)
Creat: 1.27 mg/dL — ABNORMAL HIGH (ref 0.70–1.25)
Glucose, Bld: 85 mg/dL (ref 65–99)
POTASSIUM: 4.1 mmol/L (ref 3.5–5.3)
SODIUM: 140 mmol/L (ref 135–146)

## 2016-10-19 NOTE — Telephone Encounter (Signed)
Wife states pt's has no motivation, pt is tired does not want to do anything.  Would like to know if Dr thinks it could be the meds he is on.  Seems like pt's depression is worse last few days.  Would

## 2016-10-21 ENCOUNTER — Other Ambulatory Visit: Payer: Self-pay

## 2016-10-21 DIAGNOSIS — R972 Elevated prostate specific antigen [PSA]: Secondary | ICD-10-CM

## 2016-10-22 NOTE — Telephone Encounter (Signed)
I am booked- He can write me a letter , listing the information he needs from me, or a deposition.
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
## 2016-10-27 ENCOUNTER — Telehealth: Payer: Self-pay | Admitting: Family Medicine

## 2016-10-27 DIAGNOSIS — K529 Noninfective gastroenteritis and colitis, unspecified: Secondary | ICD-10-CM

## 2016-10-27 NOTE — Telephone Encounter (Signed)
Did he cut the coffee down and trial the antibiotic? How many episodes of diarrhea in 24 hours?  If so and did not improve- I can order stool studies for him - he can come by and pick up collection kit for these after I order

## 2016-10-27 NOTE — Telephone Encounter (Signed)
Pt still having issues with diarrhea. Not much improved. Pt is not sure what he needs to do now.  Dr. [name] - Please advise. Thanks!

## 2016-10-27 NOTE — Telephone Encounter (Signed)
[facility] [place] [facility] Patient Name: [name] DOB: 1945/12/06 Initial Comment Caller states Wellbutrin has been increased from 300mg  to 400mg , been having diarrhea for a month, he has no energy Nurse Assessment Nurse: Markus Daft, RN, Sherre Poot Date/Time (Eastern Time): 10/27/2016 1:23:49 PM Confirm and document reason for call. If symptomatic, describe symptoms. ---Caller states states that he is having watery diarrhea. Has had s/s for a month, about 4 times a day. No energy. Sleeping right now. - Advised to stop the Wellbutrin last week at MD visit d/t diarrhea. No change in status. Depression is worse w/o Wellbutrin. - No vomiting/fever. - Gave Lomotil for once in last 2 days which helped for brief period. Does the patient have any new or worsening symptoms? ---No Please document clinical information provided and list any resource used. ---Caller wants to know what to do at this point? Guidelines Guideline Title Affirmed Question Affirmed Notes Final Disposition User Clinical Call [place], RN, [employer]

## 2016-10-28 ENCOUNTER — Encounter: Payer: Self-pay | Admitting: Cardiology

## 2016-10-28 ENCOUNTER — Ambulatory Visit (INDEPENDENT_AMBULATORY_CARE_PROVIDER_SITE_OTHER): Payer: Medicare HMO | Admitting: Adult Health

## 2016-10-28 ENCOUNTER — Ambulatory Visit (INDEPENDENT_AMBULATORY_CARE_PROVIDER_SITE_OTHER): Payer: Medicare HMO | Admitting: Cardiology

## 2016-10-28 ENCOUNTER — Encounter: Payer: Self-pay | Admitting: Adult Health

## 2016-10-28 ENCOUNTER — Encounter (INDEPENDENT_AMBULATORY_CARE_PROVIDER_SITE_OTHER): Payer: Self-pay

## 2016-10-28 VITALS — BP 124/70 | Temp 98.3°F | Ht 73.5 in | Wt 213.2 lb

## 2016-10-28 VITALS — BP 124/78 | HR 78 | Ht 73.5 in | Wt 213.4 lb

## 2016-10-28 DIAGNOSIS — F3341 Major depressive disorder, recurrent, in partial remission: Secondary | ICD-10-CM

## 2016-10-28 DIAGNOSIS — I491 Atrial premature depolarization: Secondary | ICD-10-CM

## 2016-10-28 DIAGNOSIS — R002 Palpitations: Secondary | ICD-10-CM | POA: Diagnosis not present

## 2016-10-28 DIAGNOSIS — R197 Diarrhea, unspecified: Secondary | ICD-10-CM | POA: Diagnosis not present

## 2016-10-28 DIAGNOSIS — R9431 Abnormal electrocardiogram [ECG] [EKG]: Secondary | ICD-10-CM | POA: Diagnosis not present

## 2016-10-28 MED ORDER — DICYCLOMINE HCL 10 MG PO CAPS: 10.0000 mg | ORAL_CAPSULE | Freq: Three times a day (TID) | ORAL | 0 refills | Status: DC

## 2016-10-28 NOTE — Progress Notes (Signed)
   Subjective:    Patient ID: Johnathan Sosa, male    DOB: 08-08-1946, 71 y.o.   MRN: [id number]  HPI  71 year old male, patient of Dr. [name] who I am seeing today for the first time. His complaint today is that of " diarrhea x 1 month." He has seen Dr. [name] for this and is frustrated that he had to come back here today to be seen again.   He originally had seen Dr. [name] on 10/16/2016 at which time he suspected possible underlying IBS. Patient was asked to cut coffee ( which he did) and take a probiotic ( which he did for two weeks). None of this helped.   He continues to have multiple episodes of diarrhea per day. Has some abdominal pain but this usually resolves after BM.   He denies any fevers or feeling ill. He does endorse fatigue   He has not noticed any blood in stool   Review of Systems  See HPI      Objective:   Physical Exam  Constitutional: He is oriented to person, place, and time. He appears well-developed and well-nourished. No distress.  Cardiovascular: Normal rate, regular rhythm, normal heart sounds and intact distal pulses.  Exam reveals no gallop and no friction rub.   No murmur heard. Pulmonary/Chest: Effort normal and breath sounds normal. No respiratory distress. He has no wheezes. He has no rales.  Abdominal: Soft. Bowel sounds are normal. He exhibits no distension and no mass. There is no tenderness. There is no rebound and no guarding.  Musculoskeletal: Normal range of motion.  Neurological: He is alert and oriented to person, place, and time. He has normal reflexes.  Skin: Skin is warm and dry. No rash noted. He is not diaphoretic. No erythema. No pallor.  Psychiatric: He has a normal mood and affect. His behavior is normal. Judgment and thought content normal.  Nursing note and vitals reviewed.     Assessment & Plan:  1. Diarrhea, unspecified type - Stool culture - dicyclomine (BENTYL) 10 MG capsule; Take 1 capsule (10 mg total) by mouth 3 (three) times  daily before meals.  Dispense: 30 capsule; Refill: 0 - C. difficile, PCR - Will refer to Dr. [name] for further care  Dorothyann Peng, NP

## 2016-10-28 NOTE — Patient Instructions (Signed)
Medication Instructions:  None  Labwork: None  Testing/Procedures: Your physician has requested that you have an exercise tolerance test. For further information please visit [email or web address]. Please also follow instruction sheet, as given.    Follow-Up: Your physician recommends that you schedule a follow-up appointment as needed with Dr. Marlou Porch.    Any Other Special Instructions Will Be Listed Below (If Applicable).     If you need a refill on your cardiac medications before your next appointment, please call your pharmacy.
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
## 2016-10-28 NOTE — Telephone Encounter (Signed)
Spoke with both Oron and his wife. They have an appointment today at 2:00 with Dorothyann Peng.

## 2016-10-29 NOTE — Progress Notes (Signed)
Patient notified of Dr. Ansel Bong recommendations & verbalized understanding.

## 2016-10-29 NOTE — Telephone Encounter (Signed)
I spoke to Johnathan Sosa and they will pass on to the attorney.

## 2016-10-30 ENCOUNTER — Telehealth: Payer: Self-pay | Admitting: Family Medicine

## 2016-10-30 LAB — CLOSTRIDIUM DIFFICILE BY PCR: CDIFFPCR: NOT DETECTED

## 2016-10-30 MED ORDER — CIPROFLOXACIN HCL 500 MG PO TABS: 500.0000 mg | ORAL_TABLET | Freq: Two times a day (BID) | ORAL | 0 refills | Status: DC

## 2016-10-30 NOTE — Telephone Encounter (Signed)
Debbie w/ Triage called to advise wife is very concerned about pt due to still having frequent diarrhea, 4 x a day. She reports pt is very weak.  Pt is drinking plenty of fluids. Wife is requesting to get in to a GI dr ASAP.

## 2016-10-30 NOTE — Telephone Encounter (Signed)
Reviewed by phone. C. Diff negative.   Stool culture pending but wants to trial cipro course. Will trial 5 day course.   Also advised immodium if not improving in 36-48 hours.   She asks about lomotil- discussed would trial other 2 agents first.   If worsening symptoms, fever, blood in stool, cannot keep enough fluid down- should go to the ER  Refer to GI but discussed likely will take a few weeks to get in
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
## 2016-10-30 NOTE — Telephone Encounter (Signed)
Dr. [name] is going to call pt. Nothing further needed at this time.

## 2016-11-02 LAB — STOOL CULTURE

## 2016-11-04 NOTE — Telephone Encounter (Signed)
Johnathan Sosa said he was calling Dr D before 1pm today as instructed by her. He will try her cell number

## 2016-11-18 ENCOUNTER — Ambulatory Visit (INDEPENDENT_AMBULATORY_CARE_PROVIDER_SITE_OTHER): Payer: Medicare HMO

## 2016-11-18 DIAGNOSIS — R002 Palpitations: Secondary | ICD-10-CM | POA: Diagnosis not present

## 2016-11-18 DIAGNOSIS — R9431 Abnormal electrocardiogram [ECG] [EKG]: Secondary | ICD-10-CM | POA: Diagnosis not present

## 2016-11-18 LAB — EXERCISE TOLERANCE TEST
CHL CUP MPHR: 152 {beats}/min
CHL CUP RESTING HR STRESS: 87 {beats}/min
CSEPEW: 6.4 METS
CSEPPHR: 15 {beats}/min
Exercise duration (min): 4 min
Exercise duration (sec): 31 s
Percent HR: 100 %
RPE: 15

## 2016-11-23 ENCOUNTER — Telehealth: Payer: Self-pay | Admitting: Cardiology

## 2016-11-23 DIAGNOSIS — F0632 Mood disorder due to known physiological condition with major depressive-like episode: Secondary | ICD-10-CM | POA: Diagnosis not present

## 2016-11-23 NOTE — Telephone Encounter (Signed)
Reviewed results of GXT with pt who states understanding.

## 2016-11-23 NOTE — Telephone Encounter (Signed)
New message   Pt calling stating he is returning call from last week about results.

## 2016-11-24 ENCOUNTER — Encounter: Payer: Self-pay | Admitting: Neurology

## 2016-11-24 ENCOUNTER — Ambulatory Visit (INDEPENDENT_AMBULATORY_CARE_PROVIDER_SITE_OTHER): Payer: Medicare HMO | Admitting: Neurology

## 2016-11-24 VITALS — BP 136/81 | HR 67 | Resp 20 | Ht 73.0 in | Wt 217.0 lb

## 2016-11-24 DIAGNOSIS — R42 Dizziness and giddiness: Secondary | ICD-10-CM

## 2016-11-24 DIAGNOSIS — S069X0A Unspecified intracranial injury without loss of consciousness, initial encounter: Secondary | ICD-10-CM | POA: Diagnosis not present

## 2016-11-24 DIAGNOSIS — S069X9A Unspecified intracranial injury with loss of consciousness of unspecified duration, initial encounter: Principal | ICD-10-CM

## 2016-11-24 DIAGNOSIS — S069XAA Unspecified intracranial injury with loss of consciousness status unknown, initial encounter: Secondary | ICD-10-CM

## 2016-11-24 MED ORDER — ALPRAZOLAM 0.25 MG PO TABS: ORAL_TABLET | ORAL | 0 refills | Status: DC
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
## 2016-11-25 DIAGNOSIS — H4422 Degenerative myopia, left eye: Secondary | ICD-10-CM | POA: Diagnosis not present

## 2016-11-25 DIAGNOSIS — H31092 Other chorioretinal scars, left eye: Secondary | ICD-10-CM | POA: Diagnosis not present

## 2016-11-25 DIAGNOSIS — H2513 Age-related nuclear cataract, bilateral: Secondary | ICD-10-CM | POA: Diagnosis not present

## 2016-11-26 ENCOUNTER — Telehealth: Payer: Self-pay | Admitting: Neurology

## 2016-11-26 NOTE — Telephone Encounter (Signed)
Vestibular rehab if ENT work up is negative. CD

## 2016-11-26 NOTE — Telephone Encounter (Signed)
I called pt's wife. Pt's wife says that pt is "walking around here like he is drunk because of his vertigo". I advised her that Dr. Brett Fairy prefers that pt proceed with ENT appt and then vestibular rehab can be discussed if the ENT cannot help. Pt's wife wants to know if Dr. Brett Fairy can prescribe meclizine for the pt.  I spoke to Dr. Brett Fairy. She says that pt is already taking xanax and klonopin and should take the xanax BID PRN to see if that helps with the vertigo.  I called pt's wife back and explained this. They will try the xanax BID PRN. I also advised her that I would send this message to Hinton Dyer, our referrals coordinator to try and get the ENT appt set up ASAP. Pt's wife is requesting a call back from Hinton Dyer to find our where the ENT referral was sent.

## 2016-11-26 NOTE — Telephone Encounter (Signed)
Referral has been sent to ENT

## 2016-11-26 NOTE — Telephone Encounter (Signed)
I see that you have placed a referral to ENT but did you also want to place a referral to neuro rehab?

## 2016-11-26 NOTE — Telephone Encounter (Signed)
Patients wife [name] called office in reference to inner ear infection.  Per wife she states the medication prescribed is not working and inner ear have gotten worse.  [name] would like to see about the procedure that can be done with PT.  Please call

## 2016-12-15 ENCOUNTER — Encounter: Payer: Self-pay | Admitting: Neurology

## 2016-12-15 DIAGNOSIS — H31092 Other chorioretinal scars, left eye: Secondary | ICD-10-CM | POA: Diagnosis not present

## 2016-12-15 DIAGNOSIS — H35371 Puckering of macula, right eye: Secondary | ICD-10-CM | POA: Diagnosis not present

## 2016-12-15 DIAGNOSIS — H2513 Age-related nuclear cataract, bilateral: Secondary | ICD-10-CM | POA: Diagnosis not present

## 2016-12-16 ENCOUNTER — Ambulatory Visit: Payer: Medicare HMO | Admitting: Gastroenterology

## 2016-12-16 DIAGNOSIS — M9902 Segmental and somatic dysfunction of thoracic region: Secondary | ICD-10-CM | POA: Diagnosis not present

## 2016-12-16 DIAGNOSIS — M62830 Muscle spasm of back: Secondary | ICD-10-CM | POA: Diagnosis not present

## 2016-12-16 DIAGNOSIS — M9903 Segmental and somatic dysfunction of lumbar region: Secondary | ICD-10-CM | POA: Diagnosis not present

## 2016-12-16 DIAGNOSIS — M9901 Segmental and somatic dysfunction of cervical region: Secondary | ICD-10-CM | POA: Diagnosis not present

## 2016-12-16 MED ORDER — FLUOXETINE HCL 10 MG PO CAPS: 10.0000 mg | ORAL_CAPSULE | Freq: Every day | ORAL | 3 refills | Status: DC

## 2016-12-16 NOTE — Telephone Encounter (Signed)
I spoke to wife, she states that she is at her "wits end". Says that she is having chest pain from the stress and told the patient last night that she is moving out because she "cannot take this anymore".   Wife reports that there has been a progressive build up of agitation from the patient.  He is refusing any kind of treatment, will not take his medication for depression. He refuses to see his psychiatrist. He is argumentative and has started to throw things.   Wife asks what should she do? What direction to head in? She is aware that Dr. Brett Fairy is out of the office

## 2016-12-16 NOTE — Telephone Encounter (Signed)
I called the patient, talk with the wife. The patient has had increased stress around tax time, he works as a Engineer, maintenance (IT). He has had some agitation today, he cannot take Zoloft secondary to diarrhea.  He has been a bit more calm recently, clonazepam and alprazolam has been used, he has seen a psychiatrist previously.  I will give him a trial on Prozac at this time.

## 2016-12-16 NOTE — Telephone Encounter (Addendum)
Pt's wife called said he is violent. Michela Pitcher he has thrown things, argumentative, she is scared of him. He took 3 klonopin at 4am and did sleep for a short while. Please call her at [phone]

## 2016-12-16 NOTE — Addendum Note (Signed)
Addended by: Kathrynn Ducking on: 12/16/2016 06:14 PM   Modules accepted: Orders

## 2016-12-18 DIAGNOSIS — R42 Dizziness and giddiness: Secondary | ICD-10-CM | POA: Diagnosis not present

## 2016-12-21 ENCOUNTER — Encounter: Payer: Self-pay | Admitting: Family Medicine

## 2016-12-21 ENCOUNTER — Ambulatory Visit (INDEPENDENT_AMBULATORY_CARE_PROVIDER_SITE_OTHER): Payer: Medicare HMO | Admitting: Family Medicine

## 2016-12-21 DIAGNOSIS — F331 Major depressive disorder, recurrent, moderate: Secondary | ICD-10-CM

## 2016-12-21 DIAGNOSIS — H8111 Benign paroxysmal vertigo, right ear: Secondary | ICD-10-CM

## 2016-12-21 DIAGNOSIS — E7800 Pure hypercholesterolemia, unspecified: Secondary | ICD-10-CM | POA: Diagnosis not present

## 2016-12-21 DIAGNOSIS — H8110 Benign paroxysmal vertigo, unspecified ear: Secondary | ICD-10-CM | POA: Insufficient documentation

## 2016-12-21 DIAGNOSIS — I10 Essential (primary) hypertension: Secondary | ICD-10-CM

## 2016-12-21 NOTE — Assessment & Plan Note (Signed)
S: controlled off of amlodipine- unclear why stopped.  BP Readings from Last 3 Encounters:  12/21/16 (!) 138/92  11/24/16 136/81  10/28/16 124/70  A/P: restart amlodipine 5mg  and follow up 2 months
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
## 2016-12-21 NOTE — Assessment & Plan Note (Addendum)
S: Patient denies depression today and states he feels better when not on meds. 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 He strongly declines restarting this. Diarrhea resolved off of medicine Sertraline (stopped because he states didn't feel depressed).  Lexapro/escitalopram (stopped for unclear reason) Prozac (started by Dr. Jannifer Franklin 5 days ago- he has taken 2 doses) Nortriptyline in past (unclear who started or stopped)  A/P: Patient agrees to trial prozac and follow up 6-8 weeks. Would update phq9 at this point. Does not want to return to psychiatry (for unclear reasons does not trust provider nor does his wife). Neurology has prescribed but will not see for 6 months- thus our sooner follow up.

## 2016-12-21 NOTE — Assessment & Plan Note (Signed)
S: after diarrhea resolved then started having issues with vertigo. Thought likely BPPV per patient and referred to vestibular rehab which is helping. Also given xanax by neurology to help as well as advised OTC meds which he states both have helped some. States issues is only with head movement- ok if resting/keeping head still A/P: continue vestibular rehab exercises- he knows if worsening or does not improve to seek care.

## 2016-12-21 NOTE — Patient Instructions (Signed)
Restart amlodipine. Continue prozac
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
## 2016-12-21 NOTE — Progress Notes (Signed)
Pre visit review using our clinic review tool, if applicable. No additional management support is needed unless otherwise documented below in the visit note. 

## 2016-12-28 DIAGNOSIS — H2512 Age-related nuclear cataract, left eye: Secondary | ICD-10-CM | POA: Diagnosis not present

## 2016-12-30 ENCOUNTER — Telehealth: Payer: Self-pay | Admitting: Neurology

## 2016-12-30 NOTE — Telephone Encounter (Signed)
Patients wife [name] called office in reference to patients psychiatrist given a different diagnosis from Dr. Brett Fairy.  Patients attorney faxed over paperwork for Dr. Brett Fairy to be completed.  Please call

## 2016-12-30 NOTE — Telephone Encounter (Signed)
I called pt's wife to discuss. No answer, VM is full. Will try again later.
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
## 2016-12-31 NOTE — Telephone Encounter (Signed)
Wife called back to check on this status. States that she is going out of town tomorrow but is willing to pick up the information below from our office.   (Also if she signs release form we can fax to her attorney, Laurell Josephs [phone])

## 2016-12-31 NOTE — Telephone Encounter (Signed)
Cannot leave voice mail.

## 2017-01-01 ENCOUNTER — Telehealth: Payer: Self-pay | Admitting: Neurology

## 2017-01-01 ENCOUNTER — Encounter: Payer: Self-pay | Admitting: Neurology

## 2017-01-01 ENCOUNTER — Encounter: Payer: Self-pay | Admitting: *Deleted
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
## 2017-01-19 ENCOUNTER — Encounter: Payer: Self-pay | Admitting: Gastroenterology

## 2017-01-26 ENCOUNTER — Encounter: Payer: Self-pay | Admitting: Neurology

## 2017-01-26 ENCOUNTER — Ambulatory Visit (INDEPENDENT_AMBULATORY_CARE_PROVIDER_SITE_OTHER): Payer: Medicare HMO | Admitting: Neurology

## 2017-01-26 DIAGNOSIS — S069X0A Unspecified intracranial injury without loss of consciousness, initial encounter: Secondary | ICD-10-CM | POA: Diagnosis not present

## 2017-01-26 DIAGNOSIS — S069X9A Unspecified intracranial injury with loss of consciousness of unspecified duration, initial encounter: Principal | ICD-10-CM

## 2017-01-26 DIAGNOSIS — S069XAA Unspecified intracranial injury with loss of consciousness status unknown, initial encounter: Secondary | ICD-10-CM

## 2017-01-26 DIAGNOSIS — R42 Dizziness and giddiness: Secondary | ICD-10-CM | POA: Diagnosis not present

## 2017-01-26 MED ORDER — ALPRAZOLAM 0.25 MG PO TABS: ORAL_TABLET | ORAL | 0 refills | Status: DC
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
## 2017-01-27 DIAGNOSIS — N5201 Erectile dysfunction due to arterial insufficiency: Secondary | ICD-10-CM | POA: Diagnosis not present

## 2017-01-27 DIAGNOSIS — R972 Elevated prostate specific antigen [PSA]: Secondary | ICD-10-CM | POA: Diagnosis not present

## 2017-02-05 DIAGNOSIS — H2511 Age-related nuclear cataract, right eye: Secondary | ICD-10-CM | POA: Diagnosis not present

## 2017-02-05 DIAGNOSIS — H52223 Regular astigmatism, bilateral: Secondary | ICD-10-CM | POA: Diagnosis not present

## 2017-02-05 DIAGNOSIS — Z961 Presence of intraocular lens: Secondary | ICD-10-CM | POA: Diagnosis not present

## 2017-02-08 DIAGNOSIS — H25811 Combined forms of age-related cataract, right eye: Secondary | ICD-10-CM | POA: Diagnosis not present

## 2017-02-08 DIAGNOSIS — H2511 Age-related nuclear cataract, right eye: Secondary | ICD-10-CM | POA: Diagnosis not present

## 2017-02-16 DIAGNOSIS — M9903 Segmental and somatic dysfunction of lumbar region: Secondary | ICD-10-CM | POA: Diagnosis not present

## 2017-02-16 DIAGNOSIS — M9901 Segmental and somatic dysfunction of cervical region: Secondary | ICD-10-CM | POA: Diagnosis not present

## 2017-02-16 DIAGNOSIS — M62830 Muscle spasm of back: Secondary | ICD-10-CM | POA: Diagnosis not present

## 2017-02-16 DIAGNOSIS — M9902 Segmental and somatic dysfunction of thoracic region: Secondary | ICD-10-CM | POA: Diagnosis not present

## 2017-02-18 DIAGNOSIS — M9901 Segmental and somatic dysfunction of cervical region: Secondary | ICD-10-CM | POA: Diagnosis not present

## 2017-02-18 DIAGNOSIS — M9902 Segmental and somatic dysfunction of thoracic region: Secondary | ICD-10-CM | POA: Diagnosis not present

## 2017-02-18 DIAGNOSIS — M62830 Muscle spasm of back: Secondary | ICD-10-CM | POA: Diagnosis not present

## 2017-02-18 DIAGNOSIS — M9903 Segmental and somatic dysfunction of lumbar region: Secondary | ICD-10-CM | POA: Diagnosis not present

## 2017-02-19 ENCOUNTER — Encounter: Payer: Self-pay | Admitting: Family Medicine

## 2017-02-19 ENCOUNTER — Ambulatory Visit (INDEPENDENT_AMBULATORY_CARE_PROVIDER_SITE_OTHER): Payer: Medicare HMO | Admitting: Family Medicine

## 2017-02-19 ENCOUNTER — Telehealth: Payer: Self-pay | Admitting: Family Medicine

## 2017-02-19 VITALS — BP 134/92 | HR 91 | Temp 98.0°F | Ht 73.5 in | Wt 221.2 lb

## 2017-02-19 DIAGNOSIS — M545 Low back pain, unspecified: Secondary | ICD-10-CM

## 2017-02-19 DIAGNOSIS — F331 Major depressive disorder, recurrent, moderate: Secondary | ICD-10-CM

## 2017-02-19 MED ORDER — FLUOXETINE HCL 10 MG PO TABS: 10.0000 mg | ORAL_TABLET | Freq: Every day | ORAL | 3 refills | Status: DC

## 2017-02-19 MED ORDER — TIZANIDINE HCL 2 MG PO TABS: 2.0000 mg | ORAL_TABLET | Freq: Three times a day (TID) | ORAL | 0 refills | Status: DC | PRN

## 2017-02-19 NOTE — Assessment & Plan Note (Signed)
S: Patient has had minimal concern that he is depressed. Wife clearly notes despressed mood, increased pessimism in him. She requests that he restart medication but the problem in he often stops the medications. Has seen psychiatry Dr. Casimiro Needle but does not want to return  Reviewed following trials: wellbutrin (had been on 300mg  and then 450 a day- then diarrhea started and he stopped). He strongly declines restarting this. Diarrhea resolved off of medicine. He states libido was not present.  Sertraline (stopped because he states didn't feel depressed).  Lexapro/escitalopram (stopped for unclear reason) Prozac (started by Dr. Jannifer Franklin a few months ago- stopped for unclear reasons) Nortriptyline in past (unclear who started or stopped)  A/P: compliance has been a huge issues. Will retrial prozac as he seems to be fearful of other medication options and side effects. Recheck 6 weeks- offered sooner but not interested. No SI and gave emergent return precautions  I think a lot of his issues with personality change could be from prior frontal lobe injury

## 2017-02-19 NOTE — Progress Notes (Signed)
Subjective:  Johnathan Sosa is a 71 y.o. year old very pleasant male patient who presents for/with See problem oriented charting ROS-No saddle anesthesia, bladder incontinence, fecal incontinence, weakness in extremity, numbness or tingling in extremity.   Past Medical History-  Patient Active Problem List   Diagnosis Date Noted  . History of CVA (cerebrovascular accident) 07/20/2016    Priority: High  . Closed TBI (traumatic brain injury) ([place]) 10/24/2014    Priority: High  . 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 Attention deficit hyperactivity disorder (ADHD) 07/06/2016    Priority: Medium  . Gout     Priority: Medium  . Insomnia     Priority: Medium  . Hyperlipidemia     Priority: Medium  . Depression     Priority: Medium  . HTN (hypertension) 04/29/2012    Priority: Medium  . Former smoker 10/01/2015    Priority: Low  . Erectile dysfunction 10/01/2015    Priority: Low  . Migraine headache     Priority: Low  . Abnormal eye movements 10/24/2014    Priority: Low  . 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 GERD (gastroesophageal reflux disease)     Priority: Low  . Memory loss     Priority: Low  . ADD (attention deficit disorder)     Priority: Low  . 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 BPPV (benign paroxysmal positional vertigo) 12/21/2016  . Screening for prostate cancer 10/16/2016  . Palpitations 07/20/2016    Medications- reviewed and updated Current Outpatient Prescriptions  Medication Sig Dispense Refill  . allopurinol (ZYLOPRIM) 300 MG tablet Take 300 mg by mouth daily.    [name] Kitchen amLODipine (NORVASC) 5 MG tablet Take 5 mg by mouth daily.    . clonazePAM (KLONOPIN) 1 MG tablet Take 1 tablet (1 mg total) by mouth at bedtime. Take 1 tablet at bedtime PRN for sleep 90 tablet 1  . ALPRAZolam (XANAX) 0.25 MG tablet Prn use for VERTIGO po, do not exceed 2 a day. (Patient not taking: Reported on  02/19/2017) 90 tablet 0   No current facility-administered medications for this visit.     Objective: BP (!) 134/92 (BP Location: Left Arm, Patient Position: Sitting, Cuff Size: Large)   Pulse 91   Temp 98 F (36.7 C) (Oral)   Ht 6' 1.5" (1.867 m)   Wt 221 lb 3.2 oz (100.3 kg)   SpO2 94%   BMI 28.79 kg/m  Gen: NAD, resting comfortably CV: RRR no murmurs rubs or gallops Lungs: CTAB no crackles, wheeze, rhonchi Abdomen: soft/nontender/nondistended/normal bowel sounds. No rebound or guarding.  Ext: no edema Skin: warm, dry  Back - Normal skin, Spine with normal alignment and no deformity.  No tenderness to vertebral process palpation.  Paraspinous muscles are very tender and with spasm to right of spinal column.   Range of motion is full at neck but limited in lumbar sacral regions due to pain. Negative Straight leg raise.  Neuro- no saddle anesthesia, 5/5 strength lower extremities, 2+ reflexes  Assessment/Plan:  Acute right-sided low back pain without sciatica - Plan: Ambulatory referral to Orthopedics S: 10 days ago patient was starting lawnmower and pulled his back out when pulling the start. Went to chiropractor twice with only minimal relief - left side of back has hurt as well and now much improved. He started on mobic 7.5 mg BID which he had at home. Had vicodin  from prior dental procedure and taking up to 3x a day (advised against for back pain). Does feel very tight on that side. Severe pai up to 9/10 worse with movement. Treatments have helped slightly. Pain does not seem to be improving.  A/P: Right low back pain after pulling lawnmower cord- suspect muscular in nature- no radicular symptoms. They requested ortho referral. We discussed likely up to 6 weeks of symptoms after severely pulling back out lke he did Patient Instructions  We will call you within a week or two about your referral to [facility]. If you do not hear within 3 weeks, give [place] a call.   Trial  zanaflex for back spasms  Start fluoxetine 10mg  back. See me back in 6 weeks to reevaluate. Immediately if thoughts of harming yourself develop   Depression S: Patient has had minimal concern that he is depressed. Wife clearly notes despressed mood, increased pessimism in him. She requests that he restart medication but the problem in he often stops the medications. Has seen psychiatry Dr. Casimiro Needle but does not want to return  Reviewed following trials: wellbutrin (had been on 300mg  and then 450 a day- then diarrhea started and he stopped). He strongly declines restarting this. Diarrhea resolved off of medicine. He states libido was not present.  Sertraline (stopped because he states didn't feel depressed).  Lexapro/escitalopram (stopped for unclear reason) Prozac (started by Dr. Jannifer Franklin a few months ago- stopped for unclear reasons) Nortriptyline in past (unclear who started or stopped)  A/P: compliance has been a huge issues. Will retrial prozac as he seems to be fearful of other medication options and side effects. Recheck 6 weeks- offered sooner but not interested. No SI and gave emergent return precautions  I think a lot of his issues with personality change could be from prior frontal lobe injury  BP up due to pain- will monitor  6 weeks  Orders Placed This Encounter  Procedures  . Ambulatory referral to Orthopedics    Referral Priority:   Routine    Referral Type:   Consultation   Unclear why had to be reordered by after hours physician as stated received by pharmacy  Meds ordered this encounter  Medications  . DISCONTD: tiZANidine (ZANAFLEX) 2 MG tablet    Sig: Take 1 tablet (2 mg total) by mouth every 8 (eight) hours as needed for muscle spasms.    Dispense:  30 tablet    Refill:  0  . DISCONTD: FLUoxetine (PROZAC) 10 MG tablet    Sig: Take 1 tablet (10 mg total) by mouth daily.    Dispense:  30 tablet    Refill:  3    Return precautions advised.  [name],  MD

## 2017-02-19 NOTE — Telephone Encounter (Signed)
Spoke with team health regarding patient. They report patient is at the pharmacy and was supposed to have had a depression medication and a muscle relaxer [facility] [address]. She provided me with the phone number which I confirmed in the computer. I reviewed the office visit from today and it appears Dr. [name] sent in Prozac and Zanaflex. These were resent to the patient's pharmacy. FYI to patient's PCP.

## 2017-02-19 NOTE — Patient Instructions (Signed)
We will call you within a week or two about your referral to [facility]. If you do not hear within 3 weeks, give [place] a call.   Trial zanaflex for back spasms  Start fluoxetine 10mg  back. See me back in 6 weeks to reevaluate. Immediately if thoughts of harming yourself develop

## 2017-02-22 ENCOUNTER — Ambulatory Visit: Payer: Medicare HMO | Admitting: Family Medicine

## 2017-02-23 DIAGNOSIS — M549 Dorsalgia, unspecified: Secondary | ICD-10-CM | POA: Diagnosis not present

## 2017-02-24 ENCOUNTER — Telehealth: Payer: Self-pay | Admitting: Family Medicine

## 2017-02-24 DIAGNOSIS — R972 Elevated prostate specific antigen [PSA]: Secondary | ICD-10-CM | POA: Diagnosis not present

## 2017-02-24 DIAGNOSIS — R5383 Other fatigue: Secondary | ICD-10-CM

## 2017-02-24 LAB — PSA: PSA: 2.26

## 2017-02-24 NOTE — Telephone Encounter (Signed)
Pt saw [facility] yesterday and wife states they advised  If Dr [name] could order a full thyroid panel, vit D & B to rule out these things. Pt's bones were ok.  They put pt on prednisone.pt still in much pain. Please advise.

## 2017-02-25 ENCOUNTER — Other Ambulatory Visit: Payer: Self-pay

## 2017-02-25 DIAGNOSIS — R5383 Other fatigue: Secondary | ICD-10-CM

## 2017-02-25 NOTE — Telephone Encounter (Signed)
Dr. [name], please advise. Thanks!

## 2017-02-25 NOTE — Telephone Encounter (Signed)
Please order tsh, free t4, free t3, vitamin D and vitamin B12 under fatigue. Hopeful insurance will cover this- it appeared to go through when I trialed it

## 2017-02-25 NOTE — Telephone Encounter (Signed)
Labs ordered and patient scheduled for lab

## 2017-02-26 ENCOUNTER — Other Ambulatory Visit (INDEPENDENT_AMBULATORY_CARE_PROVIDER_SITE_OTHER): Payer: Medicare HMO

## 2017-02-26 ENCOUNTER — Other Ambulatory Visit: Payer: Medicare HMO

## 2017-02-26 DIAGNOSIS — R5383 Other fatigue: Secondary | ICD-10-CM

## 2017-02-26 LAB — VITAMIN B12: Vitamin B-12: 352 pg/mL (ref 211–911)

## 2017-02-26 LAB — TSH: TSH: 0.78 u[IU]/mL (ref 0.35–4.50)

## 2017-02-26 LAB — T3, FREE: T3 FREE: 2.6 pg/mL (ref 2.3–4.2)

## 2017-02-26 LAB — T4, FREE: Free T4: 0.73 ng/dL (ref 0.60–1.60)

## 2017-02-26 LAB — VITAMIN D 25 HYDROXY (VIT D DEFICIENCY, FRACTURES): VITD: 22.3 ng/mL — ABNORMAL LOW (ref 30.00–100.00)

## 2017-03-01 ENCOUNTER — Other Ambulatory Visit: Payer: Self-pay

## 2017-03-01 DIAGNOSIS — N5311 Retarded ejaculation: Secondary | ICD-10-CM | POA: Diagnosis not present

## 2017-03-01 DIAGNOSIS — R972 Elevated prostate specific antigen [PSA]: Secondary | ICD-10-CM | POA: Diagnosis not present

## 2017-03-01 DIAGNOSIS — N5201 Erectile dysfunction due to arterial insufficiency: Secondary | ICD-10-CM | POA: Diagnosis not present

## 2017-03-01 MED ORDER — VITAMIN D (ERGOCALCIFEROL) 1.25 MG (50000 UNIT) PO CAPS: 50000.0000 [IU] | ORAL_CAPSULE | ORAL | 0 refills | Status: DC

## 2017-03-02 ENCOUNTER — Telehealth: Payer: Self-pay | Admitting: Family Medicine

## 2017-03-02 NOTE — Telephone Encounter (Signed)
Called the pharmacy and spoke to a tech there who confirmed that the prescription is ready for pick up. THey received it yesterday. I will call and let him know.

## 2017-03-02 NOTE — Telephone Encounter (Signed)
New Rx  Vitamin D, Ergocalciferol, (DRISDOL) 50000 units CAPS capsule  Is not at the pharmacy. They are going out of town and need asap.  I spoke with the pharmacy, they do not have. Can you resend? Thank you!!  [id number] - [place], [place] - [address]

## 2017-03-10 ENCOUNTER — Ambulatory Visit: Payer: Medicare HMO | Admitting: Family Medicine

## 2017-03-23 ENCOUNTER — Telehealth: Payer: Self-pay | Admitting: Family Medicine

## 2017-03-23 NOTE — Telephone Encounter (Signed)
Pt request refill  clonazePAM (KLONOPIN) 1 MG tablet\  90 day [name], [name]  (please note note new pharmacy)  Pt states he is almost out, has to take 2 /day sometimes. Please send asap

## 2017-03-23 NOTE — Telephone Encounter (Signed)
Ok to refill? This puts him running out early by almost 2.5 weeks

## 2017-03-23 NOTE — Telephone Encounter (Signed)
May refill early but this medicine was only intended for sleep- he needs to return to using for sleep (this will not be refilled early again)- if anxiety is that poorly controlled he needs to return to psychiatry to discuss other options for control.

## 2017-03-24 NOTE — Telephone Encounter (Signed)
Spoke with wife [name] who states he has an appointment on Friday. Wife states she thinks he has enough to last until Friday so that it can be reviewed then

## 2017-03-26 ENCOUNTER — Ambulatory Visit (INDEPENDENT_AMBULATORY_CARE_PROVIDER_SITE_OTHER): Payer: Medicare HMO | Admitting: Family Medicine

## 2017-03-26 ENCOUNTER — Encounter: Payer: Self-pay | Admitting: Family Medicine

## 2017-03-26 VITALS — BP 102/78 | HR 92 | Temp 98.4°F | Ht 73.5 in | Wt 216.2 lb

## 2017-03-26 DIAGNOSIS — M10079 Idiopathic gout, unspecified ankle and foot: Secondary | ICD-10-CM | POA: Diagnosis not present

## 2017-03-26 DIAGNOSIS — G4700 Insomnia, unspecified: Secondary | ICD-10-CM | POA: Diagnosis not present

## 2017-03-26 DIAGNOSIS — F3342 Major depressive disorder, recurrent, in full remission: Secondary | ICD-10-CM | POA: Diagnosis not present

## 2017-03-26 DIAGNOSIS — I10 Essential (primary) hypertension: Secondary | ICD-10-CM

## 2017-03-26 MED ORDER — CLONAZEPAM 1 MG PO TABS: 1.0000 mg | ORAL_TABLET | Freq: Every day | ORAL | 1 refills | Status: DC

## 2017-03-26 NOTE — Assessment & Plan Note (Signed)
S: Patient vehemently denies depression. States getting out more. Declines depressed mood or anhedonia (only states gets frustrated with some tasks due to vision changes after cataract surgery). He did not take prozac. Klonopin for sleep only- has taken 2 at times- discussed he may not do this  Reviewed following trials: wellbutrin (had been on 300mg  and then 450 a day- then diarrhea started and he stopped). He strongly declines restarting this. Diarrhea resolved off of medicine Sertraline (stopped because he states didn't feel depressed).  Lexapro/escitalopram (stopped for unclear reason) Prozac (started by Dr. Jannifer Franklin 5 days ago- he has taken 2 doses max then started) Nortriptyline in past (unclear who started or stopped) We retrialed prozac- but he did not take despite advisement A/P: PHQ2 of 0. Cannot force him to take prozac but does legitimately seem in better spirits today. Hopeful this continues. Took prozac off med list refill klonopin but discussed 1 max per day.

## 2017-03-26 NOTE — Assessment & Plan Note (Signed)
Refill klonopin. Discussed 1 a day maximum

## 2017-03-26 NOTE — Patient Instructions (Addendum)
Continue current medicines.   Refilled klonopin

## 2017-03-26 NOTE — Progress Notes (Signed)
Subjective:  Johnathan Sosa is a 71 y.o. year old very pleasant male patient who presents for/with See problem oriented charting ROS- vertigo intermittent. No chest pain or shortness of breath. Improved back pain.    Past Medical History-  Patient Active Problem List   Diagnosis Date Noted  . History of CVA (cerebrovascular accident) 07/20/2016    Priority: High  . Closed TBI (traumatic brain injury) ([place]) 10/24/2014    Priority: High  . 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 Attention deficit hyperactivity disorder (ADHD) 07/06/2016    Priority: Medium  . Gout     Priority: Medium  . Insomnia     Priority: Medium  . Hyperlipidemia     Priority: Medium  . Depression     Priority: Medium  . HTN (hypertension) 04/29/2012    Priority: Medium  . Former smoker 10/01/2015    Priority: Low  . Erectile dysfunction 10/01/2015    Priority: Low  . Migraine headache     Priority: Low  . Abnormal eye movements 10/24/2014    Priority: Low  . 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 GERD (gastroesophageal reflux disease)     Priority: Low  . Memory loss     Priority: Low  . ADD (attention deficit disorder)     Priority: Low  . 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 BPPV (benign paroxysmal positional vertigo) 12/21/2016  . Screening for prostate cancer 10/16/2016  . Palpitations 07/20/2016    Medications- reviewed and updated Current Outpatient Prescriptions  Medication Sig Dispense Refill  . allopurinol (ZYLOPRIM) 300 MG tablet Take 300 mg by mouth daily.    [name] Kitchen amLODipine (NORVASC) 5 MG tablet Take 5 mg by mouth daily.    . clonazePAM (KLONOPIN) 1 MG tablet Take 1 tablet (1 mg total) by mouth at bedtime. Take 1 tablet at bedtime PRN for sleep 90 tablet 1  . Vitamin D, Ergocalciferol, (DRISDOL) 50000 units CAPS capsule Take 1 capsule (50,000 Units total) by mouth every 7 (seven) days. 12 capsule 0  .  ALPRAZolam (XANAX) 0.25 MG tablet Prn use for VERTIGO po, do not exceed 2 a day. (Patient not taking: Reported on 02/19/2017) 90 tablet 0   Objective: BP 102/78 (BP Location: Left Arm, Patient Position: Sitting, Cuff Size: Large)   Pulse 92   Temp 98.4 F (36.9 C) (Oral)   Ht 6' 1.5" (1.867 m)   Wt 216 lb 3.2 oz (98.1 kg)   SpO2 93%   BMI 28.14 kg/m  Gen: NAD, resting comfortably CV: RRR no murmurs rubs or gallops Lungs: CTAB no crackles, wheeze, rhonchi Abdomen: soft/nontender/nondistended/normal bowel sounds. overweight Ext: no edema Skin: warm, dry Brighter affect, argues with wife at times  Assessment/Plan:  Back pain improved- he thinks massage helped the most  Depression S: Patient vehemently denies depression. States getting out more. Declines depressed mood or anhedonia (only states gets frustrated with some tasks due to vision changes after cataract surgery). He did not take prozac. Klonopin for sleep only- has taken 2 at times- discussed he may not do this  Reviewed following trials: wellbutrin (had been on 300mg  and then 450 a day- then diarrhea started and he stopped). He strongly declines restarting this. Diarrhea resolved off of medicine Sertraline (stopped because he states didn't feel depressed).  Lexapro/escitalopram (stopped for unclear reason) Prozac (started by Dr. Jannifer Sosa 5 days ago- he has taken 2 doses  max then started) Nortriptyline in past (unclear who started or stopped) We retrialed prozac- but he did not take despite advisement A/P: PHQ2 of 0. Cannot force him to take prozac but does legitimately seem in better spirits today. Hopeful this continues. Took prozac off med list refill klonopin but discussed 1 max per day.    HTN (hypertension) S: controlled on amlodipine 5mg  now that he is not in back pain.  BP Readings from Last 3 Encounters:  03/26/17 102/78  02/19/17 (!) 134/92  01/26/17 122/82  A/P: We discussed blood pressure goal of <140/90.  Continue current meds   Gout S: compliant with allopurinol 300mg . No gout flares on this A/P: continue current rx   Insomnia Refill klonopin. Discussed 1 a day maximum  Patient to start exercising by walking again and getting back to gym to show that depression has resolved  Return in about 3 months (around 06/26/2017) for physical, com efasting. .  Meds ordered this encounter  Medications  . clonazePAM (KLONOPIN) 1 MG tablet    Sig: Take 1 tablet (1 mg total) by mouth at bedtime. Take 1 tablet at bedtime PRN for sleep    Dispense:  90 tablet    Refill:  1   Return precautions advised.  [name], MD

## 2017-03-26 NOTE — Assessment & Plan Note (Signed)
S: controlled on amlodipine 5mg  now that he is not in back pain.  BP Readings from Last 3 Encounters:  03/26/17 102/78  02/19/17 (!) 134/92  01/26/17 122/82  A/P: We discussed blood pressure goal of <140/90. Continue current meds

## 2017-03-26 NOTE — Assessment & Plan Note (Signed)
S: compliant with allopurinol 300mg . No gout flares on this A/P: continue current rx

## 2017-04-09 DIAGNOSIS — Z961 Presence of intraocular lens: Secondary | ICD-10-CM | POA: Diagnosis not present

## 2017-04-30 ENCOUNTER — Telehealth: Payer: Self-pay | Admitting: Neurology

## 2017-04-30 DIAGNOSIS — F02818 Dementia in other diseases classified elsewhere, unspecified severity, with other behavioral disturbance: Secondary | ICD-10-CM

## 2017-04-30 DIAGNOSIS — S069X1A Unspecified intracranial injury with loss of consciousness of 30 minutes or less, initial encounter: Secondary | ICD-10-CM

## 2017-04-30 DIAGNOSIS — F4489 Other dissociative and conversion disorders: Secondary | ICD-10-CM

## 2017-04-30 DIAGNOSIS — F0281 Dementia in other diseases classified elsewhere with behavioral disturbance: Secondary | ICD-10-CM

## 2017-04-30 DIAGNOSIS — F321 Major depressive disorder, single episode, moderate: Secondary | ICD-10-CM

## 2017-04-30 NOTE — Telephone Encounter (Signed)
Pt's wife called said he went to pick up a friend today (he has been to their house many times), he called her and said "I am lost, how do I get there" (this is a new symptom). She said he is defensive, angry, rude, table manners have changed, will not take any medications and still has vertigo, having problems with finances, sleeping more. She is wanting to update Dr Brett Fairy with his changes that they are getting worse. She said he is gone until 4 pm today, she cannot talk about this in front of him. She is aware the clinic closes at noon today and is closed on Monday (Labor Day).

## 2017-05-05 ENCOUNTER — Telehealth: Payer: Self-pay | Admitting: Neurology

## 2017-05-05 DIAGNOSIS — IMO0001 Reserved for inherently not codable concepts without codable children: Secondary | ICD-10-CM

## 2017-05-05 DIAGNOSIS — G3184 Mild cognitive impairment, so stated: Secondary | ICD-10-CM

## 2017-05-05 NOTE — Telephone Encounter (Signed)
I will refer to Dr. Si Raider for further testing. Mrs. Ankney agreed. CD

## 2017-05-05 NOTE — Telephone Encounter (Signed)
Patient is very nervous, travel to [place] next week.  Misplaced keys to safe deposit box. Confusion. Amnestic spells, personality changes, defensive- I had briefly spoken to him. He was confused. He is not longer able to use the navigation system in his car. Has trouble using the cell phone.  He voiced concern about vertigo while on his cruise. I told him to use prn xanax. He thanked me and hung up. cd

## 2017-05-06 ENCOUNTER — Encounter: Payer: Self-pay | Admitting: Psychology

## 2017-05-06 NOTE — Telephone Encounter (Signed)
Noted Referral has been sent via Epic to Dr. Si Raider. Thanks Hinton Dyer

## 2017-05-26 DIAGNOSIS — Z961 Presence of intraocular lens: Secondary | ICD-10-CM | POA: Diagnosis not present

## 2017-06-02 ENCOUNTER — Ambulatory Visit (INDEPENDENT_AMBULATORY_CARE_PROVIDER_SITE_OTHER): Payer: Medicare HMO | Admitting: Family Medicine

## 2017-06-02 ENCOUNTER — Encounter: Payer: Self-pay | Admitting: Family Medicine

## 2017-06-02 VITALS — BP 104/80 | HR 88 | Temp 98.5°F | Ht 73.5 in | Wt 212.4 lb

## 2017-06-02 DIAGNOSIS — S069X0A Unspecified intracranial injury without loss of consciousness, initial encounter: Secondary | ICD-10-CM

## 2017-06-02 DIAGNOSIS — Z23 Encounter for immunization: Secondary | ICD-10-CM

## 2017-06-02 DIAGNOSIS — S069XAA Unspecified intracranial injury with loss of consciousness status unknown, initial encounter: Secondary | ICD-10-CM

## 2017-06-02 DIAGNOSIS — M10079 Idiopathic gout, unspecified ankle and foot: Secondary | ICD-10-CM

## 2017-06-02 DIAGNOSIS — S069X9A Unspecified intracranial injury with loss of consciousness of unspecified duration, initial encounter: Secondary | ICD-10-CM

## 2017-06-02 DIAGNOSIS — I10 Essential (primary) hypertension: Secondary | ICD-10-CM | POA: Diagnosis not present

## 2017-06-02 DIAGNOSIS — R42 Dizziness and giddiness: Secondary | ICD-10-CM

## 2017-06-02 DIAGNOSIS — E7800 Pure hypercholesterolemia, unspecified: Secondary | ICD-10-CM

## 2017-06-02 MED ORDER — AMLODIPINE BESYLATE 2.5 MG PO TABS: 2.5000 mg | ORAL_TABLET | Freq: Every day | ORAL | 3 refills | Status: DC

## 2017-06-02 MED ORDER — ATORVASTATIN CALCIUM 10 MG PO TABS: 10.0000 mg | ORAL_TABLET | Freq: Every day | ORAL | 3 refills | Status: DC

## 2017-06-02 NOTE — Patient Instructions (Addendum)
Trial amlodipine 2.5 mg (down from 5mg  given how good your blood pressure has looked)  Restart atorvastatin 10mg 

## 2017-06-02 NOTE — Assessment & Plan Note (Signed)
S: vertigo has improved  Significantly on xanax 0.25 mg twice a day per Dr. Brett Fairy.  A/P: patient plans to continue- I discussed with him using on as needed basis instead of scheduled as he has been using (just started within last few weeks) and following up with Dr. Brett Fairy

## 2017-06-02 NOTE — Assessment & Plan Note (Signed)
S: no flare ups of gout on allopurinol 300mg .  A/P: Continue current medication and get uric acid level at follow-

## 2017-06-02 NOTE — Assessment & Plan Note (Signed)
S: Patient had been on atorvastatin 10mg  but he has stopped this. Last LDL was 63 A/P: encouraged restart of atorvastatin 10mg - with goal bad cholesterol/LDL of 70 or less. Sent back in

## 2017-06-02 NOTE — Assessment & Plan Note (Signed)
S: controlled on amlodipine 5mg  BP Readings from Last 3 Encounters:  06/02/17 104/80  03/26/17 102/78  02/19/17 (!) 134/92  A/P: We discussed blood pressure goal of <140/90. Continue current meds:  But reduce to 2.5mg  amlodipine

## 2017-06-02 NOTE — Progress Notes (Signed)
Subjective:  Johnathan Sosa is a 71 y.o. year old very pleasant male patient who presents for/with See problem oriented charting ROS- denies depressed mood. Vertigo is better On Xanax. Denies chest pain. Does get fatigued more easily than he used to and when he was working out regularly  Past Medical History-  Patient Active Problem List   Diagnosis Date Noted  . History of CVA (cerebrovascular accident) 07/20/2016    Priority: High  . Closed TBI (traumatic brain injury) ([place]) 10/24/2014    Priority: High  . 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 Attention deficit hyperactivity disorder (ADHD) 07/06/2016    Priority: Medium  . Gout     Priority: Medium  . Insomnia     Priority: Medium  . Hyperlipidemia     Priority: Medium  . Depression     Priority: Medium  . HTN (hypertension) 04/29/2012    Priority: Medium  . Former smoker 10/01/2015    Priority: Low  . Erectile dysfunction 10/01/2015    Priority: Low  . Migraine headache     Priority: Low  . Abnormal eye movements 10/24/2014    Priority: Low  . 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 GERD (gastroesophageal reflux disease)     Priority: Low  . Memory loss     Priority: Low  . ADD (attention deficit disorder)     Priority: Low  . 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 BPPV (benign paroxysmal positional vertigo) 12/21/2016  . Screening for prostate cancer 10/16/2016  . Palpitations 07/20/2016    Medications- reviewed and updated Current Outpatient Prescriptions  Medication Sig Dispense Refill  . allopurinol (ZYLOPRIM) 300 MG tablet Take 300 mg by mouth daily.    [name] Kitchen ALPRAZolam (XANAX) 0.25 MG tablet Prn use for VERTIGO po, do not exceed 2 a day. (Patient not taking: Reported on 02/19/2017) 90 tablet 0  . amLODipine (NORVASC) 5 MG tablet Take 5 mg by mouth daily.    . clonazePAM (KLONOPIN) 1 MG tablet Take 1 tablet (1 mg total) by mouth at  bedtime. Take 1 tablet at bedtime PRN for sleep 90 tablet 1  . Vitamin D, Ergocalciferol, (DRISDOL) 50000 units CAPS capsule Take 1 capsule (50,000 Units total) by mouth every 7 (seven) days. 12 capsule 0   No current facility-administered medications for this visit.     Objective: BP 104/80 (BP Location: Left Arm, Patient Position: Sitting, Cuff Size: Large)   Pulse 88   Temp 98.5 F (36.9 C) (Oral)   Ht 6' 1.5" (1.867 m)   Wt 212 lb 6.4 oz (96.3 kg)   SpO2 97%   BMI 27.64 kg/m  Gen: NAD, resting comfortably CV: RRR no murmurs rubs or gallops Lungs: CTAB no crackles, wheeze, rhonchi Ext: no edema Skin: warm, dry Neuro: good mentation, seems to have some mild paranoia about his wife being seen in separate room  Assessment/Plan:  HTN (hypertension) S: controlled on amlodipine 5mg  BP Readings from Last 3 Encounters:  06/02/17 104/80  03/26/17 102/78  02/19/17 (!) 134/92  A/P: We discussed blood pressure goal of <140/90. Continue current meds:  But reduce to 2.5mg  amlodipine  Hyperlipidemia S: Patient had been on atorvastatin 10mg  but he has stopped this. Last LDL was 63 A/P: encouraged restart of atorvastatin 10mg - with goal bad cholesterol/LDL of 70 or less. Sent back in  Gout S: no flare ups of gout on allopurinol 300mg .  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 Brett Fairy.  A/P: patient plans to continue- I discussed with him using on as needed basis instead of scheduled as he has been using (just started within last few weeks) and following up with Dr. Brett Fairy   Future Appointments Date Time Provider [place] Part  06/28/2017 1:00 PM Kandis Nab, PsyD [facility] None  06/28/2017 2:00 PM [facility] TECH [facility] None  09/09/2017 2:30 PM [name], Asencion Partridge, MD [facility] None  09/13/2017 10:00 AM Kandis Nab,  PsyD [facility] None  10/07/2017 9:30 AM Marin Olp, MD [facility] None   Return in about 4 months (around 10/03/2017) for physical.  Orders Placed This Encounter  Procedures  . Flu vaccine HIGH DOSE PF    Meds ordered this encounter  Medications  . atorvastatin (LIPITOR) 10 MG tablet    Sig: Take 1 tablet (10 mg total) by mouth daily.    Dispense:  90 tablet    Refill:  3  . amLODipine (NORVASC) 2.5 MG tablet    Sig: Take 1 tablet (2.5 mg total) by mouth daily.    Dispense:  90 tablet    Refill:  3    Return precautions advised.  [name], MD

## 2017-06-07 DIAGNOSIS — R972 Elevated prostate specific antigen [PSA]: Secondary | ICD-10-CM | POA: Diagnosis not present

## 2017-06-07 LAB — PSA: PSA: 3.48

## 2017-06-07 NOTE — Addendum Note (Signed)
Addended by: Larey Seat on: 06/07/2017 06:43 PM   Modules accepted: Orders

## 2017-06-07 NOTE — Telephone Encounter (Addendum)
neuropsychology test to be done ( repeated ) with DR. Bonita Quin.  Order placed. 2 hour EEG - order placed.  Patient not to drive- please involve primary care physician. . Patient is not communicating on my chart.
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
## 2017-06-07 NOTE — Telephone Encounter (Signed)
I am having a difficult time telling the extent of his difficulties honestly between what wife reports and what he says. I saw him recently along with wife and the reports below seem out of proportion to what was reported at that time- though she clearly is very frustrated with the relationship. Thankful for repeat neuropsych testing.

## 2017-06-08 NOTE — Telephone Encounter (Signed)
We need to objectify the complaints of confusion and depression . This cannot be done in our office , needs neuropsychologist's  help. Please let Johnathan Sosa know.

## 2017-06-08 NOTE — Telephone Encounter (Signed)
I called and spoke with patient's wife and he has an apt with Dr Si Raider for Oct 29th. I have informed her that we really need him to not drive and keep this apt. Pt's wife stated that he is not going to like hearing he cant drive and will be upset with her. She verbalized understanding and stated they will keep upcoming apt with Dr Si Raider

## 2017-06-09 DIAGNOSIS — N402 Nodular prostate without lower urinary tract symptoms: Secondary | ICD-10-CM | POA: Diagnosis not present

## 2017-06-09 DIAGNOSIS — R972 Elevated prostate specific antigen [PSA]: Secondary | ICD-10-CM | POA: Diagnosis not present

## 2017-06-14 ENCOUNTER — Encounter: Payer: Self-pay | Admitting: Family Medicine

## 2017-06-14 LAB — TESTOSTERONE, TOTAL, LC/MS: Testosterone: 4.66

## 2017-06-21 ENCOUNTER — Other Ambulatory Visit: Payer: Self-pay | Admitting: *Deleted

## 2017-06-21 DIAGNOSIS — I6523 Occlusion and stenosis of bilateral carotid arteries: Secondary | ICD-10-CM

## 2017-06-28 ENCOUNTER — Encounter: Payer: Medicare HMO | Admitting: Psychology

## 2017-06-28 ENCOUNTER — Encounter: Payer: Self-pay | Admitting: Psychology

## 2017-06-28 ENCOUNTER — Telehealth: Payer: Self-pay | Admitting: Psychology

## 2017-06-28 NOTE — Telephone Encounter (Signed)
Patient's wife is calling. Patient had an appointment with Dr. Si Raider today but they thought this doctor was in our office and came here. Our office told them to go to [address] where they went but that was the wrong office. She called Dr. Marcia Brash office and was told to go to [facility] which was close. When they got there they were told he had to reschedule. Appointment was made for 09-30-16 and to be reviewed on 11-04-16. The patient's wife and patient are upset and not at all happy and do not know what to do because of patient's condition with dementia.

## 2017-06-28 NOTE — Progress Notes (Signed)
Patient was scheduled for neuropsychological evaluation (interview followed by testing) today at 1 pm. Patient's wife called our office at 1:17 pm stating they had gone to the wrong office and were now on their way to our office (and thus would arrive around 1:25-1:30 to our office). Patient's wife was advised that we would need to reschedule the appointment, given that they would be arriving >15 minutes late. Patient's wife became very irritable and upset, using inappropriate language, stating that they were given wrong information about where to go (although front desk confirmed detailed written information and driving directions/map were mailed to them). She stated they are "dealing with Alzheimer's and dementia" and needed to be seen right away. It was explained that we need a full hour to complete the interview, and the patient would not be arriving until 1:30, leaving only 30 mins for the interview (before my next patient). Patient's wife requested patient do the testing today without the interview, and she was advised that we must complete the interview first in order to determine what tests are appropriate for the patient. Patient's wife remained very unhappy and initially stated they would find another provider to do the testing (which they are certainly welcome to do so if they choose). However, she did end up scheduling the patient for the next available appointment with me which is in January.

## 2017-06-29 NOTE — Telephone Encounter (Signed)
Dr Bonita Quin is in with [facility] and they should have gotten a letter with the appoinment from her office.  I am not sure why anyone told her to come here, but certainly sending her to [address] wasn't helpful. I cannot change this appointment or the fact that they have to reschedule.   I am sorry. CD

## 2017-06-30 ENCOUNTER — Telehealth: Payer: Self-pay | Admitting: Neurology

## 2017-06-30 ENCOUNTER — Other Ambulatory Visit: Payer: Self-pay | Admitting: Neurology

## 2017-06-30 DIAGNOSIS — F0391 Unspecified dementia with behavioral disturbance: Secondary | ICD-10-CM

## 2017-06-30 NOTE — Telephone Encounter (Signed)
Called the patient's wife back. She states that they were unable to follow thru with the apt with Dr Richrd Sox. Dr [name] has suggested seeing if he can be seen sooner with Dr Beverly Gust. Referral order placed. We have still insisted the patient not drive.

## 2017-06-30 NOTE — Telephone Encounter (Signed)
Patient's wife is calling to discuss the patient not driving anymore. His Dementia has gotten much worse.

## 2017-07-01 NOTE — Progress Notes (Signed)
Sent notes to Dr. Marlane Hatcher office to see if she can see Patient. Telephone [phone] - fax [phone] . I will call and tell patient details. Thanks Hinton Dyer

## 2017-07-07 ENCOUNTER — Telehealth: Payer: Self-pay | Admitting: Neurology

## 2017-07-07 DIAGNOSIS — R972 Elevated prostate specific antigen [PSA]: Secondary | ICD-10-CM | POA: Diagnosis not present

## 2017-07-07 DIAGNOSIS — C61 Malignant neoplasm of prostate: Secondary | ICD-10-CM | POA: Diagnosis not present

## 2017-07-07 DIAGNOSIS — D075 Carcinoma in situ of prostate: Secondary | ICD-10-CM | POA: Diagnosis not present

## 2017-07-07 NOTE — Telephone Encounter (Signed)
Pt wife has returned the call to speak with Hinton Dyer.  Please return the call to wife of pt

## 2017-07-07 NOTE — Telephone Encounter (Signed)
I called and spoke to patient's wife and had a very long conversation with her and relayed.  Dr. Beverly Gust office called back and she could not take insurance at this time.   I called [facility]  Dr. Richrd Sox   And asked could patient be rescheduled [place] [facility] relayed yes.  Patient needs to keep his follow up apt they requested.   Patient's wife has the telephone and she is going to call them and reschedule neuropsychological evaluation at  [place], [place], [place] [address] [phone].   I relayed to patient's wife to please try and keep this apt.

## 2017-07-12 ENCOUNTER — Other Ambulatory Visit: Payer: Self-pay | Admitting: Urology

## 2017-07-12 DIAGNOSIS — C61 Malignant neoplasm of prostate: Secondary | ICD-10-CM

## 2017-07-13 ENCOUNTER — Telehealth: Payer: Self-pay | Admitting: Neurology

## 2017-07-13 ENCOUNTER — Encounter: Payer: Self-pay | Admitting: Family Medicine

## 2017-07-13 DIAGNOSIS — Z8546 Personal history of malignant neoplasm of prostate: Secondary | ICD-10-CM | POA: Insufficient documentation

## 2017-07-13 DIAGNOSIS — C61 Malignant neoplasm of prostate: Secondary | ICD-10-CM | POA: Insufficient documentation

## 2017-07-13 NOTE — Telephone Encounter (Signed)
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 This Friday he has a CT scan and blood work to see if it has spread and next Monday will have a bone scan. She just wanted Dr. Brett Fairy to know. A returned call is not needed unless there are questions.

## 2017-07-16 DIAGNOSIS — C61 Malignant neoplasm of prostate: Secondary | ICD-10-CM | POA: Diagnosis not present

## 2017-07-19 ENCOUNTER — Encounter (HOSPITAL_COMMUNITY)
Admission: RE | Admit: 2017-07-19 | Discharge: 2017-07-19 | Disposition: A | Discharge status: Home / Self Care | Payer: Medicare HMO | Source: Ambulatory Visit | Attending: Urology | Admitting: Urology

## 2017-07-19 DIAGNOSIS — C61 Malignant neoplasm of prostate: Secondary | ICD-10-CM | POA: Insufficient documentation

## 2017-07-19 MED ORDER — TECHNETIUM TC 99M MEDRONATE IV KIT
21.4000 | PACK | Freq: Once | INTRAVENOUS | Status: AC | PRN
  Administered 2017-07-19: 21.4 via INTRAVENOUS

## 2017-07-20 ENCOUNTER — Telehealth: Payer: Self-pay | Admitting: Neurology
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
## 2017-07-20 NOTE — Telephone Encounter (Signed)
I called the patient's wife back and made her aware that I got her message and that Dr Brett Fairy wants to continue with her recommendation with following thru with the neuro psych doc. She also recommended that the patient see his PCP to help with depression and anger issues. Pt wife verbalized understanding

## 2017-08-02 ENCOUNTER — Ambulatory Visit: Payer: Medicare HMO | Admitting: Neurology

## 2017-08-05 ENCOUNTER — Encounter: Payer: Self-pay | Admitting: Medical Oncology

## 2017-08-05 ENCOUNTER — Telehealth: Payer: Self-pay | Admitting: Medical Oncology

## 2017-08-05 NOTE — Telephone Encounter (Signed)
I called pt to introduce myself as the Prostate Nurse Navigator and the Coordinator of the Prostate [place].  1. I confirmed with the patient he is aware of his referral to the clinic 08/13/17 arriving at 8 am.  2. I discussed the format of the clinic and the physicians he will be seeing that day.  3. I discussed where the clinic is located and how to contact me.  4. I confirmed his address and informed him I would be mailing a packet of information and forms to be completed. I asked him to bring them with him the day of his appointment.   He voiced understanding of the above. I asked him to call me if he has any questions or concerns regarding his appointments or the forms he needs to complete.

## 2017-08-05 NOTE — Progress Notes (Signed)
Requested prostate pathology slides from [facility].

## 2017-08-11 ENCOUNTER — Encounter: Payer: Self-pay | Admitting: Radiation Oncology

## 2017-08-11 NOTE — Progress Notes (Signed)
GU Location of Tumor / Histology: prostatic adenocarcinoma  If Prostate Cancer, Gleason Score is (4 + 4) and PSA is (3.48) on 06/07/2017. Prostate volume: 25 grams.  02/24/2017 PSA  2.26   PSA  1.8 3 yr earlier PSA  0.64  Johnathan Sosa was referred by his PCP, Dr. [name] III to Dr. Jeffie Pollock in May 2018.  Biopsies of prostate (if applicable) revealed:    Past/Anticipated interventions by urology, if any: biopsy, referral to [facility]  Past/Anticipated interventions by medical oncology, if any: no  Weight changes, if any: no  Bowel/Bladder complaints, if any: erectile dysfunction   Nausea/Vomiting, if any: no  Pain issues, if any:  no  SAFETY ISSUES:  Prior radiation? no  Pacemaker/ICD? no  Possible current pregnancy? no  Is the patient on methotrexate? no  Current Complaints / other details:  71 year old male. Married with 1 son and 1 daughter. Mother and Father both with hx of cancer. Patient reports vertigo and dizziness associated with head injury. BP elevated. Patient reports he is very anxious. Patient denies chest pain or headache.

## 2017-08-12 ENCOUNTER — Telehealth: Payer: Self-pay | Admitting: Medical Oncology

## 2017-08-12 NOTE — Telephone Encounter (Signed)
I left appointment reminder for [facility] 08/13/17 arriving at 8:00am. I asked him to call with questions or concerns.

## 2017-08-13 ENCOUNTER — Encounter: Payer: Self-pay | Admitting: Medical Oncology

## 2017-08-13 ENCOUNTER — Ambulatory Visit (HOSPITAL_BASED_OUTPATIENT_CLINIC_OR_DEPARTMENT_OTHER): Payer: Medicare HMO | Admitting: Oncology

## 2017-08-13 ENCOUNTER — Encounter: Payer: Self-pay | Admitting: Radiation Oncology

## 2017-08-13 ENCOUNTER — Ambulatory Visit
Admission: RE | Admit: 2017-08-13 | Discharge: 2017-08-13 | Disposition: A | Discharge status: Home / Self Care | Payer: Medicare HMO | Source: Ambulatory Visit | Attending: Radiation Oncology | Admitting: Radiation Oncology

## 2017-08-13 ENCOUNTER — Encounter: Payer: Self-pay | Admitting: General Practice

## 2017-08-13 ENCOUNTER — Other Ambulatory Visit: Payer: Self-pay | Admitting: Urology

## 2017-08-13 VITALS — BP 131/91 | HR 96 | Resp 18 | Ht 73.5 in | Wt 212.8 lb

## 2017-08-13 DIAGNOSIS — G4700 Insomnia, unspecified: Secondary | ICD-10-CM | POA: Insufficient documentation

## 2017-08-13 DIAGNOSIS — Z8042 Family history of malignant neoplasm of prostate: Secondary | ICD-10-CM | POA: Diagnosis not present

## 2017-08-13 DIAGNOSIS — E785 Hyperlipidemia, unspecified: Secondary | ICD-10-CM | POA: Insufficient documentation

## 2017-08-13 DIAGNOSIS — K219 Gastro-esophageal reflux disease without esophagitis: Secondary | ICD-10-CM | POA: Insufficient documentation

## 2017-08-13 DIAGNOSIS — M109 Gout, unspecified: Secondary | ICD-10-CM | POA: Diagnosis not present

## 2017-08-13 DIAGNOSIS — Z79899 Other long term (current) drug therapy: Secondary | ICD-10-CM | POA: Insufficient documentation

## 2017-08-13 DIAGNOSIS — C61 Malignant neoplasm of prostate: Secondary | ICD-10-CM | POA: Insufficient documentation

## 2017-08-13 DIAGNOSIS — R972 Elevated prostate specific antigen [PSA]: Secondary | ICD-10-CM | POA: Diagnosis not present

## 2017-08-13 DIAGNOSIS — F329 Major depressive disorder, single episode, unspecified: Secondary | ICD-10-CM | POA: Diagnosis not present

## 2017-08-13 DIAGNOSIS — I10 Essential (primary) hypertension: Secondary | ICD-10-CM | POA: Diagnosis not present

## 2017-08-13 DIAGNOSIS — Z87891 Personal history of nicotine dependence: Secondary | ICD-10-CM | POA: Insufficient documentation

## 2017-08-13 HISTORY — DX: Malignant neoplasm of prostate: C61
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
## 2017-08-13 NOTE — Progress Notes (Signed)
                               Care Plan Summary  Name: Johnathan Sosa DOB: 1946/06/28   Your Medical Team:   Urologist -  Dr. Raynelle Bring, [facility]  Radiation Oncologist - Dr. Tyler Pita, [facility]   Medical Oncologist - Dr. Zola Button, [place]  Recommendations: 1) Robotic Prostatectomy 2) Radiation   * These recommendations are based on information available as of today's consult.      Recommendations may change depending on the results of further tests or exams.  Next Steps: 1) Dr. Lynne Logan office will schedule surgery    When appointments need to be scheduled, you will be contacted by [facility] and/or [facility].  Questions?  Please do not hesitate to call Cira Rue, RN, BSN, OCN at [phone] any questions or concerns.  Shirlean Mylar is your Oncology Nurse Navigator and is available to assist you while you're receiving your medical care at [facility].

## 2017-08-13 NOTE — Progress Notes (Signed)
Radiation Oncology         [phone] ________________________________  Multidisciplinary Prostate Cancer Clinic  Initial Radiation Oncology Consultation  Name: Johnathan Sosa MRN: [id number]  Date: 08/13/2017  DOB: February 17, 1946  WU:[id number], Brayton Mars, MD  Raynelle Bring, MD   REFERRING PHYSICIAN: Raynelle Bring, MD  DIAGNOSIS: 71 y.o. gentleman with clinical stage T3a adenocarcinoma of the prostate with a Gleason's score of 4+4 and a PSA of 3.48    ICD-10-CM   1. Prostate cancer ([place]) [place] ILLNESS::Johnathan Sosa is a 71 y.o. gentleman.  He was noted to have an elevated PSA of 1.8 (as compared to PSA of 0.64 in 2015) by his primary care physician, Dr. [name].  Accordingly, he was referred for evaluation in urology by Dr. Jeffie Pollock on 01/27/17,  digital rectal examination was performed at that time revealing symmetrical prostate lobes without nodularity.  A repeat PSA on 02/24/17 revealed further elevation at 2.26 but his DRE remained benign so the decision was to proceed with close monitoring of the PSA.  Repeat PSA 06/07/17 was 3.48 and DRE demonstrated an 38mm right apical nodule. The patient proceeded to transrectal ultrasound with 12 biopsies of the prostate on 07/07/17.  The prostate volume measured 25 cc.  Out of 12 core biopsies, 3 were positive.  The maximum Gleason score was 4+4, and this was seen in the right apex, right apex lateral and right mid lateral.  PSA 10/16/16 - 1.8 02/23/17  - 2.26 06/17/17 - 3.48  The patient underwent disease staging with a CT pelvis performed on 07/16/2017 that was negative for evidence of lymphadenopathy or metastatic disease in the pelvis. A bone scan was performed on 07/19/2017 and was negative for any evidence of osseous metastatic disease.  The patient reviewed the biopsy results with his urologist and he has kindly been referred today to the multidisciplinary prostate cancer clinic for presentation of pathology and radiology studies  in our conference for discussion of potential radiation treatment options and clinical evaluation.  PREVIOUS RADIATION THERAPY: No  PAST MEDICAL HISTORY:  has a past medical history of ADD (attention deficit disorder), Arthritis, Chronic anxiety, Closed TBI (traumatic brain injury) ([place]) (10/24/2014), Depression, GERD (gastroesophageal reflux disease), Gout, HTN (hypertension) (04/29/2012), Hyperlipidemia, Insomnia, Memory loss, Migraine headache, and Prostate cancer ([place]).    PAST SURGICAL HISTORY: Past Surgical History:  Procedure Laterality Date  . ABDOMINAL HERNIA REPAIR  1986  . COLONOSCOPY  July 2013   Normal - Dr. Fuller Plan ([facility])  . INGUINAL HERNIA REPAIR  2002   left  . LAPAROSCOPIC APPENDECTOMY  04/28/2012   Procedure: APPENDECTOMY LAPAROSCOPIC;  Surgeon: Stark Klein, MD;  Location: [facility];  Service: General;  Laterality: N/A;  . PROSTATE BIOPSY      FAMILY HISTORY: family history includes Cancer in his mother; Colon cancer (age of onset: 85) in his father; Heart failure (age of onset: 43) in his father; Hypertension in his mother; Prostate cancer in his father.  SOCIAL HISTORY:  reports that he quit smoking about 36 years ago. His smoking use included cigarettes. He has a 57.50 pack-year smoking history. he has never used smokeless tobacco. He reports that he drinks alcohol. He reports that he does not use drugs.  ALLERGIES: Black pepper [piper]; Ritalin [methylphenidate hcl]; Sulfa antibiotics; and Sulfa antibiotics  MEDICATIONS:  Current Outpatient Medications  Medication Sig Dispense Refill  . allopurinol (ZYLOPRIM) 300 MG tablet Take 300 mg by mouth daily.    [name] Kitchen ALPRAZolam Duanne Moron)  0.25 MG tablet Prn use for VERTIGO po, do not exceed 2 a day. 90 tablet 0  . amLODipine (NORVASC) 2.5 MG tablet Take 1 tablet (2.5 mg total) by mouth daily. 90 tablet 3  . atorvastatin (LIPITOR) 10 MG tablet Take 1 tablet (10 mg total) by mouth daily. 90 tablet 3  . clonazePAM (KLONOPIN) 1 MG  tablet Take 1 tablet (1 mg total) by mouth at bedtime. Take 1 tablet at bedtime PRN for sleep 90 tablet 1  . Vitamin D, Ergocalciferol, (DRISDOL) 50000 units CAPS capsule Take 1 capsule (50,000 Units total) by mouth every 7 (seven) days. (Patient not taking: Reported on 06/02/2017) 12 capsule 0   No current facility-administered medications for this encounter.     REVIEW OF SYSTEMS:  On review of systems, the patient reports that he is doing well overall. He denies any chest pain, shortness of breath, cough, fevers, chills, night sweats, unintended weight changes. He denies any bowel disturbances, and denies abdominal pain, nausea or vomiting. He denies any new musculoskeletal or joint aches or pains. His IPSS was 4, indicating mild urinary symptoms. He has documented ED but uses Viagra with success. A complete review of systems is obtained and is otherwise negative.   PHYSICAL EXAM:  Wt Readings from Last 3 Encounters:  08/13/17 212 lb 12.8 oz (96.5 kg)  06/02/17 212 lb 6.4 oz (96.3 kg)  03/26/17 216 lb 3.2 oz (98.1 kg)   Temp Readings from Last 3 Encounters:  06/02/17 98.5 F (36.9 C) (Oral)  03/26/17 98.4 F (36.9 C) (Oral)  02/19/17 98 F (36.7 C) (Oral)   BP Readings from Last 3 Encounters:  08/13/17 (!) 131/91  06/02/17 104/80  03/26/17 102/78   Pulse Readings from Last 3 Encounters:  08/13/17 96  06/02/17 88  03/26/17 92   Pain Assessment Pain Score: 0-No pain/10   In general this is a well appearing caucasian gentleman in no acute distress. He is alert and oriented x4 and appropriate throughout the examination. HEENT reveals that the patient is normocephalic, atraumatic. EOMs are intact. PERRLA. Skin is intact without any evidence of gross lesions. Cardiovascular exam reveals a regular rate and rhythm, no clicks rubs or murmurs are auscultated. Chest is clear to auscultation bilaterally. Lymphatic assessment is performed and does not reveal any adenopathy in the cervical,  supraclavicular, axillary, or inguinal chains. Abdomen has active bowel sounds in all quadrants and is intact. The abdomen is soft, non tender, non distended. Lower extremities are negative for pretibial pitting edema, deep calf tenderness, cyanosis or clubbing.  KPS = 90  100 - Normal; no complaints; no evidence of disease. 90   - Able to carry on normal activity; minor signs or symptoms of disease. 80   - Normal activity with effort; some signs or symptoms of disease. 66   - Cares for self; unable to carry on normal activity or to do active work. 60   - Requires occasional assistance, but is able to care for most of his personal needs. 50   - Requires considerable assistance and frequent medical care. 90   - Disabled; requires special care and assistance. 64   - Severely disabled; hospital admission is indicated although death not imminent. 64   - Very sick; hospital admission necessary; active supportive treatment necessary. 10   - Moribund; fatal processes progressing rapidly. 0     - Dead  Karnofsky DA, Abelmann WH, Craver LS and Burchenal [facility] [phone]) The use of the nitrogen mustards in  the palliative treatment of carcinoma: with particular reference to bronchogenic carcinoma Cancer 1 634-56   LABORATORY DATA:  Lab Results  Component Value Date   WBC 9.3 10/16/2016   HGB 15.9 10/16/2016   HCT 44.8 10/16/2016   MCV 86.7 10/16/2016   PLT 230 10/16/2016   Lab Results  Component Value Date   NA 140 10/16/2016   K 4.1 10/16/2016   CL 106 10/16/2016   CO2 22 10/16/2016   Lab Results  Component Value Date   ALT 20 10/16/2016   AST 15 10/16/2016   ALKPHOS 53 10/16/2016   BILITOT 0.6 10/16/2016     RADIOGRAPHY: Nm Bone Scan Whole Body  Result Date: 07/19/2017 CLINICAL DATA:  Prostate cancer, PSA = 3.48 on 06/07/2017 EXAM: NUCLEAR MEDICINE WHOLE BODY BONE SCAN TECHNIQUE: Whole body anterior and posterior images were obtained approximately 3 hours after intravenous injection of  radiopharmaceutical. RADIOPHARMACEUTICALS:  21.4 mCi Technetium-93m MDP IV COMPARISON:  None Radiographic correlation: CT pelvis 07/16/2017 FINDINGS: Uptake at the shoulders, sternoclavicular joints, hips, knees, and wrists, typically degenerative. Minimal uptake in the lumbar spine at L4-L5 corresponding to degenerative changes on CT. No definite sites of abnormal osseous tracer accumulation are identified which are suspicious for metastatic disease. Expected urinary tract and soft tissue distribution of tracer. IMPRESSION: Scattered degenerative type uptake as above. No definite scintigraphic evidence of osseous metastatic disease. Electronically Signed   By: Lavonia Dana M.D.   On: 07/19/2017 14:32      IMPRESSION/PLAN: 71 y.o. gentleman with a high risk, stage T3a adenocarcinoma of the prostate with a PSA of 3.48 and a Gleason score of 4+4.  We discussed the patient's workup and outlines the nature of prostate cancer in this setting. The patient's T stage, Gleason's score, and PSA put him into the high risk group. Accordingly, he is eligible for a variety of potential treatment options including LT-ADT in combination with either brachytherapy, 8 weeks of external radiation or 5 weeks of external radiation followed by a brachytherapy boost. We discussed the available radiation techniques, and focused on the details and logistics and delivery. We discussed and outlined the risks, benefits, short and long-term effects associated with radiotherapy and compared and contrasted these with prostatectomy. We also detailed the role of ADT in the treatment of high risk prostate cancer and outlined the associated side effects that could be expected with this therapy.  At the end of the conversation the patient is interested in moving forward with prostectomy. We discussed the potential indications for and role for post-prostatectomy adjuvant or salvage radiotherapy based on final pathology and post-treatment PSA  readings. He seems to have a good understanding of his disease and treatment options. We will share our discussion with Dr. Alinda Money so that he may move forward with scheduling prostatectomy in the near future.  We enjoyed meeting him and his wife today and are happy to continue to participate in the care of this nice gentleman in the future if clinically indicated.  We spent 40 minutes minutes face to face with the patient and more than 50% of that time was spent in counseling and/or coordination of care.    Nicholos Johns, PA-C    Tyler Pita, MD  [facility] Direct Dial: [phone]  Fax: [phone] [email or web address]  Skype  [email or web address]    Page Me     This document serves as a record of services personally performed by Tyler Pita, MD and Ashlyn Bruning PA-C. It was created on  their behalf by Delton Coombes, a trained medical scribe. The creation of this record is based on the scribe's personal observations and the provider's statements to them.

## 2017-08-13 NOTE — Progress Notes (Signed)
[facility] Psychosocial Distress Screening Spiritual Care  Met with Krikor and his wife [name] in [facility] to introduce [facility] team/resources, reviewing distress screen per protocol.  The patient scored a 8 on the Psychosocial Distress Thermometer which indicates severe distress. Also assessed for distress and other psychosocial needs.   ONCBCN DISTRESS SCREENING 08/13/2017  Screening Type Initial Screening  Distress experienced in past week (1-10) 8  Practical problem type Work/school;Food  Emotional problem type Depression;Nervousness/Anxiety;Adjusting to illness  Spiritual/Religous concerns type Facing my mortality  Information Concerns Type Lack of info about maintaining fitness  Physical Problem type Sexual problems  Referral to support programs Yes   Zenia Resides and [name] have known each other 77 years and been married almost 2; [name] notes the layers of grief involved in having had so many hopes and plans for their life together, and then discovering Zyere's frontal-lobe dementia and prostate ca dx. Per pt, dealing with this combination plus work is very stressful. They report good support from her family and his son, as well as strength and comfort from prayer and God's provision in their lives.   Follow up needed: Yes.  [name] in particular welcomes f/u calls for support, and I plan to send them a handwritten note of encouragement as well. Spiritual Care may be a resource to help her cope with distress and maximize meaning-making as she navigates her role as a caregiver.    [place], [place], [facility] Pager [phone] Voicemail [phone]
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
## 2017-08-16 ENCOUNTER — Encounter: Payer: Self-pay | Admitting: *Deleted

## 2017-08-16 ENCOUNTER — Encounter: Payer: Self-pay | Admitting: General Practice

## 2017-08-16 NOTE — Progress Notes (Signed)
[facility] Spiritual Care Note  Sent handwritten note of encouragement to Johnathan Sosa and his wife Johnathan Sosa. Plan to f/u with Johnathan Sosa by phone later this week per her request.   Suzette Battiest, [place], [facility] Pager [phone] Voicemail [phone]

## 2017-08-17 ENCOUNTER — Encounter: Payer: Self-pay | Admitting: Family Medicine

## 2017-08-19 ENCOUNTER — Telehealth: Payer: Self-pay | Admitting: Medical Oncology

## 2017-08-19 NOTE — Telephone Encounter (Signed)
Left message to follow up post Prostate MDC. I asked them to call me if I can answer any questions or concerns. He is scheduled for prostatectomy 09/02/16.

## 2017-08-20 ENCOUNTER — Other Ambulatory Visit: Payer: Self-pay

## 2017-08-20 DIAGNOSIS — S069X9A Unspecified intracranial injury with loss of consciousness of unspecified duration, initial encounter: Principal | ICD-10-CM

## 2017-08-20 DIAGNOSIS — R42 Dizziness and giddiness: Secondary | ICD-10-CM

## 2017-08-20 MED ORDER — ALPRAZOLAM 0.25 MG PO TABS: ORAL_TABLET | ORAL | 0 refills | Status: DC

## 2017-08-26 DIAGNOSIS — C61 Malignant neoplasm of prostate: Secondary | ICD-10-CM | POA: Diagnosis not present

## 2017-08-26 DIAGNOSIS — M6281 Muscle weakness (generalized): Secondary | ICD-10-CM | POA: Diagnosis not present

## 2017-08-26 DIAGNOSIS — R972 Elevated prostate specific antigen [PSA]: Secondary | ICD-10-CM | POA: Diagnosis not present

## 2017-08-26 DIAGNOSIS — M62838 Other muscle spasm: Secondary | ICD-10-CM | POA: Diagnosis not present

## 2017-08-26 NOTE — Patient Instructions (Signed)
Johnathan Sosa  08/26/2017   Your procedure is scheduled on: 09-02-17  Report to [facility] Main  Entrance Take [place]  elevators to 3rd floor to  [place] at [facility].   Call this number if you have problems the morning of surgery [phone]   Remember: ONLY 1 PERSON MAY GO WITH YOU TO SHORT STAY TO GET  READY MORNING OF [place].  Do not eat food or drink liquids :After Midnight.     Take these medicines the morning of surgery with A SIP OF WATER: AMLODIPINE, ALLOPURINOL, WELLBUTRIN, TYLENOL IF NEEDED                                You may not have any metal on your body including hair pins and              piercings  Do not wear jewelry, make-up, lotions, powders or perfumes, deodorant                        Men may shave face and neck.   Do not bring valuables to the hospital. [place].  Contacts, dentures or bridgework may not be worn into surgery.  Leave suitcase in the car. After surgery it may be brought to your room.                 Please read over the following fact sheets you were given: _____________________________________________________________________             [facility] - Preparing for Surgery Before surgery, you can play an important role.  Because skin is not sterile, your skin needs to be as free of germs as possible.  You can reduce the number of germs on your skin by washing with CHG (chlorahexidine gluconate) soap before surgery.  CHG is an antiseptic cleaner which kills germs and bonds with the skin to continue killing germs even after washing. Please DO NOT use if you have an allergy to CHG or antibacterial soaps.  If your skin becomes reddened/irritated stop using the CHG and inform your nurse when you arrive at Short Stay. Do not shave (including legs and underarms) for at least 48 hours prior to the first CHG shower.  You may shave your face/neck. Please follow these  instructions carefully:  1.  Shower with CHG Soap the night before surgery and the  morning of Surgery.  2.  If you choose to wash your hair, wash your hair first as usual with your  normal  shampoo.  3.  After you shampoo, rinse your hair and body thoroughly to remove the  shampoo.                           4.  Use CHG as you would any other liquid soap.  You can apply chg directly  to the skin and wash                       Gently with a scrungie or clean washcloth.  5.  Apply the CHG Soap to your body ONLY FROM THE NECK DOWN.   Do not use on  face/ open                           Wound or open sores. Avoid contact with eyes, ears mouth and genitals (private parts).                       Wash face,  Genitals (private parts) with your normal soap.             6.  Wash thoroughly, paying special attention to the area where your surgery  will be performed.  7.  Thoroughly rinse your body with warm water from the neck down.  8.  DO NOT shower/wash with your normal soap after using and rinsing off  the CHG Soap.                9.  Pat yourself dry with a clean towel.            10.  Wear clean pajamas.            11.  Place clean sheets on your bed the night of your first shower and do not  sleep with pets. Day of Surgery : Do not apply any lotions/deodorants the morning of surgery.  Please wear clean clothes to the hospital/surgery center.  FAILURE TO FOLLOW THESE INSTRUCTIONS MAY RESULT IN THE CANCELLATION OF YOUR SURGERY PATIENT SIGNATURE_________________________________  NURSE SIGNATURE__________________________________  ________________________________________________________________________   Adam Phenix  An incentive spirometer is a tool that can help keep your lungs clear and active. This tool measures how well you are filling your lungs with each breath. Taking long deep breaths may help reverse or decrease the chance of developing breathing (pulmonary) problems (especially  infection) following:  A long period of time when you are unable to move or be active. BEFORE THE PROCEDURE   If the spirometer includes an indicator to show your best effort, your nurse or respiratory therapist will set it to a desired goal.  If possible, sit up straight or lean slightly forward. Try not to slouch.  Hold the incentive spirometer in an upright position. INSTRUCTIONS FOR USE  1. Sit on the edge of your bed if possible, or sit up as far as you can in bed or on a chair. 2. Hold the incentive spirometer in an upright position. 3. Breathe out normally. 4. Place the mouthpiece in your mouth and seal your lips tightly around it. 5. Breathe in slowly and as deeply as possible, raising the piston or the ball toward the top of the column. 6. Hold your breath for 3-5 seconds or for as long as possible. Allow the piston or ball to fall to the bottom of the column. 7. Remove the mouthpiece from your mouth and breathe out normally. 8. Rest for a few seconds and repeat Steps 1 through 7 at least 10 times every 1-2 hours when you are awake. Take your time and take a few normal breaths between deep breaths. 9. The spirometer may include an indicator to show your best effort. Use the indicator as a goal to work toward during each repetition. 10. After each set of 10 deep breaths, practice coughing to be sure your lungs are clear. If you have an incision (the cut made at the time of surgery), support your incision when coughing by placing a pillow or rolled up towels firmly against it. Once you are able to get out of bed, walk around indoors  and cough well. You may stop using the incentive spirometer when instructed by your caregiver.  RISKS AND COMPLICATIONS  Take your time so you do not get dizzy or light-headed.  If you are in pain, you may need to take or ask for pain medication before doing incentive spirometry. It is harder to take a deep breath if you are having pain. AFTER  USE  Rest and breathe slowly and easily.  It can be helpful to keep track of a log of your progress. Your caregiver can provide you with a simple table to help with this. If you are using the spirometer at home, follow these instructions: [place] IF:   You are having difficultly using the spirometer.  You have trouble using the spirometer as often as instructed.  Your pain medication is not giving enough relief while using the spirometer.  You develop fever of 100.5 F (38.1 C) or higher. SEEK IMMEDIATE MEDICAL CARE IF:   You cough up bloody sputum that had not been present before.  You develop fever of 102 F (38.9 C) or greater.  You develop worsening pain at or near the incision site. MAKE SURE YOU:   Understand these instructions.  Will watch your condition.  Will get help right away if you are not doing well or get worse. Document Released: 12/28/2006 Document Revised: 11/09/2011 Document Reviewed: 02/28/2007 ExitCare Patient Information 2014 ExitCare, [place].   ________________________________________________________________________  WHAT IS A BLOOD TRANSFUSION? Blood Transfusion Information  A transfusion is the replacement of blood or some of its parts. Blood is made up of multiple cells which provide different functions.  Red blood cells carry oxygen and are used for blood loss replacement.  White blood cells fight against infection.  Platelets control bleeding.  Plasma helps clot blood.  Other blood products are available for specialized needs, such as hemophilia or other clotting disorders. BEFORE THE TRANSFUSION  Who gives blood for transfusions?   Healthy volunteers who are fully evaluated to make sure their blood is safe. This is blood bank blood. Transfusion therapy is the safest it has ever been in the practice of medicine. Before blood is taken from a donor, a complete history is taken to make sure that person has no history of diseases  nor engages in risky social behavior (examples are intravenous drug use or sexual activity with multiple partners). The donor's travel history is screened to minimize risk of transmitting infections, such as malaria. The donated blood is tested for signs of infectious diseases, such as HIV and hepatitis. The blood is then tested to be sure it is compatible with you in order to minimize the chance of a transfusion reaction. If you or a relative donates blood, this is often done in anticipation of surgery and is not appropriate for emergency situations. It takes many days to process the donated blood. RISKS AND COMPLICATIONS Although transfusion therapy is very safe and saves many lives, the main dangers of transfusion include:   Getting an infectious disease.  Developing a transfusion reaction. This is an allergic reaction to something in the blood you were given. Every precaution is taken to prevent this. The decision to have a blood transfusion has been considered carefully by your caregiver before blood is given. Blood is not given unless the benefits outweigh the risks. AFTER THE TRANSFUSION  Right after receiving a blood transfusion, you will usually feel much better and more energetic. This is especially true if your red blood cells have gotten low (  anemic). The transfusion raises the level of the red blood cells which carry oxygen, and this usually causes an energy increase.  The nurse administering the transfusion will monitor you carefully for complications. HOME CARE INSTRUCTIONS  No special instructions are needed after a transfusion. You may find your energy is better. Speak with your caregiver about any limitations on activity for underlying diseases you may have. SEEK MEDICAL CARE IF:   Your condition is not improving after your transfusion.  You develop redness or irritation at the intravenous (IV) site. SEEK IMMEDIATE MEDICAL CARE IF:  Any of the following symptoms occur over the  next 12 hours:  Shaking chills.  You have a temperature by mouth above 102 F (38.9 C), not controlled by medicine.  Chest, back, or muscle pain.  People around you feel you are not acting correctly or are confused.  Shortness of breath or difficulty breathing.  Dizziness and fainting.  You get a rash or develop hives.  You have a decrease in urine output.  Your urine turns a dark color or changes to pink, red, or brown. Any of the following symptoms occur over the next 10 days:  You have a temperature by mouth above 102 F (38.9 C), not controlled by medicine.  Shortness of breath.  Weakness after normal activity.  The white part of the eye turns yellow (jaundice).  You have a decrease in the amount of urine or are urinating less often.  Your urine turns a dark color or changes to pink, red, or brown. Document Released: 08/14/2000 Document Revised: 11/09/2011 Document Reviewed: 04/02/2008 [facility] Patient Information 2014 [place], [place].  _______________________________________________________________________

## 2017-08-26 NOTE — Progress Notes (Signed)
STRESS TEST 11-18-16 Epic  EKG 10-28-16 Epic  LOV CARDIOLOGY DR Marlou Porch 10-28-16 Epic  ECHO 08-17-16 Epic

## 2017-08-27 ENCOUNTER — Encounter: Payer: Self-pay | Admitting: General Practice

## 2017-08-27 ENCOUNTER — Encounter (HOSPITAL_COMMUNITY): Payer: Self-pay

## 2017-08-27 ENCOUNTER — Encounter (HOSPITAL_COMMUNITY)
Admission: RE | Admit: 2017-08-27 | Discharge: 2017-08-27 | Disposition: A | Discharge status: Home / Self Care | Payer: Medicare HMO | Source: Ambulatory Visit | Attending: Urology | Admitting: Urology

## 2017-08-27 ENCOUNTER — Telehealth: Payer: Self-pay | Admitting: Medical Oncology

## 2017-08-27 ENCOUNTER — Other Ambulatory Visit: Payer: Self-pay

## 2017-08-27 DIAGNOSIS — C61 Malignant neoplasm of prostate: Secondary | ICD-10-CM | POA: Diagnosis not present

## 2017-08-27 DIAGNOSIS — Z01818 Encounter for other preprocedural examination: Secondary | ICD-10-CM | POA: Insufficient documentation

## 2017-08-27 HISTORY — DX: Cardiac arrhythmia, unspecified: I49.9

## 2017-08-27 HISTORY — DX: Transient cerebral ischemic attack, unspecified: G45.9

## 2017-08-27 LAB — CBC
HEMATOCRIT: 43.9 % (ref 39.0–52.0)
HEMOGLOBIN: 15.8 g/dL (ref 13.0–17.0)
MCH: 31.1 pg (ref 26.0–34.0)
MCHC: 36 g/dL (ref 30.0–36.0)
MCV: 86.4 fL (ref 78.0–100.0)
Platelets: 219 10*3/uL (ref 150–400)
RBC: 5.08 MIL/uL (ref 4.22–5.81)
RDW: 13.1 % (ref 11.5–15.5)
WBC: 11 10*3/uL — AB (ref 4.0–10.5)

## 2017-08-27 LAB — BASIC METABOLIC PANEL
ANION GAP: 6 (ref 5–15)
BUN: 16 mg/dL (ref 6–20)
CO2: 25 mmol/L (ref 22–32)
Calcium: 9.3 mg/dL (ref 8.9–10.3)
Chloride: 107 mmol/L (ref 101–111)
Creatinine, Ser: 1.16 mg/dL (ref 0.61–1.24)
GFR calc Af Amer: >60 mL/min (ref >60)
Glucose, Bld: 108 mg/dL — ABNORMAL HIGH (ref 65–99)
POTASSIUM: 4.2 mmol/L (ref 3.5–5.1)
SODIUM: 138 mmol/L (ref 135–145)

## 2017-08-27 NOTE — Progress Notes (Signed)
LOV neuro 01-26-17 epic

## 2017-08-27 NOTE — Progress Notes (Signed)
[facility] Spiritual Care Note  Reached spouse [name] by phone briefly for f/u support. She and Zenia Resides were at preop, which limited call. Their top concern today is how anesthesia may affect pt's dementia. Encouraged them to ask at pre-op appt and will also forward question to prostate navigator Robin Bass/RN and Dr Alen Blew.   [place], [place], [facility] Pager [phone] Voicemail [phone]

## 2017-08-27 NOTE — Progress Notes (Signed)
Patient has [prior documented history of memory impairment and cognitive delays following TBI sustained in 2013. Patient at pre-op appt, alert and oriented x4.
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
## 2017-08-28 LAB — ABO/RH: ABO/RH(D): O POS

## 2017-08-31 DIAGNOSIS — C61 Malignant neoplasm of prostate: Secondary | ICD-10-CM

## 2017-08-31 HISTORY — DX: Malignant neoplasm of prostate: C61

## 2017-08-31 HISTORY — PX: PROSTATE BIOPSY: SHX241
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
## 2017-09-02 ENCOUNTER — Encounter (HOSPITAL_COMMUNITY)
Admission: RE | Disposition: A | Discharge status: Home / Self Care | Payer: Self-pay | Source: Ambulatory Visit | Attending: Urology

## 2017-09-02 ENCOUNTER — Observation Stay (HOSPITAL_COMMUNITY)
Admission: RE | Admit: 2017-09-02 | Discharge: 2017-09-03 | Disposition: A | Discharge status: Home / Self Care | Payer: Medicare HMO | Source: Ambulatory Visit | Attending: Urology | Admitting: Urology

## 2017-09-02 ENCOUNTER — Ambulatory Visit (HOSPITAL_COMMUNITY): Payer: Medicare HMO | Admitting: Anesthesiology

## 2017-09-02 ENCOUNTER — Encounter (HOSPITAL_COMMUNITY): Payer: Self-pay | Admitting: *Deleted

## 2017-09-02 ENCOUNTER — Other Ambulatory Visit: Payer: Self-pay

## 2017-09-02 DIAGNOSIS — E785 Hyperlipidemia, unspecified: Secondary | ICD-10-CM | POA: Insufficient documentation

## 2017-09-02 DIAGNOSIS — Z8546 Personal history of malignant neoplasm of prostate: Secondary | ICD-10-CM | POA: Diagnosis present

## 2017-09-02 DIAGNOSIS — Z79899 Other long term (current) drug therapy: Secondary | ICD-10-CM | POA: Diagnosis not present

## 2017-09-02 DIAGNOSIS — K219 Gastro-esophageal reflux disease without esophagitis: Secondary | ICD-10-CM | POA: Diagnosis not present

## 2017-09-02 DIAGNOSIS — M1990 Unspecified osteoarthritis, unspecified site: Secondary | ICD-10-CM | POA: Insufficient documentation

## 2017-09-02 DIAGNOSIS — C61 Malignant neoplasm of prostate: Secondary | ICD-10-CM | POA: Diagnosis not present

## 2017-09-02 DIAGNOSIS — G4730 Sleep apnea, unspecified: Secondary | ICD-10-CM | POA: Diagnosis not present

## 2017-09-02 DIAGNOSIS — M109 Gout, unspecified: Secondary | ICD-10-CM | POA: Diagnosis not present

## 2017-09-02 DIAGNOSIS — I10 Essential (primary) hypertension: Secondary | ICD-10-CM | POA: Insufficient documentation

## 2017-09-02 DIAGNOSIS — F329 Major depressive disorder, single episode, unspecified: Secondary | ICD-10-CM | POA: Insufficient documentation

## 2017-09-02 DIAGNOSIS — Z87891 Personal history of nicotine dependence: Secondary | ICD-10-CM | POA: Insufficient documentation

## 2017-09-02 DIAGNOSIS — F419 Anxiety disorder, unspecified: Secondary | ICD-10-CM | POA: Diagnosis not present

## 2017-09-02 HISTORY — PX: ROBOT ASSISTED LAPAROSCOPIC RADICAL PROSTATECTOMY: SHX5141

## 2017-09-02 HISTORY — PX: LYMPHADENECTOMY: SHX5960

## 2017-09-02 LAB — TYPE AND SCREEN
ABO/RH(D): O POS
ANTIBODY SCREEN: NEGATIVE

## 2017-09-02 LAB — HEMOGLOBIN AND HEMATOCRIT, BLOOD
HEMATOCRIT: 41.1 % (ref 39.0–52.0)
Hemoglobin: 14.4 g/dL (ref 13.0–17.0)

## 2017-09-02 SURGERY — XI ROBOTIC ASSISTED LAPAROSCOPIC RADICAL PROSTATECTOMY LEVEL 2
Anesthesia: General

## 2017-09-02 MED ORDER — OXYCODONE HCL 5 MG/5ML PO SOLN: 5.0000 mg | Freq: Once | ORAL | Status: DC | PRN

## 2017-09-02 MED ORDER — ACETAMINOPHEN 325 MG PO TABS: 650.0000 mg | ORAL_TABLET | ORAL | Status: DC | PRN

## 2017-09-02 MED ORDER — ROCURONIUM BROMIDE 50 MG/5ML IV SOSY
PREFILLED_SYRINGE | INTRAVENOUS | Status: AC
  Filled 2017-09-02: qty 5

## 2017-09-02 MED ORDER — BUPIVACAINE-EPINEPHRINE 0.25% -1:200000 IJ SOLN
INTRAMUSCULAR | Status: DC | PRN
  Administered 2017-09-02: 27 mL

## 2017-09-02 MED ORDER — PHENYLEPHRINE 40 MCG/ML (10ML) SYRINGE FOR IV PUSH (FOR BLOOD PRESSURE SUPPORT)
PREFILLED_SYRINGE | INTRAVENOUS | Status: DC | PRN
  Administered 2017-09-02 (×4): 80 ug via INTRAVENOUS

## 2017-09-02 MED ORDER — LACTATED RINGERS IV SOLN
INTRAVENOUS | Status: DC | PRN
  Administered 2017-09-02: 1000 mL

## 2017-09-02 MED ORDER — CLONAZEPAM 0.5 MG PO TABS: 0.2500 mg | ORAL_TABLET | Freq: Every evening | ORAL | Status: DC | PRN

## 2017-09-02 MED ORDER — HYDROCODONE-ACETAMINOPHEN 5-325 MG PO TABS: 1.0000 | ORAL_TABLET | Freq: Four times a day (QID) | ORAL | 0 refills | Status: DC | PRN

## 2017-09-02 MED ORDER — ONDANSETRON HCL 4 MG/2ML IJ SOLN
INTRAMUSCULAR | Status: DC | PRN
  Administered 2017-09-02: 4 mg via INTRAVENOUS

## 2017-09-02 MED ORDER — MIDAZOLAM HCL 2 MG/2ML IJ SOLN
INTRAMUSCULAR | Status: DC | PRN
  Administered 2017-09-02: 2 mg via INTRAVENOUS

## 2017-09-02 MED ORDER — HEPARIN SODIUM (PORCINE) 1000 UNIT/ML IJ SOLN
INTRAMUSCULAR | Status: AC
  Filled 2017-09-02: qty 1

## 2017-09-02 MED ORDER — EPHEDRINE SULFATE-NACL 50-0.9 MG/10ML-% IV SOSY
PREFILLED_SYRINGE | INTRAVENOUS | Status: DC | PRN
  Administered 2017-09-02 (×2): 10 mg via INTRAVENOUS
  Administered 2017-09-02 (×2): 5 mg via INTRAVENOUS
  Administered 2017-09-02: 10 mg via INTRAVENOUS

## 2017-09-02 MED ORDER — ONDANSETRON HCL 4 MG/2ML IJ SOLN
INTRAMUSCULAR | Status: AC
  Filled 2017-09-02: qty 2

## 2017-09-02 MED ORDER — OXYCODONE HCL 5 MG PO TABS: 5.0000 mg | ORAL_TABLET | Freq: Once | ORAL | Status: DC | PRN

## 2017-09-02 MED ORDER — CEFAZOLIN SODIUM-DEXTROSE 2-4 GM/100ML-% IV SOLN
2.0000 g | Freq: Once | INTRAVENOUS | Status: AC
  Administered 2017-09-02: 2 g via INTRAVENOUS
  Filled 2017-09-02: qty 100

## 2017-09-02 MED ORDER — PHENYLEPHRINE 40 MCG/ML (10ML) SYRINGE FOR IV PUSH (FOR BLOOD PRESSURE SUPPORT)
PREFILLED_SYRINGE | INTRAVENOUS | Status: AC
  Filled 2017-09-02: qty 10

## 2017-09-02 MED ORDER — KETOROLAC TROMETHAMINE 15 MG/ML IJ SOLN
15.0000 mg | Freq: Four times a day (QID) | INTRAMUSCULAR | Status: DC
  Administered 2017-09-02 – 2017-09-03 (×4): 15 mg via INTRAVENOUS
  Filled 2017-09-02 (×4): qty 1

## 2017-09-02 MED ORDER — ROCURONIUM BROMIDE 10 MG/ML (PF) SYRINGE
PREFILLED_SYRINGE | INTRAVENOUS | Status: DC | PRN
  Administered 2017-09-02: 10 mg via INTRAVENOUS
  Administered 2017-09-02 (×2): 20 mg via INTRAVENOUS
  Administered 2017-09-02: 50 mg via INTRAVENOUS
  Administered 2017-09-02: 20 mg via INTRAVENOUS

## 2017-09-02 MED ORDER — PROPOFOL 10 MG/ML IV BOLUS
INTRAVENOUS | Status: AC
  Filled 2017-09-02: qty 20

## 2017-09-02 MED ORDER — BELLADONNA ALKALOIDS-OPIUM 16.2-60 MG RE SUPP
1.0000 | Freq: Four times a day (QID) | RECTAL | Status: DC | PRN
  Administered 2017-09-02: 1 via RECTAL
  Filled 2017-09-02: qty 1

## 2017-09-02 MED ORDER — VITAMIN B-6 50 MG PO TABS
50.0000 mg | ORAL_TABLET | Freq: Every day | ORAL | Status: DC
  Administered 2017-09-03: 50 mg via ORAL
  Filled 2017-09-02: qty 1

## 2017-09-02 MED ORDER — SUGAMMADEX SODIUM 200 MG/2ML IV SOLN
INTRAVENOUS | Status: AC
  Filled 2017-09-02: qty 2

## 2017-09-02 MED ORDER — DEXAMETHASONE SODIUM PHOSPHATE 10 MG/ML IJ SOLN
INTRAMUSCULAR | Status: DC | PRN
  Administered 2017-09-02: 10 mg via INTRAVENOUS

## 2017-09-02 MED ORDER — SODIUM CHLORIDE 0.9 % IV BOLUS (SEPSIS)
1000.0000 mL | Freq: Once | INTRAVENOUS | Status: AC
  Administered 2017-09-02: 1000 mL via INTRAVENOUS

## 2017-09-02 MED ORDER — DIPHENHYDRAMINE HCL 50 MG/ML IJ SOLN: 12.5000 mg | Freq: Four times a day (QID) | INTRAMUSCULAR | Status: DC | PRN

## 2017-09-02 MED ORDER — SUFENTANIL CITRATE 50 MCG/ML IV SOLN
INTRAVENOUS | Status: AC
Start: 2017-09-02 — End: ?
  Filled 2017-09-02: qty 1

## 2017-09-02 MED ORDER — ALPRAZOLAM 0.25 MG PO TABS: 0.2500 mg | ORAL_TABLET | Freq: Two times a day (BID) | ORAL | Status: DC | PRN

## 2017-09-02 MED ORDER — SUGAMMADEX SODIUM 200 MG/2ML IV SOLN
INTRAVENOUS | Status: DC | PRN
  Administered 2017-09-02: 200 mg via INTRAVENOUS

## 2017-09-02 MED ORDER — BUPIVACAINE-EPINEPHRINE 0.25% -1:200000 IJ SOLN
INTRAMUSCULAR | Status: AC
  Filled 2017-09-02: qty 1

## 2017-09-02 MED ORDER — AMLODIPINE BESYLATE 5 MG PO TABS
2.5000 mg | ORAL_TABLET | Freq: Every day | ORAL | Status: DC
  Administered 2017-09-03: 2.5 mg via ORAL
  Filled 2017-09-02: qty 1

## 2017-09-02 MED ORDER — MIDAZOLAM HCL 2 MG/2ML IJ SOLN
INTRAMUSCULAR | Status: AC
  Filled 2017-09-02: qty 2

## 2017-09-02 MED ORDER — MORPHINE SULFATE (PF) 4 MG/ML IV SOLN: 2.0000 mg | INTRAVENOUS | Status: DC | PRN

## 2017-09-02 MED ORDER — DEXAMETHASONE SODIUM PHOSPHATE 10 MG/ML IJ SOLN
INTRAMUSCULAR | Status: AC
  Filled 2017-09-02: qty 1

## 2017-09-02 MED ORDER — HYDROMORPHONE HCL 1 MG/ML IJ SOLN: 0.2500 mg | INTRAMUSCULAR | Status: DC | PRN

## 2017-09-02 MED ORDER — LIDOCAINE 2% (20 MG/ML) 5 ML SYRINGE
INTRAMUSCULAR | Status: AC
  Filled 2017-09-02: qty 5

## 2017-09-02 MED ORDER — SODIUM CHLORIDE 0.9 % IR SOLN
Status: DC | PRN
  Administered 2017-09-02: 1000 mL via INTRAVESICAL

## 2017-09-02 MED ORDER — BUPROPION HCL ER (XL) 300 MG PO TB24
300.0000 mg | ORAL_TABLET | Freq: Every day | ORAL | Status: DC
  Administered 2017-09-03: 300 mg via ORAL
  Filled 2017-09-02: qty 1

## 2017-09-02 MED ORDER — DIPHENHYDRAMINE HCL 12.5 MG/5ML PO ELIX: 12.5000 mg | ORAL_SOLUTION | Freq: Four times a day (QID) | ORAL | Status: DC | PRN

## 2017-09-02 MED ORDER — SODIUM CHLORIDE 0.9 % IJ SOLN
INTRAMUSCULAR | Status: AC
  Filled 2017-09-02: qty 10

## 2017-09-02 MED ORDER — LACTATED RINGERS IV SOLN
INTRAVENOUS | Status: DC
  Administered 2017-09-02 (×3): via INTRAVENOUS

## 2017-09-02 MED ORDER — CIPROFLOXACIN HCL 500 MG PO TABS: 500.0000 mg | ORAL_TABLET | Freq: Two times a day (BID) | ORAL | 0 refills | Status: DC

## 2017-09-02 MED ORDER — DOCUSATE SODIUM 100 MG PO CAPS
100.0000 mg | ORAL_CAPSULE | Freq: Two times a day (BID) | ORAL | Status: DC
  Administered 2017-09-02 – 2017-09-03 (×2): 100 mg via ORAL
  Filled 2017-09-02 (×2): qty 1

## 2017-09-02 MED ORDER — ALLOPURINOL 300 MG PO TABS
300.0000 mg | ORAL_TABLET | Freq: Every day | ORAL | Status: DC
  Administered 2017-09-02 – 2017-09-03 (×2): 300 mg via ORAL
  Filled 2017-09-02 (×2): qty 1

## 2017-09-02 MED ORDER — LIDOCAINE 2% (20 MG/ML) 5 ML SYRINGE
INTRAMUSCULAR | Status: DC | PRN
  Administered 2017-09-02: 100 mg via INTRAVENOUS

## 2017-09-02 MED ORDER — ATORVASTATIN CALCIUM 10 MG PO TABS
10.0000 mg | ORAL_TABLET | Freq: Every day | ORAL | Status: DC
  Administered 2017-09-03: 10 mg via ORAL
  Filled 2017-09-02: qty 1

## 2017-09-02 MED ORDER — KCL IN DEXTROSE-NACL 20-5-0.45 MEQ/L-%-% IV SOLN
INTRAVENOUS | Status: DC
  Administered 2017-09-02 – 2017-09-03 (×2): via INTRAVENOUS
  Filled 2017-09-02 (×4): qty 1000

## 2017-09-02 MED ORDER — ONDANSETRON HCL 4 MG/2ML IJ SOLN: 4.0000 mg | Freq: Once | INTRAMUSCULAR | Status: DC | PRN

## 2017-09-02 MED ORDER — CEFAZOLIN SODIUM-DEXTROSE 1-4 GM/50ML-% IV SOLN
1.0000 g | Freq: Three times a day (TID) | INTRAVENOUS | Status: AC
  Administered 2017-09-02 – 2017-09-03 (×2): 1 g via INTRAVENOUS
  Filled 2017-09-02 (×2): qty 50

## 2017-09-02 MED ORDER — SUFENTANIL CITRATE 50 MCG/ML IV SOLN
INTRAVENOUS | Status: DC | PRN
  Administered 2017-09-02: 2.5 ug via INTRAVENOUS
  Administered 2017-09-02: 10 ug via INTRAVENOUS
  Administered 2017-09-02: 5 ug via INTRAVENOUS
  Administered 2017-09-02: 2.5 ug via INTRAVENOUS
  Administered 2017-09-02: 10 ug via INTRAVENOUS
  Administered 2017-09-02: 20 ug via INTRAVENOUS

## 2017-09-02 MED ORDER — PROPOFOL 10 MG/ML IV BOLUS
INTRAVENOUS | Status: DC | PRN
  Administered 2017-09-02: 180 mg via INTRAVENOUS

## 2017-09-02 SURGICAL SUPPLY — 56 items
ADH SKN CLS APL DERMABOND .7 (GAUZE/BANDAGES/DRESSINGS) ×2
APPLICATOR COTTON TIP 6IN STRL (MISCELLANEOUS) ×4 IMPLANT
CATH FOLEY 2WAY SLVR 18FR 30CC (CATHETERS) ×4 IMPLANT
CATH ROBINSON RED A/P 16FR (CATHETERS) ×4 IMPLANT
CATH ROBINSON RED A/P 8FR (CATHETERS) ×4 IMPLANT
CATH TIEMANN FOLEY 18FR 5CC (CATHETERS) ×4 IMPLANT
CHLORAPREP W/TINT 26ML (MISCELLANEOUS) ×4 IMPLANT
CLIP VESOLOCK LG 6/CT PURPLE (CLIP) ×8 IMPLANT
COVER SURGICAL LIGHT HANDLE (MISCELLANEOUS) ×4 IMPLANT
COVER TIP SHEARS 8 DVNC (MISCELLANEOUS) ×2 IMPLANT
COVER TIP SHEARS 8MM DA VINCI (MISCELLANEOUS) ×4
CUTTER ECHEON FLEX ENDO 45 340 (ENDOMECHANICALS) ×4 IMPLANT
DECANTER SPIKE VIAL GLASS SM (MISCELLANEOUS) ×4 IMPLANT
DERMABOND ADVANCED (GAUZE/BANDAGES/DRESSINGS) ×2
DERMABOND ADVANCED .7 DNX12 (GAUZE/BANDAGES/DRESSINGS) IMPLANT
DRAPE ARM DVNC X/XI (DISPOSABLE) ×8 IMPLANT
DRAPE COLUMN DVNC XI (DISPOSABLE) ×2 IMPLANT
DRAPE DA VINCI XI ARM (DISPOSABLE) ×8
DRAPE DA VINCI XI COLUMN (DISPOSABLE) ×2
DRAPE SURG IRRIG POUCH 19X23 (DRAPES) ×4 IMPLANT
DRSG TEGADERM 4X4.75 (GAUZE/BANDAGES/DRESSINGS) ×4 IMPLANT
ELECT REM PT RETURN 15FT ADLT (MISCELLANEOUS) ×4 IMPLANT
GLOVE BIO SURGEON STRL SZ 6.5 (GLOVE) ×3 IMPLANT
GLOVE BIO SURGEONS STRL SZ 6.5 (GLOVE) ×1
GLOVE BIOGEL M STRL SZ7.5 (GLOVE) ×8 IMPLANT
GOWN STRL REUS W/TWL LRG LVL3 (GOWN DISPOSABLE) ×12 IMPLANT
HOLDER FOLEY CATH W/STRAP (MISCELLANEOUS) ×4 IMPLANT
IRRIG SUCT STRYKERFLOW 2 WTIP (MISCELLANEOUS) ×4
IRRIGATION SUCT STRKRFLW 2 WTP (MISCELLANEOUS) ×2 IMPLANT
IV LACTATED RINGERS 1000ML (IV SOLUTION) ×4 IMPLANT
NDL SAFETY ECLIPSE 18X1.5 (NEEDLE) ×2 IMPLANT
NEEDLE HYPO 18GX1.5 SHARP (NEEDLE) ×4
PACK ROBOT UROLOGY CUSTOM (CUSTOM PROCEDURE TRAY) ×4 IMPLANT
RELOAD STAPLE 45 4.1 GRN THCK (STAPLE) ×2 IMPLANT
SCISSORS LAP 5X45 EPIX DISP (ENDOMECHANICALS) ×2 IMPLANT
SEAL CANN UNIV 5-8 DVNC XI (MISCELLANEOUS) ×8 IMPLANT
SEAL XI 5MM-8MM UNIVERSAL (MISCELLANEOUS) ×8
SOLUTION ELECTROLUBE (MISCELLANEOUS) ×4 IMPLANT
STAPLE RELOAD 45 GRN (STAPLE) ×2 IMPLANT
STAPLE RELOAD 45MM GREEN (STAPLE) ×4
SUT ETHILON 3 0 PS 1 (SUTURE) ×4 IMPLANT
SUT MNCRL 3 0 RB1 (SUTURE) ×2 IMPLANT
SUT MNCRL 3 0 VIOLET RB1 (SUTURE) ×2 IMPLANT
SUT MNCRL AB 4-0 PS2 18 (SUTURE) ×8 IMPLANT
SUT MONOCRYL 3 0 RB1 (SUTURE) ×4
SUT VIC AB 0 CT1 27 (SUTURE) ×4
SUT VIC AB 0 CT1 27XBRD ANTBC (SUTURE) ×2 IMPLANT
SUT VIC AB 0 UR5 27 (SUTURE) ×4 IMPLANT
SUT VIC AB 2-0 SH 27 (SUTURE) ×8
SUT VIC AB 2-0 SH 27X BRD (SUTURE) ×2 IMPLANT
SUT VICRYL 0 UR6 27IN ABS (SUTURE) ×8 IMPLANT
SYR 27GX1/2 1ML LL SAFETY (SYRINGE) ×4 IMPLANT
TOWEL OR 17X26 10 PK STRL BLUE (TOWEL DISPOSABLE) ×4 IMPLANT
TOWEL OR NON WOVEN STRL DISP B (DISPOSABLE) ×4 IMPLANT
TUBING INSUFFLATION 10FT LAP (TUBING) IMPLANT
WATER STERILE IRR 1000ML POUR (IV SOLUTION) ×10 IMPLANT

## 2017-09-02 NOTE — Anesthesia Postprocedure Evaluation (Signed)
Anesthesia Post Note  Patient: Johnathan Sosa  Procedure(s) Performed: XI ROBOTIC ASSISTED LAPAROSCOPIC RADICAL PROSTATECTOMY LEVEL 2 (N/A ) LYMPHADENECTOMY, PELVIC (Bilateral )     Patient location during evaluation: PACU Anesthesia Type: General Level of consciousness: awake, awake and alert and oriented Pain management: pain level controlled Vital Signs Assessment: post-procedure vital signs reviewed and stable Respiratory status: spontaneous breathing, nonlabored ventilation and respiratory function stable Cardiovascular status: blood pressure returned to baseline Anesthetic complications: no    Last Vitals:  Vitals:   09/02/17 1515 09/02/17 1600  BP: 125/84 111/73  Pulse: 93 92  Resp: 17 18  Temp:  36.6 C  SpO2: 96% 96%    Last Pain:  Vitals:   09/02/17 1600  TempSrc: Oral  PainSc:                  [name] COKER

## 2017-09-02 NOTE — Op Note (Addendum)
Preoperative diagnosis: Clinically localized adenocarcinoma of the prostate (clinical stage T3a N0 M0)  Postoperative diagnosis: Clinically localized adenocarcinoma of the prostate (clinical stage T3a N0 M0)  Procedure:  1. Robotic assisted laparoscopic radical prostatectomy (unilateral nerve sparing)       2.   Bilateral pelvic lymphadenctomy   Surgeon: Pryor Curia. M.D.  Assistant: Debbrah Alar, PA-C  An assistant was required for this surgical procedure.  The duties of the assistant included but were not limited to suctioning, passing suture, camera manipulation, retraction. This procedure would not be able to be performed without an Environmental consultant.  Resident: Dr. Jonna Clark  Anesthesia: General  Complications: None  EBL: 225 mL  IVF:  2300 mL crystalloid  Specimens: 1. Prostate and seminal vesicles  Disposition of specimens: Pathology  Drains: 1. 20 Fr coude catheter 2. # 19 Blake pelvic drain  Indication: Johnathan Sosa is a 72 y.o. year old patient with clinically localized prostate cancer.  After a thorough review of the management options for treatment of prostate cancer, he elected to proceed with surgical therapy and the above procedure(s).  We have discussed the potential benefits and risks of the procedure, side effects of the proposed treatment, the likelihood of the patient achieving the goals of the procedure, and any potential problems that might occur during the procedure or recuperation. Informed consent has been obtained.  Description of procedure:  The patient was taken to the operating room and a general anesthetic was administered. He was given preoperative antibiotics, placed in the dorsal lithotomy position, and prepped and draped in the usual sterile fashion. Next a preoperative timeout was performed. A urethral catheter was placed into the bladder and a site was selected near the umbilicus for placement of the camera port. This was placed using a  standard open Hassan technique which allowed entry into the peritoneal cavity under direct vision and without difficulty. An 8 mm port was placed and a pneumoperitoneum established. The camera was then used to inspect the abdomen and there was no evidence of any intra-abdominal injuries or other abnormalities. The remaining abdominal ports were then placed. 8 mm robotic ports were placed in the right lower quadrant, left lower quadrant, and far left lateral abdominal wall. A 5 mm port was placed in the right upper quadrant and a 12 mm port was placed in the right lateral abdominal wall for laparoscopic assistance. All ports were placed under direct vision without difficulty. The surgical cart was then docked.   Utilizing the cautery scissors, the bladder was reflected posteriorly allowing entry into the space of Retzius and identification of the endopelvic fascia and prostate. The periprostatic fat was then removed from the prostate allowing full exposure of the endopelvic fascia. The endopelvic fascia was then incised from the apex back to the base of the prostate bilaterally and the underlying levator muscle fibers were swept laterally off the prostate thereby isolating the dorsal venous complex. The dorsal vein was then stapled and divided with a 45 mm Flex Echelon stapler. Attention then turned to the bladder neck which was divided anteriorly thereby allowing entry into the bladder and exposure of the urethral catheter. The catheter balloon was deflated and the catheter was brought into the operative field and used to retract the prostate anteriorly. The posterior bladder neck was then examined and was divided allowing further dissection between the bladder and prostate posteriorly until the vasa deferentia and seminal vessels were identified. The vasa deferentia were isolated, divided, and lifted anteriorly. The  seminal vesicles were dissected down to their tips with care to control the seminal vascular  arterial blood supply. These structures were then lifted anteriorly and the space between Denonvillier's fascia and the anterior rectum was developed with a combination of sharp and blunt dissection. This isolated the vascular pedicles of the prostate.  The lateral prostatic fascia was then sharply incised allowing release of the neurovascular bundles on the left side. The vascular pedicles of the prostate were then ligated with Weck clips between the prostate and neurovascular bundles and divided with sharp cold scissor dissection resulting in neurovascular bundle preservation. The neurovascular bundle was then separated off the apex of the prostate and urethra on the left.  A wide non-nerve sparing procedure was performed on the right side.  The urethra was then sharply transected allowing the prostate specimen to be disarticulated. The pelvis was copiously irrigated and hemostasis was ensured. There was no evidence for rectal injury.  Attention then turned to the right pelvic sidewall. The fibrofatty tissue between the external iliac vein, confluence of the iliac vessels, hypogastric artery, and Cooper's ligament was dissected free from the pelvic sidewall with care to preserve the obturator nerve. Weck clips were used for lymphostasis and hemostasis. An identical procedure was performed on the contralateral side and the lymphatic packets were removed for permanent pathologic analysis.  Attention then turned to the urethral anastomosis. A 2-0 Vicryl slip knot was placed between Denonvillier's fascia, the posterior bladder neck, and the posterior urethra to reapproximate these structures. A double-armed 3-0 Monocryl suture was then used to perform a 360 running tension-free anastomosis between the bladder neck and urethra. A new urethral catheter was then placed into the bladder and irrigated. There were no blood clots within the bladder and the anastomosis appeared to be watertight. A #19 Blake drain was  then brought through the left lateral 8 mm port site and positioned appropriately within the pelvis. It was secured to the skin with a nylon suture. The surgical cart was then undocked. The right lateral 12 mm port site was closed at the fascial level with a 0 Vicryl suture placed laparoscopically. All remaining ports were then removed under direct vision. The prostate specimen was removed intact within the Endopouch retrieval bag via the periumbilical camera port site. This fascial opening was closed with two running 0 Vicryl sutures. 0.25% Marcaine was then injected into all port sites and all incisions were reapproximated at the skin level with 4-0 Monocryl subcuticular sutures and Liquiband. The patient appeared to tolerate the procedure well and without complications. The patient was able to be extubated and transferred to the recovery unit in satisfactory condition.   Pryor Curia. MD  Attention then turned to the urethral anastomosis. A 2-0 Vicryl slip knot was placed between Denonvillier's fascia, the posterior bladder neck, and the posterior urethra to reapproximate these structures. A double-armed 3-0 Monocryl suture was then used to perform a 360 running tension-free anastomosis between the bladder neck and urethra. A new urethral catheter was then placed into the bladder and irrigated. There were no blood clots within the bladder and the anastomosis appeared to be watertight. A #19 Blake drain was then brought through the left lateral 8 mm port site and positioned appropriately within the pelvis. It was secured to the skin with a nylon suture. The surgical cart was then undocked. The right lateral 12 mm port site was closed at the fascial level with a 0 Vicryl suture placed laparoscopically. All remaining ports were  then removed under direct vision. The prostate specimen was removed intact within the Endopouch retrieval bag via the periumbilical camera port site. This fascial opening was  closed with two running 0 Vicryl sutures. 0.25% Marcaine was then injected into all port sites and all incisions were reapproximated at the skin level with 4-0 Monocryl subcuticular sutures and liquid skin adhesive. The patient appeared to tolerate the procedure well and without complications. The patient was able to be extubated and transferred to the recovery unit in satisfactory condition.  Pryor Curia MD

## 2017-09-02 NOTE — Transfer of Care (Signed)
Immediate Anesthesia Transfer of Care Note  Patient: Johnathan Sosa  Procedure(s) Performed: XI ROBOTIC ASSISTED LAPAROSCOPIC RADICAL PROSTATECTOMY LEVEL 2 (N/A ) LYMPHADENECTOMY, PELVIC (Bilateral )  Patient Location: PACU  Anesthesia Type:General  Level of Consciousness: sedated  Airway & Oxygen Therapy: Patient Spontanous Breathing and Patient connected to face mask oxygen  Post-op Assessment: Report given to RN and Post -op Vital signs reviewed and stable  Post vital signs: Reviewed and stable  Last Vitals:  Vitals:   09/02/17 0900  BP: 120/84  Pulse: 87  Resp: 18  Temp: 36.8 C  SpO2: 95%    Last Pain:  Vitals:   09/02/17 0956  TempSrc:   PainSc: 0-No pain         Complications: No apparent anesthesia complications

## 2017-09-02 NOTE — Interval H&P Note (Signed)
History and Physical Interval Note:  09/02/2017 10:17 AM  Johnathan Sosa  has presented today for surgery, with the diagnosis of PROSTATE CANCER  The various methods of treatment have been discussed with the patient and family. After consideration of risks, benefits and other options for treatment, the patient has consented to  Procedure(s): XI ROBOTIC ASSISTED LAPAROSCOPIC RADICAL PROSTATECTOMY LEVEL 2 (Bilateral) LYMPHADENECTOMY, PELVIC (Bilateral) as a surgical intervention .  The patient's history has been reviewed, patient examined, no change in status, stable for surgery.  I have reviewed the patient's chart and labs.  Questions were answered to the patient's satisfaction.     Jamille Fisher,LES

## 2017-09-02 NOTE — Discharge Instructions (Signed)

## 2017-09-02 NOTE — Progress Notes (Signed)
Patient ID: Johnathan Sosa, male   DOB: Jun 23, 1946, 72 y.o.   MRN: [id number]  Post-op note  Subjective: The patient is doing well.  No complaints except expected bladder spasms.  Objective: Vital signs in last 24 hours: Temp:  [97.9 F (36.6 C)-98.6 F (37 C)] 97.9 F (36.6 C) (01/03 1600) Pulse Rate:  [87-98] 92 (01/03 1600) Resp:  [12-21] 18 (01/03 1600) BP: (111-128)/(73-86) 111/73 (01/03 1600) SpO2:  [95 %-99 %] 96 % (01/03 1600) Weight:  [93.9 kg (207 lb)] 93.9 kg (207 lb) (01/03 0912)  Intake/Output from previous day: No intake/output data recorded. Intake/Output this shift: Total I/O In: 3800 [I.V.:2700; IV Piggyback:1100] Out: 350 [Urine:30; Drains:70; Blood:250]  Physical Exam:  General: Alert and oriented. Abdomen: Soft, Nondistended. Incisions: Clean and dry. GU: Urine draining well.  Lab Results: Recent Labs    09/02/17 1450  HGB 14.4  HCT 41.1    Assessment/Plan: POD#0   1) Continue to monitor, ambulate, IS   Pryor Curia. MD   LOS: 0 days   Jahniyah Revere,LES 09/02/2017, 4:10 PM

## 2017-09-02 NOTE — Anesthesia Preprocedure Evaluation (Addendum)

## 2017-09-02 NOTE — Anesthesia Procedure Notes (Signed)
Procedure Name: Intubation Date/Time: 09/02/2017 11:12 AM Performed by: Sharlette Dense, CRNA Patient Re-evaluated:Patient Re-evaluated prior to induction Oxygen Delivery Method: Circle system utilized Preoxygenation: Pre-oxygenation with 100% oxygen Induction Type: IV induction Ventilation: Mask ventilation without difficulty and Oral airway inserted - appropriate to patient size Laryngoscope Size: Miller and 3 Grade View: Grade I Tube type: Oral Tube size: 8.0 mm Number of attempts: 1 Airway Equipment and Method: Stylet Placement Confirmation: ETT inserted through vocal cords under direct vision,  positive ETCO2 and breath sounds checked- equal and bilateral Secured at: 23 cm Tube secured with: Tape Dental Injury: Teeth and Oropharynx as per pre-operative assessment

## 2017-09-03 ENCOUNTER — Encounter (HOSPITAL_COMMUNITY): Payer: Self-pay | Admitting: Urology

## 2017-09-03 DIAGNOSIS — F419 Anxiety disorder, unspecified: Secondary | ICD-10-CM | POA: Diagnosis not present

## 2017-09-03 DIAGNOSIS — C61 Malignant neoplasm of prostate: Secondary | ICD-10-CM | POA: Diagnosis not present

## 2017-09-03 DIAGNOSIS — M109 Gout, unspecified: Secondary | ICD-10-CM | POA: Diagnosis not present

## 2017-09-03 DIAGNOSIS — I10 Essential (primary) hypertension: Secondary | ICD-10-CM | POA: Diagnosis not present

## 2017-09-03 DIAGNOSIS — G4730 Sleep apnea, unspecified: Secondary | ICD-10-CM | POA: Diagnosis not present

## 2017-09-03 DIAGNOSIS — E785 Hyperlipidemia, unspecified: Secondary | ICD-10-CM | POA: Diagnosis not present

## 2017-09-03 DIAGNOSIS — Z87891 Personal history of nicotine dependence: Secondary | ICD-10-CM | POA: Diagnosis not present

## 2017-09-03 DIAGNOSIS — M1990 Unspecified osteoarthritis, unspecified site: Secondary | ICD-10-CM | POA: Diagnosis not present

## 2017-09-03 DIAGNOSIS — F329 Major depressive disorder, single episode, unspecified: Secondary | ICD-10-CM | POA: Diagnosis not present

## 2017-09-03 LAB — HEMOGLOBIN AND HEMATOCRIT, BLOOD
HCT: 35 % — ABNORMAL LOW (ref 39.0–52.0)
HEMOGLOBIN: 12.3 g/dL — AB (ref 13.0–17.0)

## 2017-09-03 MED ORDER — BISACODYL 10 MG RE SUPP
10.0000 mg | Freq: Once | RECTAL | Status: AC
  Administered 2017-09-03: 10 mg via RECTAL
  Filled 2017-09-03: qty 1

## 2017-09-03 MED ORDER — HYDROCODONE-ACETAMINOPHEN 5-325 MG PO TABS
1.0000 | ORAL_TABLET | Freq: Four times a day (QID) | ORAL | Status: DC | PRN
  Administered 2017-09-03 (×2): 1 via ORAL
  Filled 2017-09-03 (×2): qty 1

## 2017-09-03 NOTE — Progress Notes (Addendum)
Patient ID: Johnathan Sosa, male   DOB: [birth year], 72 y.o.   MRN: [id number]  Post-op note  Subjective: The patient is doing well.  Didn't ambulate overnight, frustrated due to lack of help. Tolerating clears without nausea or vomiting.   Objective: Vital signs in last 24 hours: Temp:  [97.9 F (36.6 C)-98.6 F (37 C)] 98.6 F (37 C) (01/04 0448) Pulse Rate:  [87-100] 100 (01/04 0448) Resp:  [12-21] 18 (01/04 0448) BP: (106-128)/(67-86) 106/67 (01/04 0448) SpO2:  [95 %-99 %] 98 % (01/04 0448) Weight:  [93.9 kg (207 lb)] 93.9 kg (207 lb) (01/03 0912)  Intake/Output from previous day: 01/03 0701 - 01/04 0700 In: 4000 [I.V.:2900; IV Piggyback:1100] Out: 3329 [Urine:2730; Drains:110; Blood:250] Intake/Output this shift: No intake/output data recorded.  Physical Exam:  General: Alert and oriented. Abdomen: Soft, Nondistended. Incisions: Clean and dry. JP draining serosanguinous fluid, minimal  GU: Urine draining well.   Lab Results: Recent Labs    09/02/17 1450 09/03/17 0446  HGB 14.4 12.3*  HCT 41.1 35.0*    Assessment/Plan: POD#1 s/p RALP, doing overall well   1) Continue to monitor, ambulate, IS 2) Continues clears untile discharge 3) Will d/c JP drain later today if continues to put out minimal SS fluid 4) Likely d/c today    Jonna Clark, MD   LOS: 0 days   FILIPPOU, PAULINE L 09/03/2017, 7:38 AM  I have seen and examined the patient and agree with the above assessment and plan.

## 2017-09-03 NOTE — Discharge Summary (Signed)
  Date of admission: 09/02/2017  Date of discharge: 09/03/2017  Admission diagnosis: Prostate Cancer  Discharge diagnosis: Prostate Cancer  History and Physical: For full details, please see admission history and physical. Briefly, Johnathan Sosa is a 72 y.o. gentleman with localized prostate cancer.  After discussing management/treatment options, he elected to proceed with surgical treatment.  Hospital Course: Johnathan Sosa was taken to the operating room on 09/02/2017 and underwent a robotic assisted laparoscopic radical prostatectomy. He tolerated this procedure well and without complications. Postoperatively, he was able to be transferred to a regular hospital room following recovery from anesthesia.  He was able to begin ambulating the night of surgery. He remained hemodynamically stable overnight.  He had excellent urine output with appropriately minimal output from his pelvic drain and his pelvic drain was removed on POD #1.  He was transitioned to oral pain medication, tolerated a clear liquid diet, and had met all discharge criteria and was able to be discharged home later on POD#1.  Laboratory values:  Recent Labs    09/02/17 1450 09/03/17 0446  HGB 14.4 12.3*  HCT 41.1 35.0*    Disposition: Home  Discharge instruction: He was instructed to be ambulatory but to refrain from heavy lifting, strenuous activity, or driving. He was instructed on urethral catheter care.  Discharge medications:   Allergies as of 09/03/2017      Reactions   Black Pepper [piper] Swelling   redness of skin, profuse sweating   Ritalin [methylphenidate Hcl] Other (See Comments)   Joint's ache.   Sulfa Antibiotics Itching, Rash      Medication List    STOP taking these medications   aspirin EC 81 MG tablet   D 5000 5000 units Tabs Generic drug:  Cholecalciferol   diphenhydramine-acetaminophen 25-500 MG Tabs tablet Commonly known as:  TYLENOL PM   pyridOXINE 50 MG tablet Commonly known as:  VITAMIN  B-6   Vitamin D (Ergocalciferol) 50000 units Caps capsule Commonly known as:  DRISDOL     TAKE these medications   acetaminophen 500 MG tablet Commonly known as:  TYLENOL Take 500 mg by mouth every 8 (eight) hours as needed for mild pain or headache.   allopurinol 300 MG tablet Commonly known as:  ZYLOPRIM Take 300 mg by mouth daily.   ALPRAZolam 0.25 MG tablet Commonly known as:  XANAX Prn use for VERTIGO po, do not exceed 2 a day.   amLODipine 2.5 MG tablet Commonly known as:  NORVASC Take 1 tablet (2.5 mg total) by mouth daily.   atorvastatin 10 MG tablet Commonly known as:  LIPITOR Take 1 tablet (10 mg total) by mouth daily. What changed:  when to take this   buPROPion 300 MG 24 hr tablet Commonly known as:  WELLBUTRIN XL Take 300 mg by mouth daily.   ciprofloxacin 500 MG tablet Commonly known as:  CIPRO Take 1 tablet (500 mg total) by mouth 2 (two) times daily. Start day prior to office visit for foley removal   clonazePAM 1 MG tablet Commonly known as:  KLONOPIN Take 1 tablet (1 mg total) by mouth at bedtime. Take 1 tablet at bedtime PRN for sleep What changed:  additional instructions   HYDROcodone-acetaminophen 5-325 MG tablet Commonly known as:  NORCO Take 1-2 tablets by mouth every 6 (six) hours as needed.       Followup: He will followup in 1 week for catheter removal and to discuss his surgical pathology results.

## 2017-09-03 NOTE — Care Management Note (Signed)
Case Management Note  Patient Details  Name: Johnathan Sosa MRN: [id number] Date of Birth: 31-Dec-1945  Subjective/Objective:  72 y/o m admitted w/Prostate Ca. From home.                  Action/Plan:d/c plan home.   Expected Discharge Date:                  Expected Discharge Plan:  Home/Self Care  In-House Referral:     Discharge planning Services  CM Consult  Post Acute Care Choice:    Choice offered to:     DME Arranged:    DME Agency:     HH Arranged:    HH Agency:     Status of Service:  In process, will continue to follow  If discussed at Long Length of Stay Meetings, dates discussed:    Additional Comments:  Dessa Phi, RN 09/03/2017, 2:28 PM

## 2017-09-04 ENCOUNTER — Other Ambulatory Visit: Payer: Self-pay

## 2017-09-04 ENCOUNTER — Emergency Department (HOSPITAL_COMMUNITY): Payer: Medicare HMO

## 2017-09-04 ENCOUNTER — Observation Stay (HOSPITAL_COMMUNITY)
Admission: EM | Admit: 2017-09-04 | Discharge: 2017-09-06 | Disposition: A | Discharge status: Home / Self Care | Payer: Medicare HMO | Attending: Urology | Admitting: Urology

## 2017-09-04 DIAGNOSIS — K567 Ileus, unspecified: Principal | ICD-10-CM | POA: Insufficient documentation

## 2017-09-04 DIAGNOSIS — K4090 Unilateral inguinal hernia, without obstruction or gangrene, not specified as recurrent: Secondary | ICD-10-CM | POA: Diagnosis not present

## 2017-09-04 DIAGNOSIS — K219 Gastro-esophageal reflux disease without esophagitis: Secondary | ICD-10-CM | POA: Diagnosis not present

## 2017-09-04 DIAGNOSIS — C61 Malignant neoplasm of prostate: Secondary | ICD-10-CM | POA: Diagnosis not present

## 2017-09-04 DIAGNOSIS — F0280 Dementia in other diseases classified elsewhere without behavioral disturbance: Secondary | ICD-10-CM | POA: Diagnosis not present

## 2017-09-04 DIAGNOSIS — Z79899 Other long term (current) drug therapy: Secondary | ICD-10-CM | POA: Insufficient documentation

## 2017-09-04 DIAGNOSIS — Z9079 Acquired absence of other genital organ(s): Secondary | ICD-10-CM | POA: Insufficient documentation

## 2017-09-04 DIAGNOSIS — E785 Hyperlipidemia, unspecified: Secondary | ICD-10-CM | POA: Insufficient documentation

## 2017-09-04 DIAGNOSIS — G4700 Insomnia, unspecified: Secondary | ICD-10-CM | POA: Diagnosis not present

## 2017-09-04 DIAGNOSIS — J8410 Pulmonary fibrosis, unspecified: Secondary | ICD-10-CM | POA: Insufficient documentation

## 2017-09-04 DIAGNOSIS — G8918 Other acute postprocedural pain: Secondary | ICD-10-CM

## 2017-09-04 DIAGNOSIS — I700 Atherosclerosis of aorta: Secondary | ICD-10-CM | POA: Insufficient documentation

## 2017-09-04 DIAGNOSIS — I2510 Atherosclerotic heart disease of native coronary artery without angina pectoris: Secondary | ICD-10-CM | POA: Insufficient documentation

## 2017-09-04 DIAGNOSIS — K9189 Other postprocedural complications and disorders of digestive system: Secondary | ICD-10-CM | POA: Insufficient documentation

## 2017-09-04 DIAGNOSIS — M109 Gout, unspecified: Secondary | ICD-10-CM | POA: Insufficient documentation

## 2017-09-04 DIAGNOSIS — F329 Major depressive disorder, single episode, unspecified: Secondary | ICD-10-CM | POA: Insufficient documentation

## 2017-09-04 DIAGNOSIS — I10 Essential (primary) hypertension: Secondary | ICD-10-CM | POA: Insufficient documentation

## 2017-09-04 DIAGNOSIS — Z87891 Personal history of nicotine dependence: Secondary | ICD-10-CM | POA: Insufficient documentation

## 2017-09-04 DIAGNOSIS — R0602 Shortness of breath: Secondary | ICD-10-CM | POA: Diagnosis not present

## 2017-09-04 DIAGNOSIS — Z87820 Personal history of traumatic brain injury: Secondary | ICD-10-CM | POA: Insufficient documentation

## 2017-09-04 DIAGNOSIS — R0902 Hypoxemia: Secondary | ICD-10-CM | POA: Diagnosis not present

## 2017-09-04 LAB — COMPREHENSIVE METABOLIC PANEL
ALT: 22 U/L (ref 17–63)
AST: 27 U/L (ref 15–41)
Albumin: 3.5 g/dL (ref 3.5–5.0)
Alkaline Phosphatase: 52 U/L (ref 38–126)
Anion gap: 7 (ref 5–15)
BILIRUBIN TOTAL: 0.6 mg/dL (ref 0.3–1.2)
BUN: 13 mg/dL (ref 6–20)
CHLORIDE: 107 mmol/L (ref 101–111)
CO2: 23 mmol/L (ref 22–32)
CREATININE: 1.18 mg/dL (ref 0.61–1.24)
Calcium: 8.4 mg/dL — ABNORMAL LOW (ref 8.9–10.3)
Glucose, Bld: 105 mg/dL — ABNORMAL HIGH (ref 65–99)
POTASSIUM: 3.8 mmol/L (ref 3.5–5.1)
Sodium: 137 mmol/L (ref 135–145)
TOTAL PROTEIN: 6.1 g/dL — AB (ref 6.5–8.1)

## 2017-09-04 LAB — URINALYSIS, ROUTINE W REFLEX MICROSCOPIC
BILIRUBIN URINE: NEGATIVE
Glucose, UA: NEGATIVE mg/dL
Ketones, ur: NEGATIVE mg/dL
Nitrite: NEGATIVE
PH: 5 (ref 5.0–8.0)
Protein, ur: NEGATIVE mg/dL
SPECIFIC GRAVITY, URINE: 1.011 (ref 1.005–1.030)
SQUAMOUS EPITHELIAL / LPF: NONE SEEN

## 2017-09-04 LAB — CBC WITH DIFFERENTIAL/PLATELET
Basophils Absolute: 0 10*3/uL (ref 0.0–0.1)
Basophils Relative: 0 %
EOS PCT: 1 %
Eosinophils Absolute: 0.2 10*3/uL (ref 0.0–0.7)
HEMATOCRIT: 37.1 % — AB (ref 39.0–52.0)
Hemoglobin: 12.7 g/dL — ABNORMAL LOW (ref 13.0–17.0)
LYMPHS ABS: 3.6 10*3/uL (ref 0.7–4.0)
LYMPHS PCT: 23 %
MCH: 30.2 pg (ref 26.0–34.0)
MCHC: 34.2 g/dL (ref 30.0–36.0)
MCV: 88.3 fL (ref 78.0–100.0)
MONO ABS: 1.1 10*3/uL — AB (ref 0.1–1.0)
Monocytes Relative: 7 %
NEUTROS ABS: 11 10*3/uL — AB (ref 1.7–7.7)
Neutrophils Relative %: 69 %
PLATELETS: 176 10*3/uL (ref 150–400)
RBC: 4.2 MIL/uL — ABNORMAL LOW (ref 4.22–5.81)
RDW: 13.4 % (ref 11.5–15.5)
WBC: 16 10*3/uL — ABNORMAL HIGH (ref 4.0–10.5)

## 2017-09-04 LAB — I-STAT TROPONIN, ED: TROPONIN I, POC: 0 ng/mL (ref 0.00–0.08)

## 2017-09-04 LAB — LIPASE, BLOOD: LIPASE: 22 U/L (ref 11–51)

## 2017-09-04 IMAGING — CT CT ABD-PELV W/ CM
2 of 6 series · 15 of 46 positions shown, 17 images · IV contrast (ISOVUE)
Comparison: CT pelvis [DATE]. CT chest abdomen and pelvis
[DATE]

CLINICAL DATA: Prostate surgery on [REDACTED]. Chest released from
hospital yesterday. Shortness of breath.

EXAM:
CT ABDOMEN AND PELVIS WITH CONTRAST
TECHNIQUE: Multidetector CT imaging of the abdomen and pelvis was performed
using the standard protocol following bolus administration of
intravenous contrast.
CONTRAST:  100 mL [MR]

[Series 2: axial st · axial · 0.90mm/px · z∈[-285,+190]mm · 12 of 113 slices shown, 14 images]
[im 9/113  soft-tissue]
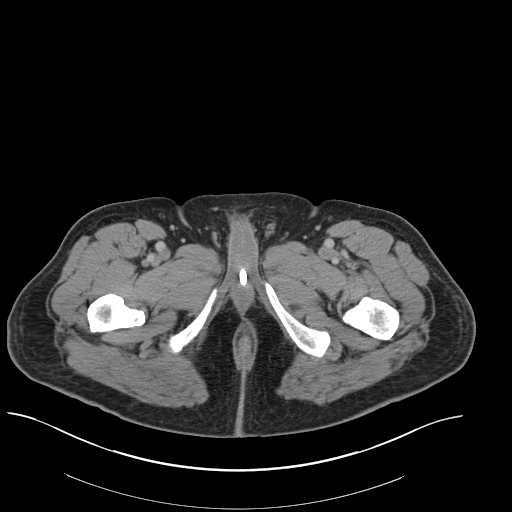
[im 9/113  bone]
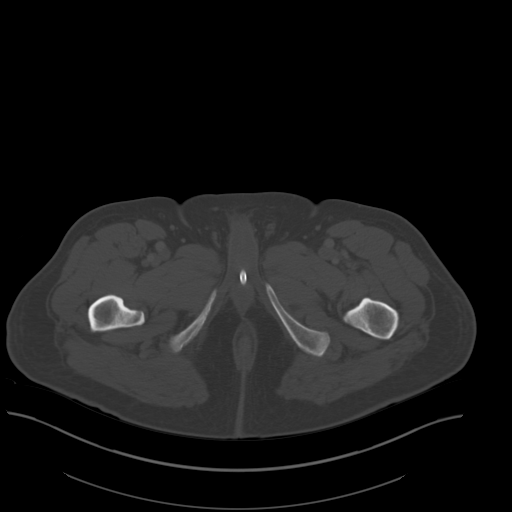
[im 18/113  soft-tissue]
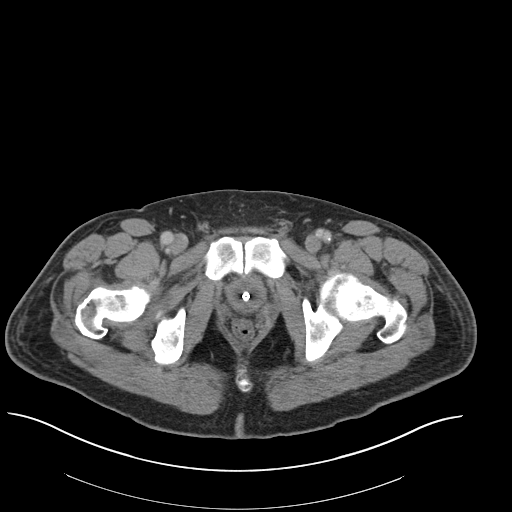
[im 26/113  soft-tissue]
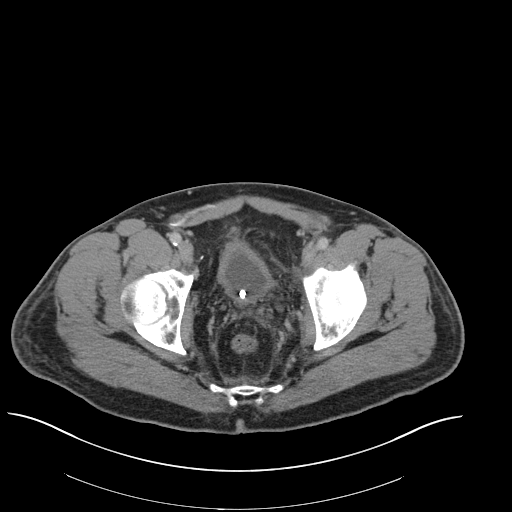
[im 35/113  soft-tissue]
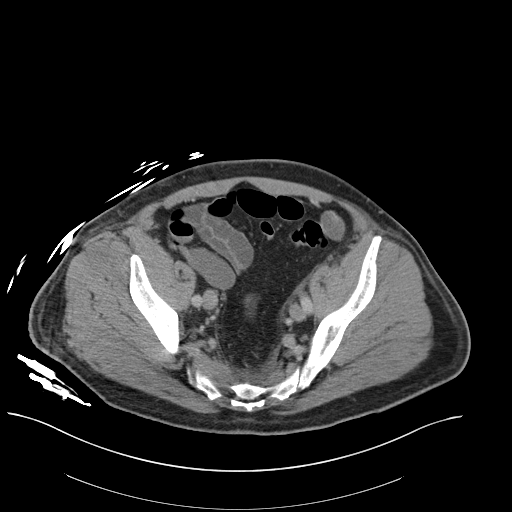
[im 44/113  soft-tissue]
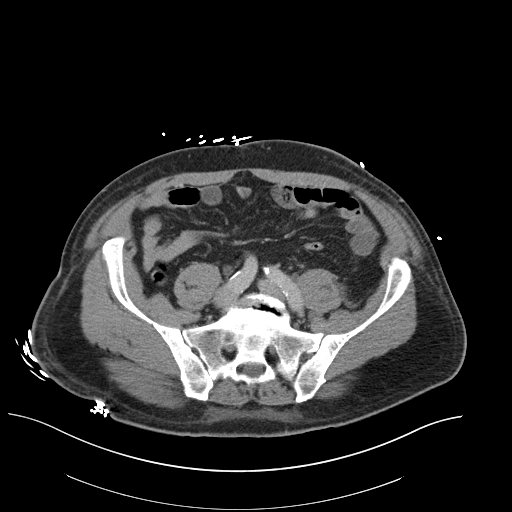
[im 52/113  soft-tissue]
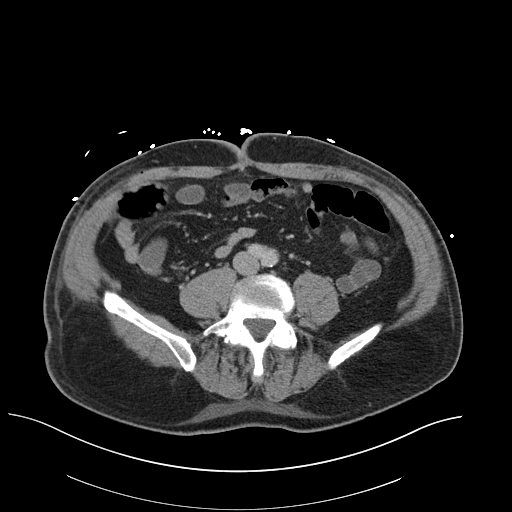
[im 61/113  soft-tissue]
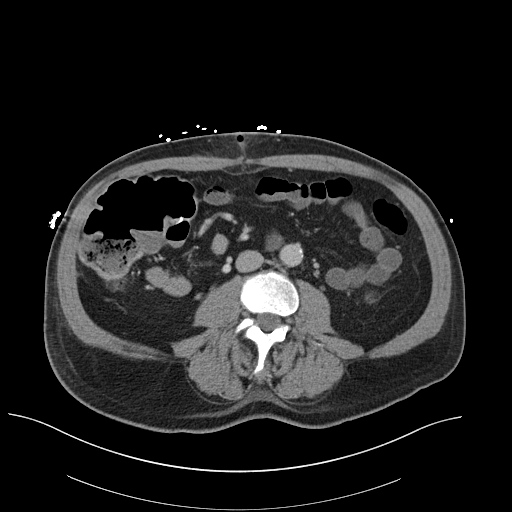
[im 69/113  soft-tissue]
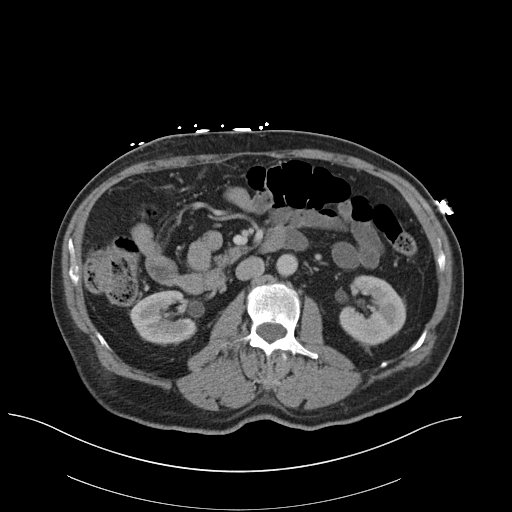
[im 78/113  soft-tissue]
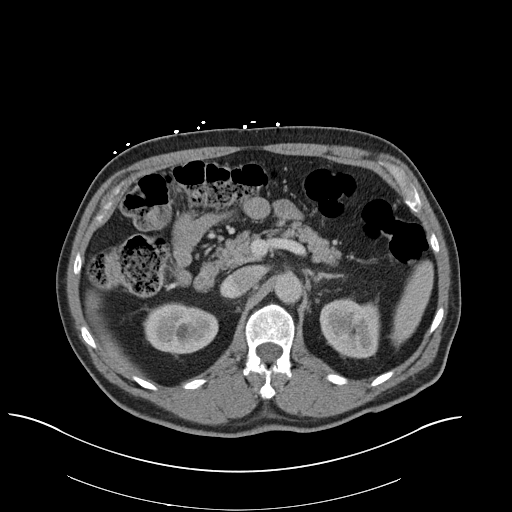
[im 78/113  bone]
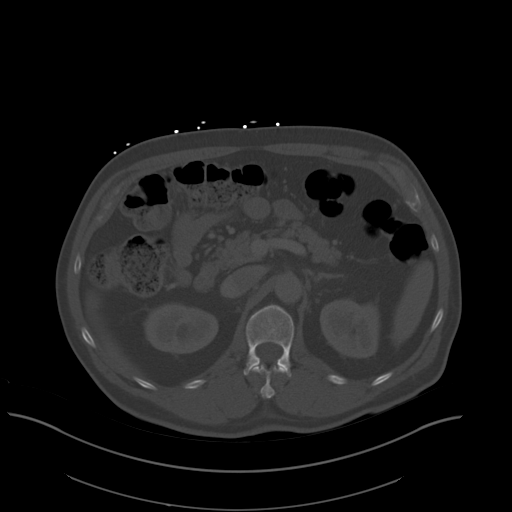
[im 87/113  soft-tissue]
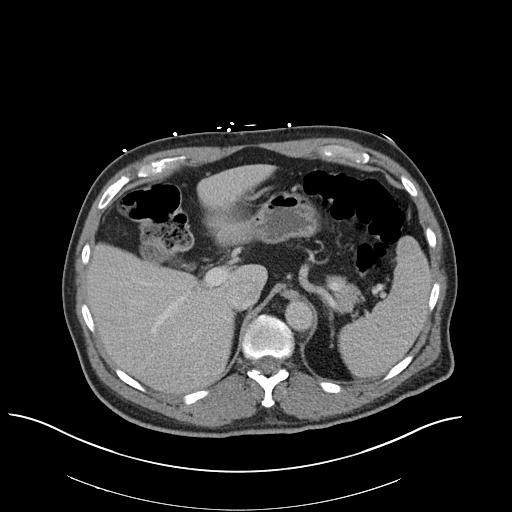
[im 95/113  soft-tissue]
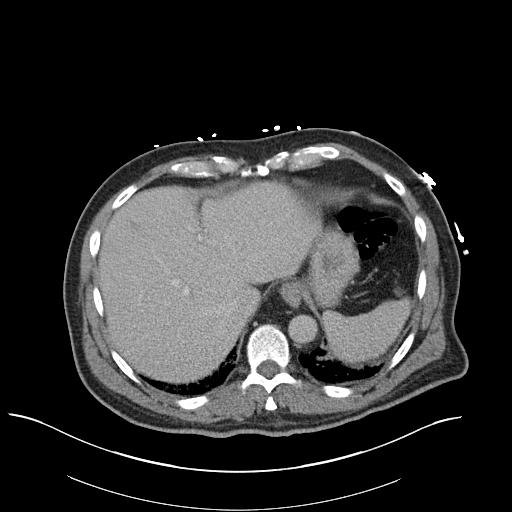
[im 104/113  soft-tissue]
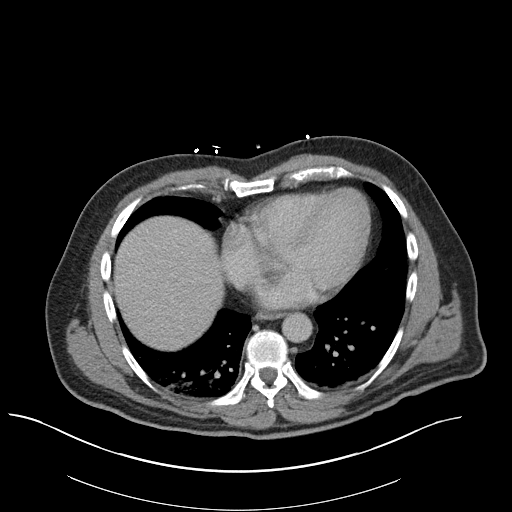

[Series 6: coronal st · coronal · 0.78mm/px · 3 of 99 slices shown]
[im 33/99  soft-tissue]
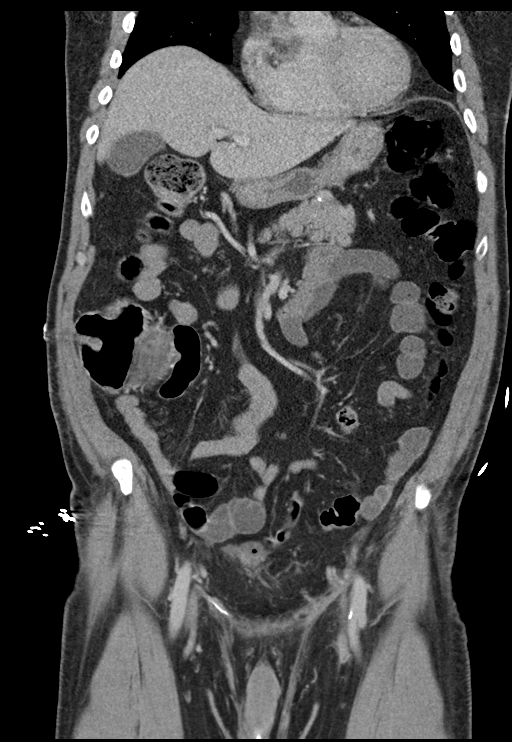
[im 44/99  soft-tissue]
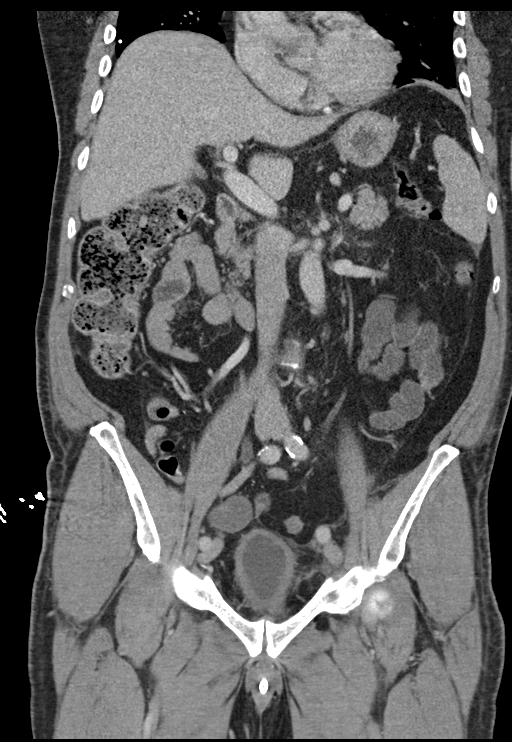
[im 55/99  soft-tissue]
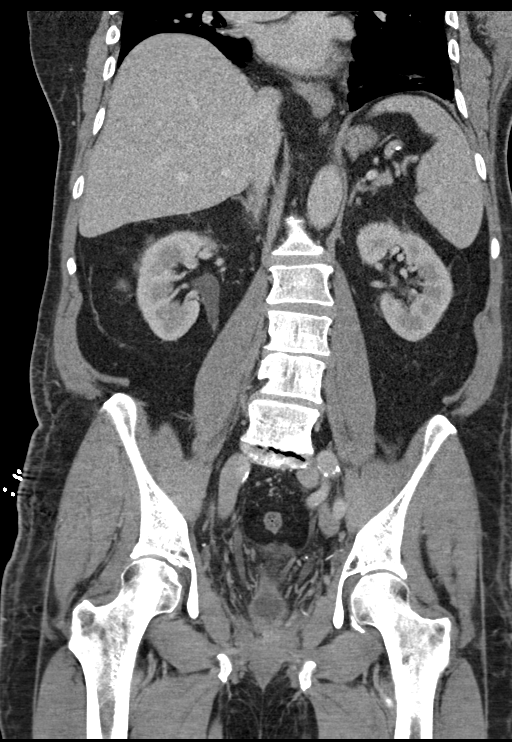

[15 of 46 positions shown; findings below may reference images not displayed]

FINDINGS: Lower chest: Atelectasis in the lung bases. Calcified granulomas in
the left lung with calcified hilar lymph nodes bilaterally. Coronary
artery calcifications.

Hepatobiliary: No focal liver abnormality is seen. No gallstones,
gallbladder wall thickening, or biliary dilatation.

Pancreas: Unremarkable. No pancreatic ductal dilatation or
surrounding inflammatory changes.

Spleen: Normal in size without focal abnormality.

Adrenals/Urinary Tract: No adrenal gland nodules. Small bilateral
renal parenchymal cysts. Nephrograms are homogeneous. No
hydronephrosis or hydroureter. The bladder is decompressed with a
Foley catheter in place. Bladder wall thickening is likely due to
hypertrophy and under distention. Focal increased attenuation in the
prostate bed is present on early and delayed studies, likely
representing surgical clip or suture material. No definite evidence
of contrast extravasation from the bladder.

Stomach/Bowel: Stomach, small bowel, stomach and small bowel are
mostly decompressed. Scattered stool throughout the colon without
colonic distention. No wall thickening or inflammatory changes are
suggested. Appendix appears to be surgically absent. Mild
infiltration in the presacral fat is likely postoperative.

Vascular/Lymphatic: Aortic atherosclerosis. No enlarged abdominal or
pelvic lymph nodes.

Reproductive: Prostate gland is surgically absent.

Other: Postoperative changes consistent with mesh repair of a right
inguinal hernia. No evidence of residual or recurrent hernia.
Minimal subcutaneous gas demonstrated in the anterior abdominal
wall, likely postoperative. No free intraperitoneal air is
appreciated.

Musculoskeletal: Degenerative changes of the lumbosacral interspace.
No destructive or expansile bone lesions are identified. Probable
benign bone island in the right sacrum.
IMPRESSION: 1. Postoperative changes with prostate resection. Foley catheter
decompresses the bladder. Small amount of edema or stranding in the
presacral space is likely postoperative. Small amount of
subcutaneous gas is also likely postoperative.
2. Previous right inguinal hernia repair without recurrence.
3. No small or large bowel distention or inflammatory change.
4. Atelectasis in the lung bases.
5. Aortic atherosclerosis.

## 2017-09-04 MED ORDER — BUPROPION HCL ER (XL) 300 MG PO TB24
300.0000 mg | ORAL_TABLET | Freq: Every day | ORAL | Status: DC
  Administered 2017-09-05 – 2017-09-06 (×2): 300 mg via ORAL
  Filled 2017-09-04 (×2): qty 1

## 2017-09-04 MED ORDER — TRAMADOL HCL 50 MG PO TABS: 50.0000 mg | ORAL_TABLET | Freq: Four times a day (QID) | ORAL | Status: DC | PRN

## 2017-09-04 MED ORDER — ACETAMINOPHEN 500 MG PO TABS
1000.0000 mg | ORAL_TABLET | Freq: Three times a day (TID) | ORAL | Status: DC
  Administered 2017-09-04 – 2017-09-06 (×6): 1000 mg via ORAL
  Filled 2017-09-04 (×6): qty 2

## 2017-09-04 MED ORDER — IOPAMIDOL (ISOVUE-300) INJECTION 61%
INTRAVENOUS | Status: AC
  Administered 2017-09-04: 100 mL via INTRAVENOUS
  Filled 2017-09-04: qty 100

## 2017-09-04 MED ORDER — ALLOPURINOL 300 MG PO TABS
300.0000 mg | ORAL_TABLET | Freq: Every day | ORAL | Status: DC
  Administered 2017-09-05 – 2017-09-06 (×2): 300 mg via ORAL
  Filled 2017-09-04 (×3): qty 1

## 2017-09-04 MED ORDER — MORPHINE SULFATE (PF) 4 MG/ML IV SOLN
4.0000 mg | Freq: Once | INTRAVENOUS | Status: AC
  Administered 2017-09-04: 4 mg via INTRAVENOUS
  Filled 2017-09-04: qty 1

## 2017-09-04 MED ORDER — ATORVASTATIN CALCIUM 10 MG PO TABS
10.0000 mg | ORAL_TABLET | Freq: Every day | ORAL | Status: DC
  Administered 2017-09-04 – 2017-09-05 (×2): 10 mg via ORAL
  Filled 2017-09-04 (×2): qty 1

## 2017-09-04 MED ORDER — HYDROMORPHONE HCL 1 MG/ML IJ SOLN: 0.5000 mg | INTRAMUSCULAR | Status: DC | PRN

## 2017-09-04 MED ORDER — ONDANSETRON HCL 4 MG/2ML IJ SOLN: 4.0000 mg | INTRAMUSCULAR | Status: DC | PRN

## 2017-09-04 MED ORDER — AMLODIPINE BESYLATE 5 MG PO TABS
2.5000 mg | ORAL_TABLET | Freq: Every day | ORAL | Status: DC
  Administered 2017-09-05 – 2017-09-06 (×2): 2.5 mg via ORAL
  Filled 2017-09-04 (×2): qty 1

## 2017-09-04 MED ORDER — SENNOSIDES-DOCUSATE SODIUM 8.6-50 MG PO TABS
1.0000 | ORAL_TABLET | Freq: Two times a day (BID) | ORAL | Status: DC
  Administered 2017-09-04 – 2017-09-06 (×3): 1 via ORAL
  Filled 2017-09-04 (×4): qty 1

## 2017-09-04 MED ORDER — KETOROLAC TROMETHAMINE 15 MG/ML IJ SOLN
15.0000 mg | Freq: Four times a day (QID) | INTRAMUSCULAR | Status: DC
  Administered 2017-09-04 – 2017-09-06 (×7): 15 mg via INTRAVENOUS
  Filled 2017-09-04 (×7): qty 1

## 2017-09-04 MED ORDER — BISACODYL 10 MG RE SUPP
10.0000 mg | Freq: Once | RECTAL | Status: AC
  Administered 2017-09-05: 10 mg via RECTAL
  Filled 2017-09-04: qty 1

## 2017-09-04 MED ORDER — SODIUM CHLORIDE 0.9 % IV SOLN
INTRAVENOUS | Status: DC
  Administered 2017-09-04: via INTRAVENOUS

## 2017-09-04 MED ORDER — SODIUM CHLORIDE 0.9 % IV BOLUS (SEPSIS)
1000.0000 mL | Freq: Once | INTRAVENOUS | Status: AC
  Administered 2017-09-04: 1000 mL via INTRAVENOUS

## 2017-09-04 MED ORDER — CLONAZEPAM 1 MG PO TABS
1.0000 mg | ORAL_TABLET | Freq: Every day | ORAL | Status: DC
  Administered 2017-09-04 – 2017-09-05 (×2): 1 mg via ORAL
  Filled 2017-09-04: qty 1
  Filled 2017-09-04: qty 2
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
## 2017-09-04 NOTE — ED Notes (Signed)
Pt states he is not short of breath due to breathing problems but his abdomen feels so full he cannot take a deep breath.
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
## 2017-09-04 NOTE — ED Notes (Signed)
Patient transported to CT 

## 2017-09-04 NOTE — ED Triage Notes (Signed)
Per EMS: Pt is coming from home. Pt had surgery done on his prostate on Thursday and was just released from hospital yesterday. Pt has had some shortness of breath and difficulty taking a deep breath. No reported fever. No nausea or vomiting reported.  Last Vitals:  147/97 HR 100 94% O2 on RA CBG 124
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
## 2017-09-05 ENCOUNTER — Encounter (HOSPITAL_COMMUNITY): Payer: Self-pay

## 2017-09-05 NOTE — Progress Notes (Signed)
Patient encouraged to walk this shift- stated "actually the doctor told me I shouldn't walk so much." Pt also states he had a "very large" BM this shift.

## 2017-09-05 NOTE — Progress Notes (Signed)
Subjective/Chief Complaint:   1 - Post-Op Ileus / Abdominal Pain - s/p uncomplicated prostatectomy 2 days ago with increasing abdominal pain to point of splinting with deep inspiration, pt and wife quite concerned. NO fevers. Cr 1.1, Hgb 12.7, CT with expected post-op changes and ?mild ileus (no transition points, no large free air, no urine leak).   Admitted 1/5 for observation and pain control. Now on scheduled non-narcotics with tylenol, toradol, and tramadol for breakthrough only.   2 - Prostate Cancer - s/p prostatectomy with bilateral node dissection 09/02/16. Final path pending.   Today "Al" is stable. Still c/o abd pain but exam and objective findings very reassuring.     Objective: Vital signs in last 24 hours: Temp:  [98.1 F (36.7 C)-98.4 F (36.9 C)] 98.4 F (36.9 C) (01/06 0400) Pulse Rate:  [82-107] 82 (01/06 0400) Resp:  [17-22] 18 (01/06 0400) BP: (126-147)/(81-97) 126/81 (01/06 0400) SpO2:  [93 %-97 %] 96 % (01/06 0400) Weight:  [93.9 kg (207 lb)-94.9 kg (209 lb 3.5 oz)] 94.9 kg (209 lb 3.5 oz) (01/05 2305) Last BM Date: 08/31/17(Per patient)  Intake/Output from previous day: 01/05 0701 - 01/06 0700 In: 1255.8 [I.V.:255.8; IV Piggyback:1000] Out: 650 [Urine:650] Intake/Output this shift: Total I/O In: -  Out: 425 [Urine:425]  General appearance: alert, cooperative, appears stated age and sleeping but arousable and AOx3, NAD whatsoever.  Eyes: negative Nose: Nares normal. Septum midline. Mucosa normal. No drainage or sinus tenderness. Throat: lips, mucosa, and tongue normal; teeth and gums normal Neck: supple, symmetrical, trachea midline Back: symmetric, no curvature. ROM normal. No CVA tenderness. Resp: NON-labored on room air.  Cardio: Nl rate GI: soft, non-tender; bowel sounds normal; no masses,  no organomegaly and recent surgical sites / drian sites c/d/i. NO hernias. NO rebound / guarding / or significant distension.  Male genitalia: normal,  foley in place with yellow urine.  Extremities: extremities normal, atraumatic, no cyanosis or edema Skin: Skin color, texture, turgor normal. No rashes or lesions Lymph nodes: Cervical, supraclavicular, and axillary nodes normal. Neurologic: Grossly normal  Lab Results:  Recent Labs    09/03/17 0446 09/04/17 1825  WBC  --  16.0*  HGB 12.3* 12.7*  HCT 35.0* 37.1*  PLT  --  176   BMET Recent Labs    09/04/17 1825  NA 137  K 3.8  CL 107  CO2 23  GLUCOSE 105*  BUN 13  CREATININE 1.18  CALCIUM 8.4*   PT/INR No results for input(s): LABPROT, INR in the last 72 hours. ABG No results for input(s): PHART, HCO3 in the last 72 hours.  Invalid input(s): PCO2, PO2  Studies/Results: Dg Chest 2 View  Result Date: 09/04/2017 CLINICAL DATA:  Shortness of breath due to abdominal fullness. Patient was released from the hospital yesterday after radical prostatectomy. EXAM: CHEST  2 VIEW COMPARISON:  07/10/2012 FINDINGS: Shallow inspiration with linear atelectasis in the lung bases. Calcified granuloma in the right lung base and left mid lung. Heart size and pulmonary vascularity are normal. No airspace disease in the lungs. No blunting of costophrenic angles. No pneumothorax. Mediastinal contours appear intact. Circumscribed cystic lesion in the right humeral head without expansile change, likely representing a degenerative cyst. Hypertrophic degenerative changes in the right shoulder. Possible degenerative cyst also seen in the left glenoid. IMPRESSION: Shallow inspiration with linear atelectasis in the lung bases. Electronically Signed   By: Lucienne Capers M.D.   On: 09/04/2017 18:53   Ct Abdomen Pelvis W Contrast  Result  Date: 09/04/2017 CLINICAL DATA:  Prostate surgery on Thursday. Chest released from hospital yesterday. Shortness of breath. EXAM: CT ABDOMEN AND PELVIS WITH CONTRAST TECHNIQUE: Multidetector CT imaging of the abdomen and pelvis was performed using the standard protocol  following bolus administration of intravenous contrast. CONTRAST:  100 mL Isovue-300 COMPARISON:  CT pelvis 07/16/2017. CT chest abdomen and pelvis 07/05/2012 FINDINGS: Lower chest: Atelectasis in the lung bases. Calcified granulomas in the left lung with calcified hilar lymph nodes bilaterally. Coronary artery calcifications. Hepatobiliary: No focal liver abnormality is seen. No gallstones, gallbladder wall thickening, or biliary dilatation. Pancreas: Unremarkable. No pancreatic ductal dilatation or surrounding inflammatory changes. Spleen: Normal in size without focal abnormality. Adrenals/Urinary Tract: No adrenal gland nodules. Small bilateral renal parenchymal cysts. Nephrograms are homogeneous. No hydronephrosis or hydroureter. The bladder is decompressed with a Foley catheter in place. Bladder wall thickening is likely due to hypertrophy and under distention. Focal increased attenuation in the prostate bed is present on early and delayed studies, likely representing surgical clip or suture material. No definite evidence of contrast extravasation from the bladder. Stomach/Bowel: Stomach, small bowel, stomach and small bowel are mostly decompressed. Scattered stool throughout the colon without colonic distention. No wall thickening or inflammatory changes are suggested. Appendix appears to be surgically absent. Mild infiltration in the presacral fat is likely postoperative. Vascular/Lymphatic: Aortic atherosclerosis. No enlarged abdominal or pelvic lymph nodes. Reproductive: Prostate gland is surgically absent. Other: Postoperative changes consistent with mesh repair of a right inguinal hernia. No evidence of residual or recurrent hernia. Minimal subcutaneous gas demonstrated in the anterior abdominal wall, likely postoperative. No free intraperitoneal air is appreciated. Musculoskeletal: Degenerative changes of the lumbosacral interspace. No destructive or expansile bone lesions are identified. Probable benign  bone island in the right sacrum. IMPRESSION: 1. Postoperative changes with prostate resection. Foley catheter decompresses the bladder. Small amount of edema or stranding in the presacral space is likely postoperative. Small amount of subcutaneous gas is also likely postoperative. 2. Previous right inguinal hernia repair without recurrence. 3. No small or large bowel distention or inflammatory change. 4. Atelectasis in the lung bases. 5. Aortic atherosclerosis. Electronically Signed   By: Lucienne Capers M.D.   On: 09/04/2017 20:17    Anti-infectives: Anti-infectives (From admission, onward)   None      Assessment/Plan:  1 - Post-Op Ileus / Abdominal Pain - likely source of abdominal pain. Imaging, labs, exam, history, otherwise reassuring. Rec  non-narcotic pain meds, ambulation, gentle bowel regimen.   Remain in house for now, likely DC today PM v. AM tomorrow.   2 - Prostate Cancer - no further cancer directed  Therapy this admission, path pending.      [facility], Charistopher Rumble 09/05/2017

## 2017-09-06 LAB — URINE CULTURE: Culture: NO GROWTH

## 2017-09-06 NOTE — Discharge Summary (Signed)
  Date of admission: 09/04/2017  Date of discharge: 09/06/2017  Admission diagnosis: Prostate cancer, abdominal pain  Discharge diagnosis: Prostate cancer, postoperative ileus  History and Physical: For full details, please see admission history and physical. Briefly, Johnathan Sosa is a 72 y.o. year old patient with prostate cancer s/p robotic prostatectomy last Thursday.  He developed severe, uncontrolled abdominal pain Saturday night and was admitted for observation.    Hospital Course: CT imaging of the abd/pelvis on Saturday was unremarkable.  He was admitted for pain control and was felt to likely have a postoperative ileus.  He had a BM Sunday night and his pain was improved with adequate bowel function returned by Monday.  Laboratory values:  Recent Labs    09/04/17 1825  HGB 12.7*  HCT 37.1*   Recent Labs    09/04/17 1825  CREATININE 1.18    Disposition: Home  Discharge instruction: The patient was instructed to be ambulatory but told to refrain from heavy lifting, strenuous activity, or driving.   Discharge medications:  Allergies as of 09/06/2017      Reactions   Black Pepper [piper] Swelling   redness of skin, profuse sweating   Ritalin [methylphenidate Hcl] Other (See Comments)   Joint's ache.   Sulfa Antibiotics Itching, Rash      Medication List    TAKE these medications   acetaminophen 500 MG tablet Commonly known as:  TYLENOL Take 500 mg by mouth every 8 (eight) hours as needed for mild pain or headache.   allopurinol 300 MG tablet Commonly known as:  ZYLOPRIM Take 300 mg by mouth daily.   ALPRAZolam 0.25 MG tablet Commonly known as:  XANAX Prn use for VERTIGO po, do not exceed 2 a day.   amLODipine 2.5 MG tablet Commonly known as:  NORVASC Take 1 tablet (2.5 mg total) by mouth daily.   atorvastatin 10 MG tablet Commonly known as:  LIPITOR Take 1 tablet (10 mg total) by mouth daily. What changed:  when to take this   buPROPion 300 MG 24 hr  tablet Commonly known as:  WELLBUTRIN XL Take 300 mg by mouth daily.   ciprofloxacin 500 MG tablet Commonly known as:  CIPRO Take 1 tablet (500 mg total) by mouth 2 (two) times daily. Start day prior to office visit for foley removal   clonazePAM 1 MG tablet Commonly known as:  KLONOPIN Take 1 tablet (1 mg total) by mouth at bedtime. Take 1 tablet at bedtime PRN for sleep What changed:  additional instructions   docusate sodium 100 MG capsule Commonly known as:  COLACE Take 100 mg by mouth 2 (two) times daily.   HYDROcodone-acetaminophen 5-325 MG tablet Commonly known as:  NORCO Take 1-2 tablets by mouth every 6 (six) hours as needed.       Followup: He will keep his scheduled appt for Wednesday for catheter removal.

## 2017-09-06 NOTE — Progress Notes (Signed)
Patient ID: Johnathan Sosa, male   DOB: 26-Sep-1945, 72 y.o.   MRN: [id number]    Subjective: Pt admitted Saturday for abdominal pain.  Imaging and exam/history suggested probable post-operative ileus.  He had a BM last night and today feels much improved.  Objective: Vital signs in last 24 hours: Temp:  [98 F (36.7 C)-98.8 F (37.1 C)] 98 F (36.7 C) (01/07 0527) Pulse Rate:  [83-93] 83 (01/07 0527) Resp:  [18] 18 (01/07 0527) BP: (119-146)/(65-96) 119/65 (01/07 0527) SpO2:  [96 %-97 %] 97 % (01/07 0527)  Intake/Output from previous day: 01/06 0701 - 01/07 0700 In: 1490 [P.O.:240; I.V.:1250] Out: 2275 [Urine:2275] Intake/Output this shift: No intake/output data recorded.  Physical Exam:  General: Alert and oriented Abdomen: Soft, ND, Normal bowel sounds, NT  Lab Results: Recent Labs    09/04/17 1825  HGB 12.7*  HCT 37.1*   BMET Recent Labs    09/04/17 1825  NA 137  K 3.8  CL 107  CO2 23  GLUCOSE 105*  BUN 13  CREATININE 1.18  CALCIUM 8.4*     Studies/Results: Dg Chest 2 View  Result Date: 09/04/2017 CLINICAL DATA:  Shortness of breath due to abdominal fullness. Patient was released from the hospital yesterday after radical prostatectomy. EXAM: CHEST  2 VIEW COMPARISON:  07/10/2012 FINDINGS: Shallow inspiration with linear atelectasis in the lung bases. Calcified granuloma in the right lung base and left mid lung. Heart size and pulmonary vascularity are normal. No airspace disease in the lungs. No blunting of costophrenic angles. No pneumothorax. Mediastinal contours appear intact. Circumscribed cystic lesion in the right humeral head without expansile change, likely representing a degenerative cyst. Hypertrophic degenerative changes in the right shoulder. Possible degenerative cyst also seen in the left glenoid. IMPRESSION: Shallow inspiration with linear atelectasis in the lung bases. Electronically Signed   By: Lucienne Capers M.D.   On: 09/04/2017 18:53   Ct  Abdomen Pelvis W Contrast  Result Date: 09/04/2017 CLINICAL DATA:  Prostate surgery on Thursday. Chest released from hospital yesterday. Shortness of breath. EXAM: CT ABDOMEN AND PELVIS WITH CONTRAST TECHNIQUE: Multidetector CT imaging of the abdomen and pelvis was performed using the standard protocol following bolus administration of intravenous contrast. CONTRAST:  100 mL Isovue-300 COMPARISON:  CT pelvis 07/16/2017. CT chest abdomen and pelvis 07/05/2012 FINDINGS: Lower chest: Atelectasis in the lung bases. Calcified granulomas in the left lung with calcified hilar lymph nodes bilaterally. Coronary artery calcifications. Hepatobiliary: No focal liver abnormality is seen. No gallstones, gallbladder wall thickening, or biliary dilatation. Pancreas: Unremarkable. No pancreatic ductal dilatation or surrounding inflammatory changes. Spleen: Normal in size without focal abnormality. Adrenals/Urinary Tract: No adrenal gland nodules. Small bilateral renal parenchymal cysts. Nephrograms are homogeneous. No hydronephrosis or hydroureter. The bladder is decompressed with a Foley catheter in place. Bladder wall thickening is likely due to hypertrophy and under distention. Focal increased attenuation in the prostate bed is present on early and delayed studies, likely representing surgical clip or suture material. No definite evidence of contrast extravasation from the bladder. Stomach/Bowel: Stomach, small bowel, stomach and small bowel are mostly decompressed. Scattered stool throughout the colon without colonic distention. No wall thickening or inflammatory changes are suggested. Appendix appears to be surgically absent. Mild infiltration in the presacral fat is likely postoperative. Vascular/Lymphatic: Aortic atherosclerosis. No enlarged abdominal or pelvic lymph nodes. Reproductive: Prostate gland is surgically absent. Other: Postoperative changes consistent with mesh repair of a right inguinal hernia. No evidence of  residual or recurrent hernia. Minimal  subcutaneous gas demonstrated in the anterior abdominal wall, likely postoperative. No free intraperitoneal air is appreciated. Musculoskeletal: Degenerative changes of the lumbosacral interspace. No destructive or expansile bone lesions are identified. Probable benign bone island in the right sacrum. IMPRESSION: 1. Postoperative changes with prostate resection. Foley catheter decompresses the bladder. Small amount of edema or stranding in the presacral space is likely postoperative. Small amount of subcutaneous gas is also likely postoperative. 2. Previous right inguinal hernia repair without recurrence. 3. No small or large bowel distention or inflammatory change. 4. Atelectasis in the lung bases. 5. Aortic atherosclerosis. Electronically Signed   By: Lucienne Capers M.D.   On: 09/04/2017 20:17    Assessment/Plan: Resolved post-op ileus - D/C home   LOS: 0 days   Emmanuel Ercole,LES 09/06/2017, 11:45 AM

## 2017-09-06 NOTE — Care Management Note (Signed)
Case Management Note  Patient Details  Name: Johnathan Sosa MRN: [id number] Date of Birth: 04-05-1946  Subjective/Objective: 72 y/o m admitted w/ileus. From home.                   Action/Plan:d/c home.   Expected Discharge Date:  09/06/17               Expected Discharge Plan:  Home/Self Care  In-House Referral:     Discharge planning Services  CM Consult  Post Acute Care Choice:    Choice offered to:     DME Arranged:    DME Agency:     HH Arranged:    HH Agency:     Status of Service:  Completed, signed off  If discussed at [employer] of Stay Meetings, dates discussed:    Additional Comments:  Dessa Phi, RN 09/06/2017, 12:35 PM

## 2017-09-07 ENCOUNTER — Encounter (HOSPITAL_COMMUNITY): Payer: Self-pay | Admitting: Urology

## 2017-09-07 ENCOUNTER — Telehealth: Payer: Self-pay | Admitting: *Deleted

## 2017-09-07 NOTE — Telephone Encounter (Signed)
Left message requesting call back to schedule hospital follow up appointment.

## 2017-09-09 ENCOUNTER — Encounter: Payer: Self-pay | Admitting: Neurology

## 2017-09-09 ENCOUNTER — Ambulatory Visit: Payer: Medicare HMO | Admitting: Neurology

## 2017-09-09 VITALS — BP 127/84 | HR 85 | Ht 73.0 in | Wt 209.0 lb

## 2017-09-09 DIAGNOSIS — G3184 Mild cognitive impairment, so stated: Secondary | ICD-10-CM | POA: Diagnosis not present

## 2017-09-09 NOTE — Patient Instructions (Signed)
Mild Neurocognitive Disorder Mild neurocognitive disorder (formerly known as mild cognitive impairment) is a mental disorder. It is a slight abnormal decrease in mental function. The areas of mental function affected may include memory, thought, communication, behavior, and completion of tasks. The decrease is noticeable and measurable but for the most part does not interfere with your daily activities. Mild neurocognitive disorder typically occurs in people older than 60 years but can occur earlier. It is not as serious as major neurocognitive disorder (formerly known as dementia) but may lead to a more serious neurocognitive disorder.  However, in some cases the condition does not get worse.  Some people with this disorder will improve. What are the causes? There are a number of different causes of mild neurocognitive disorder:  Brain disorders associated with abnormal protein deposits, such as Alzheimer's disease, Pick's disease, and Lewy body disease.  Brain disorders associated with abnormal movement, such as Parkinson's disease and Huntington's disease.  Diseases affecting blood vessels in the brain and resulting in mini-strokes.  Certain infections, such as human immunodeficiency virus (HIV) infection.  Traumatic brain injury.  Other medical conditions such as brain tumors, underactive thyroid (hypothyroidism), and vitamin B12 deficiency.  Use of certain prescription medicine and "recreational" drugs.  What are the signs or symptoms? Symptoms of mild neurocognitive disorder include:  Difficulty remembering. You may forget details of recent events, names, or phone numbers. You may forget important social events and appointments or repeatedly forget where you put your car keys.  Difficulty thinking and solving problems. You may have trouble with complex tasks such as paying bills or driving in unfamiliar locations.  Difficulty communicating. You may have trouble finding the right  word, naming an object, forming a sentence that makes sense, or understanding what you read or hear.  Changes in your behavior or personality. You may lose interest in the things that you used to enjoy or withdraw from social situations. You may get angry more easily than usual. You may act before thinking. You may do things in public that you would not usually do. You may hear or see things that are not real (hallucinations). You may believe falsely that others are trying to hurt you (paranoia).  How is this diagnosed? Mild neurocognitive disorder is diagnosed through an assessment by your health care provider. Your health care provider will ask you and your family, friends, or coworkers questions about your symptoms. He or she will ask how often the symptoms occur, how long they have been occurring, whether they are getting worse, and the effect they are having on your life. Your health care provider may refer you to a neurologist or mental health specialist for a detailed evaluation of your mental functions (neuropsychological testing). To identify the cause of your mild neurocognitive disorder, your health care provider may:  Obtain a detailed medical history.  Ask about alcohol and drug use, including prescription medicine.  Perform a physical exam.  Order blood tests and brain imaging exams.  How is this treated? Mild neurocognitive disorder caused by infections, use of certain medicines or "recreational" drugs, and certain medical conditions may improve with treatment of the condition that is causing the disorder. Mild neurocognitive disorder resulting from other causes generally does not improve and may worsen. In these cases, the goal of treatment is to slow progression of the disorder and help you cope with the loss of mental function. Treatments in these cases include:  Medicine. Medicine helps mainly with memory loss and behavioral symptoms.  Talk  therapy. Talk therapy provides  education, emotional support, memory aids, and other ways of making up for decreases in mental function.  Lifestyle changes. These include regular exercise, a healthy diet (including essential omega-3 fatty acids), intellectual stimulation, and increased social interaction.  This information is not intended to replace advice given to you by your health care provider. Make sure you discuss any questions you have with your health care provider. Document Released: 04/19/2013 Document Revised: 01/23/2016 Document Reviewed: 01/09/2013 Elsevier Interactive Patient Education  2017 [employer].
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
## 2017-09-13 ENCOUNTER — Encounter: Payer: Medicare HMO | Admitting: Psychology

## 2017-09-30 ENCOUNTER — Encounter: Payer: Medicare HMO | Admitting: Psychology

## 2017-10-01 DIAGNOSIS — M62838 Other muscle spasm: Secondary | ICD-10-CM | POA: Diagnosis not present

## 2017-10-01 DIAGNOSIS — M6281 Muscle weakness (generalized): Secondary | ICD-10-CM | POA: Diagnosis not present

## 2017-10-01 DIAGNOSIS — C61 Malignant neoplasm of prostate: Secondary | ICD-10-CM | POA: Diagnosis not present

## 2017-10-01 DIAGNOSIS — N393 Stress incontinence (female) (male): Secondary | ICD-10-CM | POA: Diagnosis not present
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
## 2017-10-07 ENCOUNTER — Ambulatory Visit (INDEPENDENT_AMBULATORY_CARE_PROVIDER_SITE_OTHER): Payer: Medicare HMO | Admitting: Family Medicine

## 2017-10-07 ENCOUNTER — Ambulatory Visit (INDEPENDENT_AMBULATORY_CARE_PROVIDER_SITE_OTHER): Payer: Medicare HMO | Admitting: *Deleted

## 2017-10-07 ENCOUNTER — Encounter: Payer: Self-pay | Admitting: Family Medicine

## 2017-10-07 ENCOUNTER — Encounter: Payer: Self-pay | Admitting: *Deleted

## 2017-10-07 VITALS — BP 120/78 | HR 87 | Temp 98.6°F | Ht 73.0 in | Wt 201.0 lb

## 2017-10-07 VITALS — BP 120/78 | HR 87 | Ht 73.0 in | Wt 201.0 lb

## 2017-10-07 DIAGNOSIS — Z0000 Encounter for general adult medical examination without abnormal findings: Secondary | ICD-10-CM | POA: Diagnosis not present

## 2017-10-07 DIAGNOSIS — F324 Major depressive disorder, single episode, in partial remission: Secondary | ICD-10-CM | POA: Diagnosis not present

## 2017-10-07 DIAGNOSIS — E785 Hyperlipidemia, unspecified: Secondary | ICD-10-CM | POA: Diagnosis not present

## 2017-10-07 DIAGNOSIS — M10079 Idiopathic gout, unspecified ankle and foot: Secondary | ICD-10-CM | POA: Diagnosis not present

## 2017-10-07 DIAGNOSIS — I10 Essential (primary) hypertension: Secondary | ICD-10-CM

## 2017-10-07 DIAGNOSIS — C61 Malignant neoplasm of prostate: Secondary | ICD-10-CM | POA: Diagnosis not present

## 2017-10-07 MED ORDER — CLONAZEPAM 1 MG PO TABS: 1.0000 mg | ORAL_TABLET | Freq: Every evening | ORAL | 1 refills | Status: DC | PRN

## 2017-10-07 MED ORDER — ALLOPURINOL 300 MG PO TABS: 300.0000 mg | ORAL_TABLET | Freq: Every day | ORAL | 3 refills | Status: DC

## 2017-10-07 NOTE — Assessment & Plan Note (Signed)
HLD- goal LDL under 70- was to restart atorvastatin 10mg  at last visit. Update lipids

## 2017-10-07 NOTE — Patient Instructions (Addendum)
Johnathan Sosa , Thank you for taking time to come for your Medicare Wellness Visit. I appreciate your ongoing commitment to your health goals. Please review the following plan we discussed and let me know if I can assist you in the future.   Tetanus was updated Will try to complete colonoscopy this year   These are the goals we discussed: Goals    . Patient Stated     Recover from his recent surgery Control of bladder        This is a list of the screening recommended for you and due dates:  Health Maintenance  Topic Date Due  . Colon Cancer Screening  03/15/2017  . Tetanus Vaccine  02/04/2025  . Flu Shot  Completed  .  Hepatitis C: One time screening is recommended by Center for Disease Control  (CDC) for  adults born from [birth year] through 1963.   Completed  . Pneumonia vaccines  Completed    Health Maintenance, Male A healthy lifestyle and preventive care is important for your health and wellness. Ask your health care provider about what schedule of regular examinations is right for you. What should I know about weight and diet? Eat a Healthy Diet  Eat plenty of vegetables, fruits, whole grains, low-fat dairy products, and lean protein.  Do not eat a lot of foods high in solid fats, added sugars, or salt.  Maintain a Healthy Weight Regular exercise can help you achieve or maintain a healthy weight. You should:  Do at least 150 minutes of exercise each week. The exercise should increase your heart rate and make you sweat (moderate-intensity exercise).  Do strength-training exercises at least twice a week.  Watch Your Levels of Cholesterol and Blood Lipids  Have your blood tested for lipids and cholesterol every 5 years starting at 70 years of age. If you are at high risk for heart disease, you should start having your blood tested when you are 70 years old. You may need to have your cholesterol levels checked more often if: ? Your lipid or cholesterol levels are high. ? You are  older than 70 years of age. ? You are at high risk for heart disease.  What should I know about cancer screening? Many types of cancers can be detected early and may often be prevented. Lung Cancer  You should be screened every year for lung cancer if: ? You are a current smoker who has smoked for at least 30 years. ? You are a former smoker who has quit within the past 15 years.  Talk to your health care provider about your screening options, when you should start screening, and how often you should be screened.  Colorectal Cancer  Routine colorectal cancer screening usually begins at 70 years of age and should be repeated every 5-10 years until you are 70 years old. You may need to be screened more often if early forms of precancerous polyps or small growths are found. Your health care provider may recommend screening at an earlier age if you have risk factors for colon cancer.  Your health care provider may recommend using home test kits to check for hidden blood in the stool.  A small camera at the end of a tube can be used to examine your colon (sigmoidoscopy or colonoscopy). This checks for the earliest forms of colorectal cancer.  Prostate and Testicular Cancer  Depending on your age and overall health, your health care provider may do certain tests to screen for prostate  and testicular cancer.  Talk to your health care provider about any symptoms or concerns you have about testicular or prostate cancer.  Skin Cancer  Check your skin from head to toe regularly.  Tell your health care provider about any new moles or changes in moles, especially if: ? There is a change in a mole's size, shape, or color. ? You have a mole that is larger than a pencil eraser.  Always use sunscreen. Apply sunscreen liberally and repeat throughout the day.  Protect yourself by wearing long sleeves, pants, a wide-brimmed hat, and sunglasses when outside.  What should I know about heart disease,  diabetes, and high blood pressure?  If you are 30-11 years of age, have your blood pressure checked every 3-5 years. If you are 65 years of age or older, have your blood pressure checked every year. You should have your blood pressure measured twice-once when you are at a hospital or clinic, and once when you are not at a hospital or clinic. Record the average of the two measurements. To check your blood pressure when you are not at a hospital or clinic, you can use: ? An automated blood pressure machine at a pharmacy. ? A home blood pressure monitor.  Talk to your health care provider about your target blood pressure.  If you are between 8-81 years old, ask your health care provider if you should take aspirin to prevent heart disease.  Have regular diabetes screenings by checking your fasting blood sugar level. ? If you are at a normal weight and have a low risk for diabetes, have this test once every three years after the age of 64. ? If you are overweight and have a high risk for diabetes, consider being tested at a younger age or more often.  A one-time screening for abdominal aortic aneurysm (AAA) by ultrasound is recommended for men aged 44-75 years who are current or former smokers. What should I know about preventing infection? Hepatitis B If you have a higher risk for hepatitis B, you should be screened for this virus. Talk with your health care provider to find out if you are at risk for hepatitis B infection. Hepatitis C Blood testing is recommended for:  Everyone born from [birth year] through 1963.  Anyone with known risk factors for hepatitis C.  Sexually Transmitted Diseases (STDs)  You should be screened each year for STDs including gonorrhea and chlamydia if: ? You are sexually active and are younger than 70 years of age. ? You are older than 70 years of age and your health care provider tells you that you are at risk for this type of infection. ? Your sexual activity has  changed since you were last screened and you are at an increased risk for chlamydia or gonorrhea. Ask your health care provider if you are at risk.  Talk with your health care provider about whether you are at high risk of being infected with HIV. Your health care provider may recommend a prescription medicine to help prevent HIV infection.  What else can I do?  Schedule regular health, dental, and eye exams.  Stay current with your vaccines (immunizations).  Do not use any tobacco products, such as cigarettes, chewing tobacco, and e-cigarettes. If you need help quitting, ask your health care provider.  Limit alcohol intake to no more than 2 drinks per day. One drink equals 12 ounces of beer, 5 ounces of wine, or 1 ounces of hard liquor.  Do not use  street drugs.  Do not share needles.  Ask your health care provider for help if you need support or information about quitting drugs.  Tell your health care provider if you often feel depressed.  Tell your health care provider if you have ever been abused or do not feel safe at home. This information is not intended to replace advice given to you by your health care provider. Make sure you discuss any questions you have with your health care provider. Document Released: 02/13/2008 Document Revised: 04/15/2016 Document Reviewed: 05/21/2015 Elsevier Interactive Patient Education  2018 [place] in the Home Falls can cause injuries and can affect people from all age groups. There are many simple things that you can do to make your home safe and to help prevent falls. What can I do on the outside of my home?  Regularly repair the edges of walkways and driveways and fix any cracks.  Remove high doorway thresholds.  Trim any shrubbery on the main path into your home.  Use bright outdoor lighting.  Clear walkways of debris and clutter, including tools and rocks.  Regularly check that handrails are securely  fastened and in good repair. Both sides of any steps should have handrails.  Install guardrails along the edges of any raised decks or porches.  Have leaves, snow, and ice cleared regularly.  Use sand or salt on walkways during winter months.  In the garage, clean up any spills right away, including grease or oil spills. What can I do in the bathroom?  Use night lights.  Install grab bars by the toilet and in the tub and shower. Do not use towel bars as grab bars.  Use non-skid mats or decals on the floor of the tub or shower.  If you need to sit down while you are in the shower, use a plastic, non-slip stool.  Keep the floor dry. Immediately clean up any water that spills on the floor.  Remove soap buildup in the tub or shower on a regular basis.  Attach bath mats securely with double-sided non-slip rug tape.  Remove throw rugs and other tripping hazards from the floor. What can I do in the bedroom?  Use night lights.  Make sure that a bedside light is easy to reach.  Do not use oversized bedding that drapes onto the floor.  Have a firm chair that has side arms to use for getting dressed.  Remove throw rugs and other tripping hazards from the floor. What can I do in the kitchen?  Clean up any spills right away.  Avoid walking on wet floors.  Place frequently used items in easy-to-reach places.  If you need to reach for something above you, use a sturdy step stool that has a grab bar.  Keep electrical cables out of the way.  Do not use floor polish or wax that makes floors slippery. If you have to use wax, make sure that it is non-skid floor wax.  Remove throw rugs and other tripping hazards from the floor. What can I do in the stairways?  Do not leave any items on the stairs.  Make sure that there are handrails on both sides of the stairs. Fix handrails that are broken or loose. Make sure that handrails are as long as the stairways.  Check any carpeting to  make sure that it is firmly attached to the stairs. Fix any carpet that is loose or worn.  Avoid having throw rugs  at the top or bottom of stairways, or secure the rugs with carpet tape to prevent them from moving.  Make sure that you have a light switch at the top of the stairs and the bottom of the stairs. If you do not have them, have them installed. What are some other fall prevention tips?  Wear closed-toe shoes that fit well and support your feet. Wear shoes that have rubber soles or low heels.  When you use a stepladder, make sure that it is completely opened and that the sides are firmly locked. Have someone hold the ladder while you are using it. Do not climb a closed stepladder.  Add color or contrast paint or tape to grab bars and handrails in your home. Place contrasting color strips on the first and last steps.  Use mobility aids as needed, such as canes, walkers, scooters, and crutches.  Turn on lights if it is dark. Replace any light bulbs that burn out.  Set up furniture so that there are clear paths. Keep the furniture in the same spot.  Fix any uneven floor surfaces.  Choose a carpet design that does not hide the edge of steps of a stairway.  Be aware of any and all pets.  Review your medicines with your healthcare provider. Some medicines can cause dizziness or changes in blood pressure, which increase your risk of falling. Talk with your health care provider about other ways that you can decrease your risk of falls. This may include working with a physical therapist or trainer to improve your strength, balance, and endurance. This information is not intended to replace advice given to you by your health care provider. Make sure you discuss any questions you have with your health care provider. Document Released: 08/07/2002 Document Revised: 01/14/2016 Document Reviewed: 09/21/2014 Elsevier Interactive Patient Education  [employer].

## 2017-10-07 NOTE — Assessment & Plan Note (Signed)
S/p prostatectomy January 2019

## 2017-10-07 NOTE — Patient Instructions (Signed)
4-6 months follow up  No changes today  Schedule a lab visit at the check out desk within 2 weeks. Return for future fasting labs meaning nothing but water after midnight please. Ok to take your medications with water.
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
## 2017-10-07 NOTE — Assessment & Plan Note (Signed)
HTN- controlled on amlodipine - he is not sure if he is on the 2.5 or 5mg  dose today- was to cut down to 2.5mg .

## 2017-10-07 NOTE — Progress Notes (Signed)
I have reviewed and agree with note, evaluation, plan.   Rich Paprocki, MD  

## 2017-10-07 NOTE — Assessment & Plan Note (Signed)
Depression- taking 300mg  of wellbutrin. Mild poor control- he declines counseling or changes in medication.  Depression screen [facility] 2/9 10/16/2016 10/01/2015 12/26/2014  Decreased Interest 1 0 2  Down, Depressed, Hopeless 2 0 1  PHQ - 2 Score 3 0 3  Altered sleeping 2 - 1  Tired, decreased energy 2 - 2  Change in appetite 2 - 1  Feeling bad or failure about yourself  0 - 1  Trouble concentrating 0 - 2  Moving slowly or fidgety/restless 0 - 1  Suicidal thoughts 0 - 0  PHQ-9 Score 9 - 11  Difficult doing work/chores - - Not difficult at all

## 2017-10-08 ENCOUNTER — Other Ambulatory Visit (INDEPENDENT_AMBULATORY_CARE_PROVIDER_SITE_OTHER): Payer: Medicare HMO

## 2017-10-08 DIAGNOSIS — E785 Hyperlipidemia, unspecified: Secondary | ICD-10-CM

## 2017-10-08 DIAGNOSIS — M10079 Idiopathic gout, unspecified ankle and foot: Secondary | ICD-10-CM

## 2017-10-08 LAB — LIPID PANEL
CHOL/HDL RATIO: 4
Cholesterol: 124 mg/dL (ref 0–200)
HDL: 29.6 mg/dL — ABNORMAL LOW (ref >39.00)
LDL Cholesterol: 71 mg/dL (ref 0–99)
NONHDL: 94.74
TRIGLYCERIDES: 119 mg/dL (ref 0.0–149.0)
VLDL: 23.8 mg/dL (ref 0.0–40.0)

## 2017-10-08 LAB — COMPREHENSIVE METABOLIC PANEL
ALT: 23 U/L (ref 0–53)
AST: 14 U/L (ref 0–37)
Albumin: 4.2 g/dL (ref 3.5–5.2)
Alkaline Phosphatase: 54 U/L (ref 39–117)
BILIRUBIN TOTAL: 0.9 mg/dL (ref 0.2–1.2)
BUN: 25 mg/dL — AB (ref 6–23)
CO2: 28 meq/L (ref 19–32)
CREATININE: 1.21 mg/dL (ref 0.40–1.50)
Calcium: 9.4 mg/dL (ref 8.4–10.5)
Chloride: 106 mEq/L (ref 96–112)
GFR: 63.12 mL/min (ref >60.00)
GLUCOSE: 113 mg/dL — AB (ref 70–99)
Potassium: 4 mEq/L (ref 3.5–5.1)
Sodium: 140 mEq/L (ref 135–145)
Total Protein: 6.7 g/dL (ref 6.0–8.3)

## 2017-10-08 LAB — CBC
HEMATOCRIT: 43.3 % (ref 38.5–50.0)
HEMOGLOBIN: 15.1 g/dL (ref 13.2–17.1)
MCH: 30 pg (ref 27.0–33.0)
MCHC: 34.9 g/dL (ref 32.0–36.0)
MCV: 85.9 fL (ref 80.0–100.0)
MPV: 10.3 fL (ref 7.5–12.5)
Platelets: 223 10*3/uL (ref 140–400)
RBC: 5.04 10*6/uL (ref 4.20–5.80)
RDW: 12.7 % (ref 11.0–15.0)
WBC: 8.2 10*3/uL (ref 3.8–10.8)

## 2017-10-08 LAB — URIC ACID: Uric Acid, Serum: 5.5 mg/dL (ref 4.0–7.8)

## 2017-10-08 NOTE — Addendum Note (Signed)
Addended by: Frutoso Chase A on: 10/08/2017 11:57 AM   Modules accepted: Orders

## 2017-10-11 ENCOUNTER — Telehealth: Payer: Self-pay | Admitting: Family Medicine

## 2017-10-11 ENCOUNTER — Encounter: Payer: Self-pay | Admitting: Psychology

## 2017-10-11 ENCOUNTER — Ambulatory Visit: Payer: Medicare HMO | Admitting: Psychology

## 2017-10-11 ENCOUNTER — Encounter: Payer: Medicare HMO | Admitting: Psychology

## 2017-10-11 DIAGNOSIS — R4689 Other symptoms and signs involving appearance and behavior: Secondary | ICD-10-CM

## 2017-10-11 DIAGNOSIS — S060X1S Concussion with loss of consciousness of 30 minutes or less, sequela: Secondary | ICD-10-CM

## 2017-10-11 DIAGNOSIS — R4189 Other symptoms and signs involving cognitive functions and awareness: Secondary | ICD-10-CM

## 2017-10-11 DIAGNOSIS — R413 Other amnesia: Secondary | ICD-10-CM

## 2017-10-11 NOTE — Progress Notes (Signed)
   Neuropsychology Note  Johnathan Sosa completed 90 minutes of neuropsychological testing with technician, Milana Kidney, BS, under the supervision of Dr. Macarthur Critchley, Licensed Psychologist. The patient did not appear overtly distressed by the testing session, per behavioral observation or via self-report to the technician. Rest breaks were offered.   Clinical Decision Making: In considering the patient's current level of functioning, level of presumed impairment, nature of symptoms, emotional and behavioral responses during the interview, level of literacy, and observed level of motivation/effort, a battery of tests was selected and communicated to the psychometrician.  Communication between the psychologist and technician was ongoing throughout the testing session and changes were made as deemed necessary based on patient performance on testing, technician observations and additional pertinent factors such as those listed above.  Johnathan Sosa will return within approximately 2 weeks for an interactive feedback session with Dr. Si Raider at which time his test performances, clinical impressions and treatment recommendations will be reviewed in detail. The patient understands he can contact our office should he require our assistance before this time.  20 minutes spent performing neuropsychological evaluation services/clinical decision making (psychologist). [CPT 11657] 90 minutes spent face-to-face with patient administering standardized tests, 30 minutes spent scoring (technician). [CPT Y8200648, 38333]  Full report to follow.

## 2017-10-11 NOTE — Telephone Encounter (Signed)
Please advise.   Copied from [place]. Topic: Quick Communication - Lab Results >> Oct 11, 2017 11:07 AM Synthia Innocent wrote: Patient returning call in regards to lab work
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
## 2017-10-12 NOTE — Telephone Encounter (Signed)
Copied from [place] [phone]. Topic: General - Call Back - No Documentation >> Oct 12, 2017 12:58 PM Ahmed Prima L wrote: Please call [phone] [name])

## 2017-10-13 NOTE — Telephone Encounter (Signed)
Copied from [place]. Topic: General - Other >> Oct 13, 2017  9:31 AM Lolita Rieger, RMA wrote: Reason for CRM: Pt wife [name] has called again and is very upset that no one has called her back with her husbands results after multiple attempts to contact the office.Please call her with results @ [phone]

## 2017-10-13 NOTE — Telephone Encounter (Signed)
Patient was called by Anselmo Pickler on 10/09/17. Butch Penny left a voicemail message asking for a return phone call
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
## 2017-10-18 ENCOUNTER — Encounter: Payer: Self-pay | Admitting: Psychology

## 2017-10-18 ENCOUNTER — Ambulatory Visit (INDEPENDENT_AMBULATORY_CARE_PROVIDER_SITE_OTHER): Payer: Medicare HMO | Admitting: Psychology

## 2017-10-18 DIAGNOSIS — R4689 Other symptoms and signs involving appearance and behavior: Secondary | ICD-10-CM

## 2017-10-18 DIAGNOSIS — R413 Other amnesia: Secondary | ICD-10-CM

## 2017-10-18 DIAGNOSIS — R4189 Other symptoms and signs involving cognitive functions and awareness: Secondary | ICD-10-CM

## 2017-10-18 NOTE — Progress Notes (Signed)
I appreciate the assessment and plan as directed by Dr Bonita Quin.  His test results were interesting and leave me to refer to psychology/ counseling from here on.      Laelia Angelo, MD

## 2017-11-04 ENCOUNTER — Encounter: Payer: Medicare HMO | Admitting: Psychology

## 2017-11-11 NOTE — Telephone Encounter (Signed)
Error

## 2017-11-18 ENCOUNTER — Other Ambulatory Visit: Payer: Self-pay | Admitting: Family Medicine

## 2017-11-18 DIAGNOSIS — R42 Dizziness and giddiness: Secondary | ICD-10-CM

## 2017-11-18 DIAGNOSIS — S069XAA Unspecified intracranial injury with loss of consciousness status unknown, initial encounter: Secondary | ICD-10-CM

## 2017-11-18 DIAGNOSIS — S069X9A Unspecified intracranial injury with loss of consciousness of unspecified duration, initial encounter: Principal | ICD-10-CM

## 2017-11-26 DIAGNOSIS — C61 Malignant neoplasm of prostate: Secondary | ICD-10-CM | POA: Diagnosis not present

## 2017-11-26 LAB — PSA: PSA: 0.015

## 2017-12-02 ENCOUNTER — Encounter: Payer: Medicare HMO | Admitting: Psychology

## 2017-12-03 DIAGNOSIS — N393 Stress incontinence (female) (male): Secondary | ICD-10-CM | POA: Diagnosis not present

## 2017-12-03 DIAGNOSIS — N5201 Erectile dysfunction due to arterial insufficiency: Secondary | ICD-10-CM | POA: Diagnosis not present

## 2017-12-03 DIAGNOSIS — C61 Malignant neoplasm of prostate: Secondary | ICD-10-CM | POA: Diagnosis not present

## 2017-12-07 ENCOUNTER — Encounter: Payer: Self-pay | Admitting: Family Medicine

## 2017-12-10 ENCOUNTER — Other Ambulatory Visit: Payer: Self-pay

## 2017-12-10 ENCOUNTER — Telehealth: Payer: Self-pay | Admitting: Family Medicine

## 2017-12-10 MED ORDER — AMLODIPINE BESYLATE 2.5 MG PO TABS: 2.5000 mg | ORAL_TABLET | Freq: Every day | ORAL | 3 refills | Status: DC

## 2017-12-10 MED ORDER — ATORVASTATIN CALCIUM 10 MG PO TABS: 10.0000 mg | ORAL_TABLET | Freq: Every day | ORAL | 3 refills | Status: DC

## 2017-12-10 NOTE — Telephone Encounter (Signed)
Copied from [place] [phone]. Topic: Quick Communication - Rx Refill/Question >> Dec 10, 2017  2:37 PM Cleaster Corin, [place] wrote: Medication: buPROPion (WELLBUTRIN XL) 300 MG 24 hr tablet [[id number]]  atorvastatin (LIPITOR) 10 MG tablet [[id number]]  ALPRAZolam (XANAX) 0.25 MG tablet [[id number]]  amLODipine (NORVASC) 2.5 MG tablet [[id number]]  clonazePAM (KLONOPIN) 1 MG tablet [[id number]]  Has the patient contacted their pharmacy? yes (Agent: If no, request that the patient contact the pharmacy for the refill.) Preferred Pharmacy (with phone number or street name):  Agent: Please be advised that RX refills may take up to 3 business days. We ask that you follow-up with your pharmacy.

## 2017-12-10 NOTE — Telephone Encounter (Signed)
See refill request.

## 2017-12-10 NOTE — Telephone Encounter (Signed)
See note

## 2017-12-10 NOTE — Telephone Encounter (Signed)
Prescriptions refilled as requested 

## 2017-12-21 ENCOUNTER — Ambulatory Visit (INDEPENDENT_AMBULATORY_CARE_PROVIDER_SITE_OTHER): Payer: Medicare HMO | Admitting: Family Medicine

## 2017-12-21 ENCOUNTER — Encounter: Payer: Self-pay | Admitting: Family Medicine

## 2017-12-21 VITALS — BP 112/74 | HR 77 | Temp 98.5°F | Ht 73.0 in | Wt 193.0 lb

## 2017-12-21 DIAGNOSIS — F324 Major depressive disorder, single episode, in partial remission: Secondary | ICD-10-CM | POA: Diagnosis not present

## 2017-12-21 DIAGNOSIS — I700 Atherosclerosis of aorta: Secondary | ICD-10-CM

## 2017-12-21 DIAGNOSIS — R634 Abnormal weight loss: Secondary | ICD-10-CM | POA: Diagnosis not present

## 2017-12-21 NOTE — Assessment & Plan Note (Signed)
I suspect his weight loss and fatigue could strongly be contributed to by depression, the stress in his marriage, the fact wife is no longer in the home to prepare meals for him, the stress of his recent prostate cancer diagnosis and treatment.   He remains on Wellbutrin 300 mg.  He did not tolerate 450 mg and he has not tolerated SSRIs in the past. Has also had TCA trial in past.  We will add counseling.  He is  already set up with Dr. Lambert Mody- christian counselor

## 2017-12-21 NOTE — Patient Instructions (Signed)
Suspect weight loss is from stress of surgery and all that you have been having going on lately. Depression can cause weight loss- please fill out phq9 before you go. Please get plugged in with counseling. Continue wellbutrin  Lets get some labs to be on safe side. Pursue further workup as needed based on labs.

## 2017-12-21 NOTE — Progress Notes (Signed)
Subjective:  Johnathan Sosa is a 72 y.o. year old very pleasant male patient who presents for/with See problem oriented charting ROS-no chest pain reported.  Does report depressed mood.  Does have weight loss and fatigue.  No fever or night sweats  Past Medical History-  Patient Active Problem List   Diagnosis Date Noted  . Prostate cancer ([place]) 07/13/2017    Priority: High  . History of CVA (cerebrovascular accident) 07/20/2016    Priority: High  . Closed TBI (traumatic brain injury) ([place]) 10/24/2014    Priority: High  . 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 Attention deficit hyperactivity disorder (ADHD) 07/06/2016    Priority: Medium  . Gout     Priority: Medium  . Insomnia     Priority: Medium  . Hyperlipidemia     Priority: Medium  . Depression, major, single episode, in partial remission ([place])     Priority: Medium  . HTN (hypertension) 04/29/2012    Priority: Medium  . Former smoker 10/01/2015    Priority: Low  . Erectile dysfunction 10/01/2015    Priority: Low  . Migraine headache     Priority: Low  . Abnormal eye movements 10/24/2014    Priority: Low  . 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 GERD (gastroesophageal reflux disease)     Priority: Low  . Memory loss     Priority: Low  . Aortic atherosclerosis ([place]) 12/21/2017  . Unintentional weight loss 12/21/2017  . Ileus ([place]) 09/04/2017  . 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 BPPV (benign paroxysmal positional vertigo) 12/21/2016  . Screening for prostate cancer 10/16/2016  . Palpitations 07/20/2016    Medications- reviewed and updated Current Outpatient Medications  Medication Sig Dispense Refill  . allopurinol (ZYLOPRIM) 300 MG tablet Take 1 tablet (300 mg total) by mouth daily. 90 tablet 3  . ALPRAZolam (XANAX) 0.25 MG tablet TAKE BY MOUTH AS NEEDED FOR VERTIGO. DO NOT EXCEED 2 TABS A DAY. 90 tablet 0  . amLODipine (NORVASC)  2.5 MG tablet Take 1 tablet (2.5 mg total) by mouth daily. 90 tablet 3  . atorvastatin (LIPITOR) 10 MG tablet Take 1 tablet (10 mg total) by mouth daily. 90 tablet 3  . buPROPion (WELLBUTRIN XL) 300 MG 24 hr tablet Take 300 mg by mouth daily.    . clonazePAM (KLONOPIN) 1 MG tablet Take 1 tablet (1 mg total) by mouth at bedtime as needed. 90 tablet 1  . Diphenhydramine-APAP, sleep, (TYLENOL PM EXTRA STRENGTH PO) Take 2 tablets by mouth at bedtime.     No current facility-administered medications for this visit.     Objective: BP 112/74 (BP Location: Left Arm, Patient Position: Sitting, Cuff Size: Large)   Pulse 77   Temp 98.5 F (36.9 C) (Oral)   Ht 6\' 1"  (1.854 m)   Wt 193 lb (87.5 kg)   SpO2 97%   BMI 25.46 kg/m  Gen: NAD, appears fatigued. Thinner through face than normal CV: RRR no murmurs rubs or gallops Lungs: CTAB no crackles, wheeze, rhonchi Abdomen: soft/nontender/nondistended/normal bowel sounds.  Ext: no edema Skin: warm, dry Neuro: Normal gait and speech  Assessment/Plan:  Aortic atherosclerosis ([place]) Discussed importance of risk factor modification including blood pressure and lipids.  Depression, major, single episode, in partial remission ([place]) I suspect his weight loss and fatigue could strongly be contributed to by depression, the stress in his marriage, the fact wife  is no longer in the home to prepare meals for him, the stress of his recent prostate cancer diagnosis and treatment.   He remains on Wellbutrin 300 mg.  He did not tolerate 450 mg and he has not tolerated SSRIs in the past. Has also had TCA trial in past.  We will add counseling.  He is  already set up with Dr. Lambert Mody- christian counselor  Unintentional weight loss  Plan: CBC, Comprehensive metabolic panel, Hemoglobin A1c, TSH, Ambulatory referral to Gastroenterology, C-reactive protein, Sedimentation rate, Hepatitis C antibody, HIV antibody S:  Down 8 lbs in 2 months - appetite has  decreased and forgets to eat. Has tried to get some higher calories. Down 21 lbs in 5 months. 9% weight loss  In addition to weight loss, he is having significant fatigue. Gets worn out from afternoon. Falls asleep at drop of hat. Still wearing diaper from the surgery. Recently treated for prostate cancer- good margins and lymph nodes not involved. Had bone scan 07/2017 - "no definite scitigraphic evidence of osseous metastatic disease". CT abdomen/pelvis in January mainly showing postoperative changes and was noted to have aortic atherosclerosis.  He is past due for his colonoscopy- prostate surgery appropriately got in the way of this. Had CXR in January- reassuring and was already losing weight at that point.   Living out of 2 houses just in last week- some stresses in marriage- being in different houses seems to help the stressors but then again may worsen his mood. Wife was preparing a lot of meals.   Per wife- Occasional resting tremor or shaking in the hands while active- I dont see today and he does not discuss this.  Other notes- constipated and taking dulcolax. Cold compared to others. Worse than normal lately.  A/P: 72 year old male recovering after prostate cancer surgery which appeared to be successful-presenting with 9% weight loss over 5 months and fatigue.  Will begin workup with extensive labs.  He needs to get back in for his colonoscopy and we have referred today.   See depression section- We agreed 2-week follow-up.  Could consider CT chest but hopefully labs and next weight will direct [place] further     Future Appointments  Date Time Provider [place]  01/04/2018  1:00 PM Marin Olp, MD [facility]  03/17/2018  3:30 PM [name], Asencion Partridge, MD [facility] None   No follow-ups on file.  Lab/Order associations: Unintentional weight loss - Plan: CBC, Comprehensive metabolic panel, Hemoglobin A1c, TSH, Ambulatory referral to Gastroenterology, C-reactive protein,  Sedimentation rate, Hepatitis C antibody, HIV antibody, CANCELED: Hemoccult Cards (X3 cards)  Time Stamp The duration of face-to-face time during this visit was greater than 25 minutes. Greater than 50% of this time was spent in counseling, explanation of diagnosis, planning of further management, and/or coordination of care including discussion of my concern about weight loss velocity,  Depression as potential cause of symptoms, options for improving symptoms and further workup.   Return precautions advised.  [name], MD

## 2017-12-21 NOTE — Assessment & Plan Note (Signed)
Discussed importance of risk factor modification including blood pressure and lipids.

## 2017-12-21 NOTE — Assessment & Plan Note (Signed)
Plan: CBC, Comprehensive metabolic panel, Hemoglobin A1c, TSH, Ambulatory referral to Gastroenterology, C-reactive protein, Sedimentation rate, Hepatitis C antibody, HIV antibody S:  Down 8 lbs in 2 months - appetite has decreased and forgets to eat. Has tried to get some higher calories. Down 21 lbs in 5 months. 9% weight loss  In addition to weight loss, he is having significant fatigue. Gets worn out from afternoon. Falls asleep at drop of hat. Still wearing diaper from the surgery. Recently treated for prostate cancer- good margins and lymph nodes not involved. Had bone scan 07/2017 - "no definite scitigraphic evidence of osseous metastatic disease". CT abdomen/pelvis in January mainly showing postoperative changes and was noted to have aortic atherosclerosis.  He is past due for his colonoscopy- prostate surgery appropriately got in the way of this. Had CXR in January- reassuring and was already losing weight at that point.   Living out of 2 houses just in last week- some stresses in marriage- being in different houses seems to help the stressors but then again may worsen his mood. Wife was preparing a lot of meals.   Per wife- Occasional resting tremor or shaking in the hands while active- I dont see today and he does not discuss this.  Other notes- constipated and taking dulcolax. Cold compared to others. Worse than normal lately.  A/P: 72 year old male recovering after prostate cancer surgery which appeared to be successful-presenting with 9% weight loss over 5 months and fatigue.  Will begin workup with extensive labs.  He needs to get back in for his colonoscopy and we have referred today.   See depression section- We agreed 2-week follow-up.  Could consider CT chest but hopefully labs and next weight will direct [place] further

## 2017-12-22 ENCOUNTER — Encounter: Payer: Self-pay | Admitting: Gastroenterology

## 2017-12-22 LAB — CBC
HCT: 42.8 % (ref 39.0–52.0)
Hemoglobin: 14.9 g/dL (ref 13.0–17.0)
MCHC: 34.7 g/dL (ref 30.0–36.0)
MCV: 88.7 fl (ref 78.0–100.0)
Platelets: 222 10*3/uL (ref 150.0–400.0)
RBC: 4.83 Mil/uL (ref 4.22–5.81)
RDW: 13.4 % (ref 11.5–15.5)
WBC: 8 10*3/uL (ref 4.0–10.5)

## 2017-12-22 LAB — COMPREHENSIVE METABOLIC PANEL
ALT: 29 U/L (ref 0–53)
AST: 19 U/L (ref 0–37)
Albumin: 4 g/dL (ref 3.5–5.2)
Alkaline Phosphatase: 54 U/L (ref 39–117)
BILIRUBIN TOTAL: 0.4 mg/dL (ref 0.2–1.2)
BUN: 18 mg/dL (ref 6–23)
CO2: 26 mEq/L (ref 19–32)
CREATININE: 0.95 mg/dL (ref 0.40–1.50)
Calcium: 9.3 mg/dL (ref 8.4–10.5)
Chloride: 108 mEq/L (ref 96–112)
GFR: 83.39 mL/min (ref >60.00)
GLUCOSE: 88 mg/dL (ref 70–99)
Potassium: 4 mEq/L (ref 3.5–5.1)
SODIUM: 141 meq/L (ref 135–145)
Total Protein: 6.2 g/dL (ref 6.0–8.3)

## 2017-12-22 LAB — C-REACTIVE PROTEIN

## 2017-12-22 LAB — SEDIMENTATION RATE: Sed Rate: 3 mm/hr (ref 0–20)

## 2017-12-22 LAB — HEPATITIS C ANTIBODY
Hepatitis C Ab: NONREACTIVE
SIGNAL TO CUT-OFF: 0.01 (ref <1.00)

## 2017-12-22 LAB — TSH: TSH: 0.69 u[IU]/mL (ref 0.35–4.50)

## 2017-12-22 LAB — HEMOGLOBIN A1C: Hgb A1c MFr Bld: 5.5 % (ref 4.6–6.5)

## 2017-12-22 LAB — HIV ANTIBODY (ROUTINE TESTING W REFLEX): HIV 1&2 Ab, 4th Generation: NONREACTIVE

## 2017-12-22 NOTE — Progress Notes (Signed)
Low inflammation markers including CRP and sedimentation rate point away from cancer.  Hepatitis C is negative .  HIV negative.  Your thyroid was normal.No diabetes based on hemoglobin A1c being normal. Your CBC was normal (blood counts, infection fighting cells, platelets). Your CMET was normal (kidne low inflammatory markers y, liver, and electrolytes, blood sugar).  We still need to get a colonoscopy but overall results so far are reassuring

## 2018-01-04 ENCOUNTER — Ambulatory Visit: Payer: Medicare HMO | Admitting: Family Medicine

## 2018-01-05 ENCOUNTER — Ambulatory Visit (INDEPENDENT_AMBULATORY_CARE_PROVIDER_SITE_OTHER): Payer: Medicare HMO | Admitting: Family Medicine

## 2018-01-05 ENCOUNTER — Encounter: Payer: Self-pay | Admitting: Family Medicine

## 2018-01-05 VITALS — BP 122/78 | HR 73 | Temp 98.8°F | Ht 73.0 in | Wt 189.4 lb

## 2018-01-05 DIAGNOSIS — R634 Abnormal weight loss: Secondary | ICD-10-CM

## 2018-01-05 NOTE — Progress Notes (Signed)
Subjective:  Johnathan Sosa is a 72 y.o. year old very pleasant male patient who presents for/with See problem oriented charting ROS-states appetite has improved some.  Continues to lose weight.  No night sweats or fevers.  Some fatigue.  Past Medical History-  Patient Active Problem List   Diagnosis Date Noted  . Prostate cancer ([place]) 07/13/2017    Priority: High  . History of CVA (cerebrovascular accident) 07/20/2016    Priority: High  . Closed TBI (traumatic brain injury) ([place]) 10/24/2014    Priority: High  . 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 Attention deficit hyperactivity disorder (ADHD) 07/06/2016    Priority: Medium  . Gout     Priority: Medium  . Insomnia     Priority: Medium  . Hyperlipidemia     Priority: Medium  . Depression, major, single episode, in partial remission ([place])     Priority: Medium  . HTN (hypertension) 04/29/2012    Priority: Medium  . Former smoker 10/01/2015    Priority: Low  . Erectile dysfunction 10/01/2015    Priority: Low  . Migraine headache     Priority: Low  . Abnormal eye movements 10/24/2014    Priority: Low  . 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 GERD (gastroesophageal reflux disease)     Priority: Low  . Memory loss     Priority: Low  . Aortic atherosclerosis ([place]) 12/21/2017  . Unintentional weight loss 12/21/2017  . Ileus ([place]) 09/04/2017  . 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 BPPV (benign paroxysmal positional vertigo) 12/21/2016  . Screening for prostate cancer 10/16/2016  . Palpitations 07/20/2016    Medications- reviewed and updated Current Outpatient Medications  Medication Sig Dispense Refill  . allopurinol (ZYLOPRIM) 300 MG tablet Take 1 tablet (300 mg total) by mouth daily. 90 tablet 3  . ALPRAZolam (XANAX) 0.25 MG tablet TAKE BY MOUTH AS NEEDED FOR VERTIGO. DO NOT EXCEED 2 TABS A DAY. 90 tablet 0  . amLODipine (NORVASC) 2.5 MG  tablet Take 1 tablet (2.5 mg total) by mouth daily. 90 tablet 3  . buPROPion (WELLBUTRIN XL) 300 MG 24 hr tablet Take 300 mg by mouth daily.    . clonazePAM (KLONOPIN) 1 MG tablet Take 1 tablet (1 mg total) by mouth at bedtime as needed. 90 tablet 1  . Diphenhydramine-APAP, sleep, (TYLENOL PM EXTRA STRENGTH PO) Take 2 tablets by mouth at bedtime.     No current facility-administered medications for this visit.     Objective: BP 122/78 (BP Location: Left Arm, Patient Position: Sitting, Cuff Size: Normal)   Pulse 73   Temp 98.8 F (37.1 C) (Oral)   Ht 6\' 1"  (1.854 m)   Wt 189 lb 6.4 oz (85.9 kg)   SpO2 97%   BMI 24.99 kg/m  Gen: NAD, resting comfortably CV: RRR no murmurs rubs or gallops Lungs: CTAB no crackles, wheeze, rhonchi Abdomen: soft/nontender/nondistended/normal bowel sounds. No rebound or guarding.  Ext: no edema Skin: warm, dry No cervical, supraclavicular, axillary, groin lymphadenopathy  Assessment/Plan:  Unintentional weight loss S: Patient has lost an additional 4 pounds in the last 2 weeks or so.  He is down 12 pounds in 3 months.  We referred him for colonoscopy last visit and he is scheduled on June 26.  Since last visit has been eating much better. Still not drinking quite as much due to wanting to avoid having to pee  as that has been frequent issues since prostate cancer. Ring has fallen off his finger. Pants falling down.   Extensive bloodwork last visit unrevealing. Had CT abd/pelvis 09/04/17 without obvious cancer.  Had bone scan 07/19/2017. Wt Readings from Last 3 Encounters:  01/05/18 189 lb 6.4 oz (85.9 kg)  12/21/17 193 lb (87.5 kg)  10/07/17 201 lb (91.2 kg)   A/P: We discussed Wellbutrin could contribute to weight loss but he has needed this for his depression.  He does not want to adjust this.  Given extensive prior work-up-discussed next step would be CT chest with contrast to evaluate for any potential malignancy.  He declines for now.  He agrees to  see me back in 4 weeks and if weight loss continues that we would pursue at that point.  He has a trip to [place] where he thinks he will eat much better-though he does admit he has been eating better overall compared to last visit.   Future Appointments  Date Time Provider [place]  02/03/2018 10:45 AM Marin Olp, MD [facility]  02/23/2018  9:30 AM Ladene Artist, MD [facility]  03/17/2018  3:30 PM [name], Asencion Partridge, MD [facility] None   Return in about 1 month (around 02/02/2018) for follow up- or sooner if needed.  Return precautions advised.  [name], MD

## 2018-01-05 NOTE — Patient Instructions (Addendum)
Health Maintenance Due  Topic Date Due  . COLONOSCOPY - Patient has an appointment to see the G.I. Doctor. 03/15/2017   We agreed if still losing weight at same rate by follow up to pursue CT scan of the lungs just to be on the safe side.

## 2018-01-05 NOTE — Assessment & Plan Note (Signed)
S: Patient has lost an additional 4 pounds in the last 2 weeks or so.  He is down 12 pounds in 3 months.  We referred him for colonoscopy last visit and he is scheduled on June 26.  Since last visit has been eating much better. Still not drinking quite as much due to wanting to avoid having to pee as that has been frequent issues since prostate cancer. Ring has fallen off his finger. Pants falling down.   Extensive bloodwork last visit unrevealing. Had CT abd/pelvis 09/04/17 without obvious cancer.  Had bone scan 07/19/2017. Wt Readings from Last 3 Encounters:  01/05/18 189 lb 6.4 oz (85.9 kg)  12/21/17 193 lb (87.5 kg)  10/07/17 201 lb (91.2 kg)   A/P: We discussed Wellbutrin could contribute to weight loss but he has needed this for his depression.  He does not want to adjust this.  Given extensive prior work-up-discussed next step would be CT chest with contrast to evaluate for any potential malignancy.  He declines for now.  He agrees to see me back in 4 weeks and if weight loss continues that we would pursue at that point.  He has a trip to [place] where he thinks he will eat much better-though he does admit he has been eating better overall compared to last visit.

## 2018-01-20 ENCOUNTER — Ambulatory Visit: Payer: Medicare HMO | Admitting: Family Medicine

## 2018-01-26 ENCOUNTER — Other Ambulatory Visit: Payer: Self-pay

## 2018-01-26 DIAGNOSIS — R42 Dizziness and giddiness: Secondary | ICD-10-CM

## 2018-01-26 DIAGNOSIS — S069XAA Unspecified intracranial injury with loss of consciousness status unknown, initial encounter: Secondary | ICD-10-CM

## 2018-01-26 DIAGNOSIS — S069X9A Unspecified intracranial injury with loss of consciousness of unspecified duration, initial encounter: Principal | ICD-10-CM

## 2018-01-26 MED ORDER — ALPRAZOLAM 0.25 MG PO TABS: ORAL_TABLET | ORAL | 0 refills | Status: DC

## 2018-01-28 ENCOUNTER — Telehealth: Payer: Self-pay | Admitting: Family Medicine

## 2018-01-28 NOTE — Telephone Encounter (Signed)
I called and spoke with the pharmacist who confirmed receipt of the prescription for Alprazolam received on 01/26/18. The pharmacist stated that it could not be filled until today as today is the date it is due. She is filling the prescription and will call them and let them know

## 2018-01-28 NOTE — Telephone Encounter (Signed)
See note

## 2018-01-28 NOTE — Telephone Encounter (Unsigned)
Copied from [place] [id number]. Topic: Quick Communication - See Telephone Encounter >> Jan 28, 2018 11:27 AM Neva Seat wrote: Pt's wife Thorsten Climer - sent a message on pt's MyChart  to Dr. [name] to let him know pt is needing a refill on medication. Pt's wife didn't remember the name.  She states pt is almost out.  Needing a refill asap.

## 2018-02-03 ENCOUNTER — Ambulatory Visit (INDEPENDENT_AMBULATORY_CARE_PROVIDER_SITE_OTHER): Payer: Medicare HMO | Admitting: Family Medicine

## 2018-02-03 ENCOUNTER — Encounter: Payer: Self-pay | Admitting: Family Medicine

## 2018-02-03 ENCOUNTER — Telehealth: Payer: Self-pay | Admitting: Hematology

## 2018-02-03 VITALS — BP 118/80 | HR 82 | Temp 97.8°F | Ht 73.0 in | Wt 186.8 lb

## 2018-02-03 DIAGNOSIS — F324 Major depressive disorder, single episode, in partial remission: Secondary | ICD-10-CM | POA: Diagnosis not present

## 2018-02-03 DIAGNOSIS — R634 Abnormal weight loss: Secondary | ICD-10-CM | POA: Diagnosis not present

## 2018-02-03 LAB — CBC
HEMATOCRIT: 43.7 % (ref 39.0–52.0)
Hemoglobin: 15.1 g/dL (ref 13.0–17.0)
MCHC: 34.5 g/dL (ref 30.0–36.0)
MCV: 88.8 fl (ref 78.0–100.0)
Platelets: 222 10*3/uL (ref 150.0–400.0)
RBC: 4.92 Mil/uL (ref 4.22–5.81)
RDW: 13.7 % (ref 11.5–15.5)
WBC: 7.4 10*3/uL (ref 4.0–10.5)

## 2018-02-03 LAB — COMPREHENSIVE METABOLIC PANEL
ALT: 21 U/L (ref 0–53)
AST: 15 U/L (ref 0–37)
Albumin: 4.1 g/dL (ref 3.5–5.2)
Alkaline Phosphatase: 50 U/L (ref 39–117)
BUN: 22 mg/dL (ref 6–23)
CALCIUM: 9.4 mg/dL (ref 8.4–10.5)
CHLORIDE: 108 meq/L (ref 96–112)
CO2: 26 meq/L (ref 19–32)
CREATININE: 1.06 mg/dL (ref 0.40–1.50)
GFR: 73.46 mL/min (ref >60.00)
Glucose, Bld: 106 mg/dL — ABNORMAL HIGH (ref 70–99)
POTASSIUM: 4.2 meq/L (ref 3.5–5.1)
SODIUM: 141 meq/L (ref 135–145)
Total Bilirubin: 0.5 mg/dL (ref 0.2–1.2)
Total Protein: 6.2 g/dL (ref 6.0–8.3)

## 2018-02-03 LAB — TSH: TSH: 0.79 u[IU]/mL (ref 0.35–4.50)

## 2018-02-03 NOTE — Telephone Encounter (Signed)
Not an appropriate reason for early refill- if he wants an early refill he needs to come back in for another visit.  We need to go over a contract for the clonazepam and appropriate use we may not be able to accommodate this visit until next week.

## 2018-02-03 NOTE — Telephone Encounter (Signed)
See note

## 2018-02-03 NOTE — Patient Instructions (Addendum)
Health Maintenance Due  Topic Date Due  . COLONOSCOPY - glad you are set up tosee GI on the 26th 03/15/2017   We will call you within two weeks about your referral for CT scan of lungs. If you do not hear within 3 weeks, give [place] a call.   Lets check back in 4-6 weeks  Try to add an ensure or boost 1 to 2 times a day to help get you some more protein/calories and hopefully build strength

## 2018-02-03 NOTE — Telephone Encounter (Signed)
See patients request below:  LOV 02-03-18 with Dr. [name] Refill request for Klonopin Last filled  Disp Refills Start End   clonazePAM (KLONOPIN) 1 MG tablet 90 tablet 1 10/07/2017    Sig - Route: Take 1 tablet (1 mg total) by mouth at bedtime as needed. - Oral      Copied from [place] [id number]. Topic: Quick Communication - See Telephone Encounter >> Feb 03, 2018  1:52 PM Vernona Rieger wrote: CRM for notification. See Telephone encounter for: 02/03/18.  Patient said that he is currently out of his clonazePAM (KLONOPIN) 1 MG tablet because he has been giving his wife them since she was out. Please advise  [facility] [address] [place] [address]   buPROPion (WELLBUTRIN XL) 300 MG 24 hr tablet

## 2018-02-03 NOTE — Assessment & Plan Note (Addendum)
S: He reports very low energy at least over the last month- seems to have worsened. Has lost another 3 lbs. Didn't go to [place] as planned so didn't eat as well as he anticipated. He is not trying to lose weight.  He is with his wife most meals and she states he is eating well.  He denies chest pain or shortness of breath.  He feels weaker with weight loss and even tripped this morning  PHQ 9 of 6 with no suicidal ideation.  He remains on Wellbutrin Wt Readings from Last 3 Encounters:  02/03/18 186 lb 12.8 oz (84.7 kg)  01/05/18 189 lb 6.4 oz (85.9 kg)  12/21/17 193 lb (87.5 kg)   A/P: Patient and wife agreed to pursue CT scan of the chest.  With worsening fatigue we will also get CBC, CMP, TSH.  I encouraged him to take a boost or Ensure 1-2 times a day in addition to his other foods.  He needs to be careful with his footing-if he has more falls likely need to use a cane  GI visit in 20 days- needs updated colonoscopy  One other idea-possibly stop Wellbutrin to see if that is contributing to weight loss though this could worsen his depression.  I am hesitant to do this because I wonder if he is under reporting his depression- weight loss seems to have worsened since wife in the house though they are still together

## 2018-02-03 NOTE — Progress Notes (Signed)
Subjective:  Johnathan Sosa is a 72 y.o. year old very pleasant male patient who presents for/with See problem oriented charting ROS-admits to fatigue but otherwise feels normal- .rsoh  Past Medical History-  Patient Active Problem List   Diagnosis Date Noted  . Prostate cancer ([place]) 07/13/2017    Priority: High  . History of CVA (cerebrovascular accident) 07/20/2016    Priority: High  . Closed TBI (traumatic brain injury) ([place]) 10/24/2014    Priority: High  . 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 Memory loss     Priority: High  . Attention deficit hyperactivity disorder (ADHD) 07/06/2016    Priority: Medium  . Gout     Priority: Medium  . Insomnia     Priority: Medium  . Hyperlipidemia     Priority: Medium  . Depression, major, single episode, in partial remission ([place])     Priority: Medium  . HTN (hypertension) 04/29/2012    Priority: Medium  . Former smoker 10/01/2015    Priority: Low  . Erectile dysfunction 10/01/2015    Priority: Low  . Migraine headache     Priority: Low  . Abnormal eye movements 10/24/2014    Priority: Low  . 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 GERD (gastroesophageal reflux disease)     Priority: Low  . Aortic atherosclerosis ([place]) 12/21/2017  . Unintentional weight loss 12/21/2017  . Ileus ([place]) 09/04/2017  . 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 BPPV (benign paroxysmal positional vertigo) 12/21/2016  . Screening for prostate cancer 10/16/2016  . Palpitations 07/20/2016    Medications- reviewed and updated Current Outpatient Medications  Medication Sig Dispense Refill  . allopurinol (ZYLOPRIM) 300 MG tablet Take 1 tablet (300 mg total) by mouth daily. 90 tablet 3  . ALPRAZolam (XANAX) 0.25 MG tablet TAKE BY MOUTH AS NEEDED FOR VERTIGO. DO NOT EXCEED 2 TABS A DAY. 90 tablet 0  . amLODipine (NORVASC) 2.5 MG tablet Take 1 tablet (2.5 mg total) by mouth daily. 90  tablet 3  . buPROPion (WELLBUTRIN XL) 300 MG 24 hr tablet Take 300 mg by mouth daily.    . clonazePAM (KLONOPIN) 1 MG tablet Take 1 tablet (1 mg total) by mouth at bedtime as needed. 90 tablet 1  . Diphenhydramine-APAP, sleep, (TYLENOL PM EXTRA STRENGTH PO) Take 2 tablets by mouth at bedtime.     No current facility-administered medications for this visit.    Objective: BP 118/80 (BP Location: Left Arm, Patient Position: Sitting, Cuff Size: Normal)   Pulse 82   Temp 97.8 F (36.6 C) (Oral)   Ht 6\' 1"  (1.854 m)   Wt 186 lb 12.8 oz (84.7 kg)   SpO2 96%   BMI 24.65 kg/m  Gen: NAD, resting comfortably CV: RRR no murmurs rubs or gallops Lungs: CTAB no crackles, wheeze, rhonchi Abdomen: soft/nontender/nondistended/normal bowel sounds.  Ext: no edema Skin: warm, dry  Assessment/Plan:  Unintentional weight loss S: He reports very low energy at least over the last month- seems to have worsened. Has lost another 3 lbs. Didn't go to [place] as planned so didn't eat as well as he anticipated. He is not trying to lose weight.  He is with his wife most meals and she states he is eating well.  He denies chest pain or shortness of breath.  He feels weaker with weight loss and even tripped this morning  PHQ 9 of 6 with  no suicidal ideation.  He remains on Wellbutrin Wt Readings from Last 3 Encounters:  02/03/18 186 lb 12.8 oz (84.7 kg)  01/05/18 189 lb 6.4 oz (85.9 kg)  12/21/17 193 lb (87.5 kg)   A/P: Patient and wife agreed to pursue CT scan of the chest.  With worsening fatigue we will also get CBC, CMP, TSH.  I encouraged him to take a boost or Ensure 1-2 times a day in addition to his other foods.  He needs to be careful with his footing-if he has more falls likely need to use a cane  GI visit in 20 days- needs updated colonoscopy  One other idea-possibly stop Wellbutrin to see if that is contributing to weight loss though this could worsen his depression.  I am hesitant to do this because  I wonder if he is under reporting his depression- weight loss seems to have worsened since wife in the house though they are still together   Future Appointments  Date Time Provider [place]  02/23/2018  9:30 AM Ladene Artist, MD [facility]  03/10/2018 11:15 AM Marin Olp, MD [facility]  03/17/2018  3:30 PM [name], Asencion Partridge, MD [facility] None   4 to 6-week follow-up  Lab/Order associations: Abnormal weight loss - Plan: CT Chest W Contrast, CBC, Comprehensive metabolic panel, TSH  Return precautions advised.  [name], MD

## 2018-02-09 ENCOUNTER — Telehealth: Payer: Self-pay | Admitting: Family Medicine

## 2018-02-09 NOTE — Telephone Encounter (Signed)
Copied from [place] [id number]. Topic: Quick Communication - See Telephone Encounter >> Feb 09, 2018  1:42 PM Ivar Drape wrote: CRM for notification. See Telephone encounter for: 02/09/18. [place] w/[facility] in behalf of Mcarthur Rossetti [id number] Option 1  stated the authorization is pending until a facility is selected.

## 2018-02-09 NOTE — Telephone Encounter (Signed)
Johnathan Sosa, please see message.

## 2018-02-18 ENCOUNTER — Ambulatory Visit (INDEPENDENT_AMBULATORY_CARE_PROVIDER_SITE_OTHER)
Admission: RE | Admit: 2018-02-18 | Discharge: 2018-02-18 | Disposition: A | Discharge status: Home / Self Care | Payer: Medicare HMO | Source: Ambulatory Visit | Attending: Family Medicine | Admitting: Family Medicine

## 2018-02-18 DIAGNOSIS — R634 Abnormal weight loss: Secondary | ICD-10-CM | POA: Diagnosis not present

## 2018-02-18 DIAGNOSIS — Z8546 Personal history of malignant neoplasm of prostate: Secondary | ICD-10-CM | POA: Diagnosis not present

## 2018-02-18 DIAGNOSIS — I700 Atherosclerosis of aorta: Secondary | ICD-10-CM | POA: Diagnosis not present

## 2018-02-18 MED ORDER — IOPAMIDOL (ISOVUE-300) INJECTION 61%
80.0000 mL | Freq: Once | INTRAVENOUS | Status: AC | PRN
  Administered 2018-02-18: 80 mL via INTRAVENOUS

## 2018-02-23 ENCOUNTER — Encounter: Payer: Self-pay | Admitting: Gastroenterology

## 2018-02-23 ENCOUNTER — Ambulatory Visit: Payer: Medicare HMO | Admitting: Gastroenterology

## 2018-02-23 VITALS — BP 100/60 | HR 66 | Ht 73.0 in | Wt 189.2 lb

## 2018-02-23 DIAGNOSIS — R634 Abnormal weight loss: Secondary | ICD-10-CM

## 2018-02-23 DIAGNOSIS — Z800 Family history of malignant neoplasm of digestive organs: Secondary | ICD-10-CM
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
## 2018-02-23 NOTE — Patient Instructions (Signed)
It has been recommended to you by your physician that you have a(n) Colonoscopy completed. Per your request, we did not schedule the procedure(s) today. Please contact our office at [phone] should you decide to have the procedure completed.  You can start Boost or Ensure to help with meal supplements.   Thank you for choosing me and [facility].  Pricilla Riffle. Dagoberto Ligas., MD., Marval Regal

## 2018-02-28 ENCOUNTER — Telehealth: Payer: Self-pay | Admitting: Family Medicine

## 2018-02-28 DIAGNOSIS — R42 Dizziness and giddiness: Secondary | ICD-10-CM

## 2018-02-28 DIAGNOSIS — S069XAA Unspecified intracranial injury with loss of consciousness status unknown, initial encounter: Secondary | ICD-10-CM

## 2018-02-28 DIAGNOSIS — S069X9A Unspecified intracranial injury with loss of consciousness of unspecified duration, initial encounter: Principal | ICD-10-CM

## 2018-03-02 ENCOUNTER — Other Ambulatory Visit: Payer: Self-pay | Admitting: Neurology

## 2018-03-02 DIAGNOSIS — S069X9A Unspecified intracranial injury with loss of consciousness of unspecified duration, initial encounter: Principal | ICD-10-CM

## 2018-03-02 DIAGNOSIS — S069XAA Unspecified intracranial injury with loss of consciousness status unknown, initial encounter: Secondary | ICD-10-CM

## 2018-03-02 DIAGNOSIS — R42 Dizziness and giddiness: Secondary | ICD-10-CM

## 2018-03-02 NOTE — Telephone Encounter (Signed)
Last fill was 01/26/18. Given 90 with no refills. Takes as needed for vertigo, cant exceed more than 2 tablets a day. Last appointment 02/23/18.

## 2018-03-02 NOTE — Telephone Encounter (Signed)
Pt called in to follow up on his refill request. Pt says that the Rx that he received was for 60 tablets pt says that he never received 90. Pt says that he is almost out of his medication and is scared to give out. I did advise pt that provider did prescribe 90 tablets and we will assist him further with this concern.   Please advise.    CB: [phone]

## 2018-03-02 NOTE — Telephone Encounter (Signed)
See note

## 2018-03-04 NOTE — Telephone Encounter (Signed)
Looks like he has an appointment on 03/10/18- he should have enough to get to that visit than we can discuss right?

## 2018-03-04 NOTE — Telephone Encounter (Signed)
CRM created. 

## 2018-03-04 NOTE — Telephone Encounter (Signed)
Called the pharmacy to confirm prescriptions and dispenses. Patient was prescribed 90 tablets on 11/18/17. The pharmacy dispensed the prescription as follows:  11/18/17 60 tabs dispensed 01/02/18  30 tabs dispensed  Prescription on 01/28/2018 was dispensed as follows:  01/28/2018 60 tabs dispensed 03/02/2018 30 tablets dispensed  Patient is upset that he is only being given a 45 day prescription at a time from our office and a 30 day prescription at time from the pharmacy. He states he needs 2 tablets a day every day. I confirmed with him that he understands this medication is to be used for vertigo. He wants prescription changed to a 90 day prescription. Informed patient that insurance can do a quantity cap for how many he gets at a time. Instructed he will need to contact his insurance company to confirm. He verbalized understanding   [facility] confirmed [facility]   Pharmacy did confirm at this time that he has no more refills on file  Please advise

## 2018-03-04 NOTE — Telephone Encounter (Signed)
Called and left a voicemail message asking for a return phone call. Patient needs to call his pharmacy. He should have a refill on file. His insurance has changed the quantity amounts that the pharmacy can ship out.

## 2018-03-07 NOTE — Telephone Encounter (Signed)
Spoke to patient's wife, [name].  She was unaware that pt had appt for 03/10/18.  I explained that this rx was just filled on 01/26/18 with a qty of #90.  It was refilled again on 03/02/18 #30.  Pt should have enough medication to last him until appt scheduled on 7/11.  Pt's wife agreed and verbalized understanding.

## 2018-03-09 ENCOUNTER — Ambulatory Visit: Payer: Medicare HMO | Admitting: Neurology

## 2018-03-10 ENCOUNTER — Ambulatory Visit (INDEPENDENT_AMBULATORY_CARE_PROVIDER_SITE_OTHER): Payer: Medicare HMO | Admitting: Family Medicine

## 2018-03-10 ENCOUNTER — Encounter: Payer: Self-pay | Admitting: Family Medicine

## 2018-03-10 DIAGNOSIS — S069X9A Unspecified intracranial injury with loss of consciousness of unspecified duration, initial encounter: Secondary | ICD-10-CM

## 2018-03-10 DIAGNOSIS — R634 Abnormal weight loss: Secondary | ICD-10-CM

## 2018-03-10 DIAGNOSIS — R42 Dizziness and giddiness: Secondary | ICD-10-CM | POA: Diagnosis not present

## 2018-03-10 DIAGNOSIS — F324 Major depressive disorder, single episode, in partial remission: Secondary | ICD-10-CM | POA: Diagnosis not present

## 2018-03-10 DIAGNOSIS — S069XAA Unspecified intracranial injury with loss of consciousness status unknown, initial encounter: Secondary | ICD-10-CM

## 2018-03-10 MED ORDER — ALPRAZOLAM 0.25 MG PO TABS: ORAL_TABLET | ORAL | 1 refills | Status: DC

## 2018-03-10 MED ORDER — ESCITALOPRAM OXALATE 5 MG PO TABS: 5.0000 mg | ORAL_TABLET | Freq: Every day | ORAL | 2 refills | Status: DC

## 2018-03-10 NOTE — Progress Notes (Signed)
Subjective:  Johnathan Sosa is a 72 y.o. year old very pleasant male patient who presents for/with See problem oriented charting ROS- patient admits to fatigue in afternoons. Denies chest pain. Stays up goo late- sleeps only 5 hours with klonopin, vertigo better on xanax   Past Medical History-  Patient Active Problem List   Diagnosis Date Noted  . Unintentional weight loss 12/21/2017    Priority: High  . Prostate cancer ([place]) 07/13/2017    Priority: High  . History of CVA (cerebrovascular accident) 07/20/2016    Priority: High  . Closed TBI (traumatic brain injury) ([place]) 10/24/2014    Priority: High  . 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 Memory loss     Priority: High  . Attention deficit hyperactivity disorder (ADHD) 07/06/2016    Priority: Medium  . Gout     Priority: Medium  . Insomnia     Priority: Medium  . Hyperlipidemia     Priority: Medium  . Depression, major, single episode, in partial remission ([place])     Priority: Medium  . HTN (hypertension) 04/29/2012    Priority: Medium  . Former smoker 10/01/2015    Priority: Low  . Erectile dysfunction 10/01/2015    Priority: Low  . Migraine headache     Priority: Low  . Abnormal eye movements 10/24/2014    Priority: Low  . 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 GERD (gastroesophageal reflux disease)     Priority: Low  . Aortic atherosclerosis ([place]) 12/21/2017  . Ileus ([place]) 09/04/2017  . 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 BPPV (benign paroxysmal positional vertigo) 12/21/2016  . Screening for prostate cancer 10/16/2016  . Palpitations 07/20/2016    Medications- reviewed and updated Current Outpatient Medications  Medication Sig Dispense Refill  . allopurinol (ZYLOPRIM) 300 MG tablet Take 1 tablet (300 mg total) by mouth daily. 90 tablet 3  . ALPRAZolam (XANAX) 0.25 MG tablet BID prn. TAKE BY MOUTH AS NEEDED FOR VERTIGO. DO NOT  EXCEED 2 TABS A DAY. 60 tablet 1  . amLODipine (NORVASC) 2.5 MG tablet Take 1 tablet (2.5 mg total) by mouth daily. 90 tablet 3  . buPROPion (WELLBUTRIN XL) 300 MG 24 hr tablet Take 300 mg by mouth daily.    . clonazePAM (KLONOPIN) 1 MG tablet Take 1 tablet (1 mg total) by mouth at bedtime as needed. 90 tablet 1  . escitalopram (LEXAPRO) 5 MG tablet Take 1 tablet (5 mg total) by mouth daily. 30 tablet 2   No current facility-administered medications for this visit.     Objective: BP 118/68   Pulse 81   Temp 98.4 F (36.9 C) (Oral)   Ht 6\' 1"  (1.854 m)   Wt 181 lb (82.1 kg)   SpO2 97%   BMI 23.88 kg/m  Gen: NAD, resting comfortably Psych: depressed mood, tangential at times  Assessment/Plan:  Depression, major, single episode, in partial remission ([facility]) S: I am very concerned that patients weight loss, fatigue, abnormal sleep patterns could be due to depression. Though he stays up late- he does not then wake up after rest and feel full of energy (basically no obvious bipolar symptoms).   Patient does not want to return to psychiatry- he did not have the best experience with prior provider who patient claims told him to abruptly stop his wellbutrin. Patient called prior provider an "idiot" and I was clear with patient  that I did not appreciate such language toward a prior physician.   A/P:I am also concerned that the wellbutrin itself could contribute to poor appetite (patient may simply need more calories than he is getting though he claims he eats well). We have done an extensive weight loss workup already.   I was clear with patient and wife that I thought the best place for him to be seen would be psychiatry especially with him using xanax for vertigo, klonopin for sleep but patient is FIRMLY against this- we may have to push again in future for this.   We will add lexapro 5mg  and reassess 6-8 weeks. Hopefully if he is reasonably stable at that time we can increase lexapro and go  down on wellbutrin. I implored patient to stick with the medicine. He has stopped multiple medications in past without telling me either for side effects or because he believed he wasn't depressed.  From prior notes/overview: "wellbutrin (had been on 300mg  and then 450 a day- then diarrhea started and he stopped). Strongly declines restart Sertraline (stopped because he states didn't feel depressed) Lexapro/escitalopram (stopped for unclear reason) Prozac (started by Dr. Jannifer Franklin 5 days ago- he has taken 2 doses) Nortriptyline in past (unclear who started or stopped)"   Unintentional weight loss S:  weight is down another 5 lbs since last visit. CT chest did not show cause of weight loss. Still needs to call for an appointment for colonoscopy with GI though has had initial sit down. GI thought Depression as posisble cause  Collection of 5000 pieces of depression glass. Packing up and inventorying the glass- wants to sell it. Staying up all night and then when dawn comes he goes to bed Getting 4-5 hours of sleep at that point. Wakes up and feels good  And then feels tired after several hours of working. Eating 4 eggs each morning - often adding in milk or bowl of peaches. For dinner eats steak and potatoes for dinner. Eating lung in afternoon. Feels so exhausted by afternoons that they dont think they can do any trips (him and wife). Wife has encouraged ensure.  A/P: Extensive workup as per prior notes. Have followed up to date unintentional weight loss algorithm. Will try to treat depression and wean wellbutrin. Have not done UDS- that would be reasonable.   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 He states xanax has really helped his vertigo and he cannot sleep without klonopin A/P: I told patient with him using 2 different benzodiazepines my preference would be to wean those given his drugged out feeling- he feels he cannot tolerate  being without them right now- would love if Dr. 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 ALPRAZolam (XANAX) 0.25 MG tablet    Sig: BID prn. TAKE BY MOUTH AS NEEDED FOR VERTIGO. DO NOT EXCEED 2 TABS A DAY.    Dispense:  60 tablet  Refill:  1    Not to exceed 5 additional fills before 02/16/2018.  [name] Kitchen escitalopram (LEXAPRO) 5 MG tablet    Sig: Take 1 tablet (5 mg total) by mouth daily.    Dispense:  30 tablet    Refill:  2    Return precautions advised.  [name], MD

## 2018-03-10 NOTE — Patient Instructions (Addendum)
Start escitalopram 5mg  daily once back from trip  I am glad your work up for weight loss has been reassuring so far. Do need to complete colonoscopy- please call GI back to schedule.   Lets check in 8 weeks form now to give escitalopram enough time to work  Taking the medicine as directed and not missing any doses is one of the best things you can do to treat your depression.  Here are some things to keep in mind:  1) Side effects (stomach upset, some increased anxiety) may happen before you notice a benefit.  These side effects typically go away over time. 2) Changes to your dose of medicine or a change in medication all together is sometimes necessary 3) Most people need to be on medication at least 6-12 months 4) Many people will notice an improvement within two weeks but the full effect of the medication can take up to 4-6 weeks 5) Stopping the medication when you start feeling better often results in a return of symptoms 6) If you start having thoughts of hurting yourself or others after starting this medicine, call our office immediately at [phone] or seek care through 911.

## 2018-03-11 NOTE — Assessment & Plan Note (Signed)
S:  weight is down another 5 lbs since last visit. CT chest did not show cause of weight loss. Still needs to call for an appointment for colonoscopy with GI though has had initial sit down. GI thought Depression as posisble cause  Collection of 5000 pieces of depression glass. Packing up and inventorying the glass- wants to sell it. Staying up all night and then when dawn comes he goes to bed Getting 4-5 hours of sleep at that point. Wakes up and feels good  And then feels tired after several hours of working. Eating 4 eggs each morning - often adding in milk or bowl of peaches. For dinner eats steak and potatoes for dinner. Eating lung in afternoon. Feels so exhausted by afternoons that they dont think they can do any trips (him and wife). Wife has encouraged ensure.  A/P: Extensive workup as per prior notes. Have followed up to date unintentional weight loss algorithm. Will try to treat depression and wean wellbutrin. Have not done UDS- that would be reasonable.

## 2018-03-11 NOTE — Assessment & Plan Note (Signed)
S: Patient states he feels "drugged out" as reason to not take lexapro. He states xanax has really helped his vertigo and he cannot sleep without klonopin A/P: I told patient with him using 2 different benzodiazepines my preference would be to wean those given his drugged out feeling- he feels he cannot tolerate being without them right now- would love if Dr. Brett Fairy could explore other options for vertigo and or sleep and once again would strongly prefer patient to see psychiatry if we can get him to reconsider this

## 2018-03-11 NOTE — Assessment & Plan Note (Signed)
S: I am very concerned that patients weight loss, fatigue, abnormal sleep patterns could be due to depression. Though he stays up late- he does not then wake up after rest and feel full of energy (basically no obvious bipolar symptoms).   Patient does not want to return to psychiatry- he did not have the best experience with prior provider who patient claims told him to abruptly stop his wellbutrin. Patient called prior provider an "idiot" and I was clear with patient that I did not appreciate such language toward a prior physician.   A/P:I am also concerned that the wellbutrin itself could contribute to poor appetite (patient may simply need more calories than he is getting though he claims he eats well). We have done an extensive weight loss workup already.   I was clear with patient and wife that I thought the best place for him to be seen would be psychiatry especially with him using xanax for vertigo, klonopin for sleep but patient is FIRMLY against this- we may have to push again in future for this.   We will add lexapro 5mg  and reassess 6-8 weeks. Hopefully if he is reasonably stable at that time we can increase lexapro and go down on wellbutrin. I implored patient to stick with the medicine. He has stopped multiple medications in past without telling me either for side effects or because he believed he wasn't depressed.  From prior notes/overview: "wellbutrin (had been on 300mg  and then 450 a day- then diarrhea started and he stopped). Strongly declines restart Sertraline (stopped because he states didn't feel depressed) Lexapro/escitalopram (stopped for unclear reason) Prozac (started by Dr. Jannifer Franklin 5 days ago- he has taken 2 doses) Nortriptyline in past (unclear who started or stopped)"

## 2018-03-17 ENCOUNTER — Ambulatory Visit: Payer: Medicare HMO | Admitting: Neurology

## 2018-03-28 ENCOUNTER — Telehealth: Payer: Self-pay | Admitting: Family Medicine

## 2018-04-01 NOTE — Telephone Encounter (Signed)
Called pharmacy and they confirmed prescription is ready for pick up

## 2018-04-01 NOTE — Telephone Encounter (Signed)
See note

## 2018-04-01 NOTE — Telephone Encounter (Signed)
Prescription was sent to the pharmacy on 03/31/18.

## 2018-04-01 NOTE — Telephone Encounter (Signed)
Pt is out of med. Pt is aware dr hunter not in office

## 2018-04-02 ENCOUNTER — Other Ambulatory Visit: Payer: Self-pay | Admitting: Family Medicine

## 2018-04-12 ENCOUNTER — Ambulatory Visit: Payer: Medicare HMO | Admitting: Neurology

## 2018-05-06 ENCOUNTER — Ambulatory Visit: Payer: Medicare HMO | Admitting: Family Medicine

## 2018-05-06 DIAGNOSIS — Z961 Presence of intraocular lens: Secondary | ICD-10-CM | POA: Diagnosis not present

## 2018-05-06 DIAGNOSIS — H43811 Vitreous degeneration, right eye: Secondary | ICD-10-CM | POA: Diagnosis not present

## 2018-05-16 ENCOUNTER — Ambulatory Visit (INDEPENDENT_AMBULATORY_CARE_PROVIDER_SITE_OTHER): Payer: Medicare HMO | Admitting: Family Medicine

## 2018-05-16 ENCOUNTER — Telehealth: Payer: Self-pay | Admitting: Neurology

## 2018-05-16 ENCOUNTER — Encounter: Payer: Self-pay | Admitting: Family Medicine

## 2018-05-16 ENCOUNTER — Encounter: Payer: Self-pay | Admitting: Gastroenterology

## 2018-05-16 VITALS — BP 118/70 | HR 67 | Temp 98.9°F | Ht 73.0 in | Wt 188.2 lb

## 2018-05-16 DIAGNOSIS — C61 Malignant neoplasm of prostate: Secondary | ICD-10-CM

## 2018-05-16 DIAGNOSIS — F324 Major depressive disorder, single episode, in partial remission: Secondary | ICD-10-CM | POA: Diagnosis not present

## 2018-05-16 DIAGNOSIS — Z23 Encounter for immunization: Secondary | ICD-10-CM | POA: Diagnosis not present

## 2018-05-16 DIAGNOSIS — R634 Abnormal weight loss: Secondary | ICD-10-CM | POA: Diagnosis not present

## 2018-05-16 NOTE — Assessment & Plan Note (Signed)
In regards to radical prostatectomy- Incontinence is better- exercises from urology have helped. He is more continent and about to stop the adult diapers. Encouraged him to continue to work on these

## 2018-05-16 NOTE — Telephone Encounter (Signed)
Noted. Patient has an upcoming apt we can discuss how things are going.

## 2018-05-16 NOTE — Telephone Encounter (Signed)
Pt's wife said they had been on a trip to [place]. When they got back she asked him how was the trip for him. He said good except things on the side of the rode kept jumping out in front of me. He said this happens when he gets tired.  FYI

## 2018-05-16 NOTE — Addendum Note (Signed)
Addended by: Marin Olp on: 05/16/2018 09:59 PM   Modules accepted: Level of Service

## 2018-05-16 NOTE — Patient Instructions (Signed)
Lets continue current medicines! Thrilled you have had such a good stretch here!   Since weight is going back up, how about we do 3-6 month check

## 2018-05-16 NOTE — Progress Notes (Signed)
Subjective:  Johnathan Sosa is a 72 y.o. year old very pleasant male patient who presents for/with See problem oriented charting ROS- gaining weight- eating more high calorie things like milk shakes. No fever or chills. No chest pain. Denies irritability. No SI.    Past Medical History-  Patient Active Problem List   Diagnosis Date Noted  . Unintentional weight loss 12/21/2017    Priority: High  . Prostate cancer ([place]) 07/13/2017    Priority: High  . History of CVA (cerebrovascular accident) 07/20/2016    Priority: High  . Closed TBI (traumatic brain injury) ([place]) 10/24/2014    Priority: High  . 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 Memory loss     Priority: High  . Attention deficit hyperactivity disorder (ADHD) 07/06/2016    Priority: Medium  . Gout     Priority: Medium  . Insomnia     Priority: Medium  . Hyperlipidemia     Priority: Medium  . Depression, major, single episode, in partial remission ([place])     Priority: Medium  . HTN (hypertension) 04/29/2012    Priority: Medium  . Former smoker 10/01/2015    Priority: Low  . Erectile dysfunction 10/01/2015    Priority: Low  . Migraine headache     Priority: Low  . Abnormal eye movements 10/24/2014    Priority: Low  . 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 GERD (gastroesophageal reflux disease)     Priority: Low  . Aortic atherosclerosis ([place]) 12/21/2017  . Ileus ([place]) 09/04/2017  . 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 BPPV (benign paroxysmal positional vertigo) 12/21/2016  . Screening for prostate cancer 10/16/2016  . Palpitations 07/20/2016    Medications- reviewed and updated Current Outpatient Medications  Medication Sig Dispense Refill  . allopurinol (ZYLOPRIM) 300 MG tablet Take 1 tablet (300 mg total) by mouth daily. 90 tablet 3  . ALPRAZolam (XANAX) 0.25 MG tablet BID prn. TAKE BY MOUTH AS NEEDED FOR VERTIGO. DO NOT EXCEED 2 TABS  A DAY. 60 tablet 1  . amLODipine (NORVASC) 2.5 MG tablet Take 1 tablet (2.5 mg total) by mouth daily. 90 tablet 3  . buPROPion (WELLBUTRIN XL) 300 MG 24 hr tablet Take 300 mg by mouth daily.    . clonazePAM (KLONOPIN) 1 MG tablet TAKE 1 TABLET (1 MG TOTAL) BY MOUTH AT BEDTIME AS NEEDED. 30 tablet 5  . escitalopram (LEXAPRO) 5 MG tablet TAKE 1 TABLET BY MOUTH EVERY DAY 90 tablet 1   No current facility-administered medications for this visit.     Objective: BP 118/70 (BP Location: Left Arm, Patient Position: Sitting, Cuff Size: Large)   Pulse 67   Temp 98.9 F (37.2 C) (Oral)   Ht 6\' 1"  (1.854 m)   Wt 188 lb 3.2 oz (85.4 kg)   SpO2 96%   BMI 24.83 kg/m  Gen: NAD, resting comfortably More fullness in face and arms as well as abdomen CV: RRR no murmurs rubs or gallops Lungs: CTAB no crackles, wheeze, rhonchi Abdomen: soft/nontender/nondistended/normal bowel sounds.  Ext: no edema Skin: warm, dry  Assessment/Plan:  Other notes: 1.Balance issues/vertigo slightly better. No falls since last visit.   Unintentional weight loss S: I had discussed with patient previously I was very concerned that his weight loss was related to inadequately treated depression. He started lexapro last visit. See depression section- he has gained 7 lbs in since staring lexapro  A/P: once again- I think above info points toward depression as root cause of weight loss. Prior extensive evaluation unrevealing. Does have upcoming colonoscopy as final step in prior weight loss evaluation.   Depression, major, single episode, in partial remission ([place]) S: PHQ9 score improved from April but stable from last check in June. Since starting lexapro 5mg - has gained 7 lbs. He seems to be less irritable with his wife today. He is sleeping better A/P: I was planning on reducing wellbutrin but with weight gain on lexapro alone I will hold off on that adjustment - continue current rx. Discussed possibly increasing to 10mg  but  he and wife seem about the happiest they have in sometime so will not make further changes.   Prostate cancer ([place]) In regards to radical prostatectomy- Incontinence is better- exercises from urology have helped. He is more continent and about to stop the adult diapers. Encouraged him to continue to work on these  Future Appointments  Date Time Provider [place]  06/07/2018  1:30 PM [name], Asencion Partridge, MD [facility] None  06/09/2018  8:00 AM [facility] PREVISIT RM 51 [facility]  06/29/2018  2:00 PM Ladene Artist, MD [facility]   Lab/Order associations: Need for prophylactic vaccination and inoculation against influenza - Plan: Flu vaccine HIGH DOSE PF  Unintentional weight loss  Depression, major, single episode, in partial remission ([place])  Return precautions advised.  [name], MD

## 2018-05-16 NOTE — Assessment & Plan Note (Addendum)
S: I had discussed with patient previously I was very concerned that his weight loss was related to inadequately treated depression. He started lexapro last visit. See depression section- he has gained 7 lbs in since staring lexapro A/P: once again- I think above info points toward depression as root cause of weight loss. Prior extensive evaluation unrevealing. Does have upcoming colonoscopy as final step in prior weight loss evaluation.

## 2018-05-16 NOTE — Assessment & Plan Note (Signed)
S: PHQ9 score improved from April but stable from last check in June. Since starting lexapro 5mg - has gained 7 lbs. He seems to be less irritable with his wife today. He is sleeping better A/P: I was planning on reducing wellbutrin but with weight gain on lexapro alone I will hold off on that adjustment - continue current rx. Discussed possibly increasing to 10mg  but he and wife seem about the happiest they have in sometime so will not make further changes.

## 2018-06-07 ENCOUNTER — Encounter

## 2018-06-07 ENCOUNTER — Ambulatory Visit: Payer: Medicare HMO | Admitting: Neurology

## 2018-06-07 ENCOUNTER — Encounter: Payer: Self-pay | Admitting: Neurology

## 2018-06-07 VITALS — BP 113/73 | HR 78 | Ht 73.0 in | Wt 199.0 lb

## 2018-06-07 DIAGNOSIS — F32A Depression, unspecified: Secondary | ICD-10-CM | POA: Insufficient documentation

## 2018-06-07 DIAGNOSIS — S069X9A Unspecified intracranial injury with loss of consciousness of unspecified duration, initial encounter: Secondary | ICD-10-CM

## 2018-06-07 DIAGNOSIS — G4719 Other hypersomnia: Secondary | ICD-10-CM | POA: Diagnosis not present

## 2018-06-07 DIAGNOSIS — F329 Major depressive disorder, single episode, unspecified: Secondary | ICD-10-CM | POA: Diagnosis not present

## 2018-06-07 DIAGNOSIS — R42 Dizziness and giddiness: Secondary | ICD-10-CM | POA: Diagnosis not present

## 2018-06-07 DIAGNOSIS — G3184 Mild cognitive impairment, so stated: Secondary | ICD-10-CM | POA: Insufficient documentation

## 2018-06-07 DIAGNOSIS — R5383 Other fatigue: Secondary | ICD-10-CM

## 2018-06-07 MED ORDER — BUPROPION HCL ER (XL) 300 MG PO TB24: 300.0000 mg | ORAL_TABLET | Freq: Every day | ORAL | 1 refills | Status: DC

## 2018-06-07 MED ORDER — CITALOPRAM HYDROBROMIDE 10 MG PO TABS: 10.0000 mg | ORAL_TABLET | Freq: Every day | ORAL | 3 refills | Status: DC

## 2018-06-07 MED ORDER — ALPRAZOLAM 0.25 MG PO TABS: ORAL_TABLET | ORAL | 1 refills | Status: DC

## 2018-06-07 MED ORDER — BUPROPION HCL ER (SR) 150 MG PO TB12: 150.0000 mg | ORAL_TABLET | Freq: Two times a day (BID) | ORAL | 2 refills | Status: DC

## 2018-06-07 NOTE — Patient Instructions (Signed)
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
## 2018-06-09 ENCOUNTER — Other Ambulatory Visit: Payer: Self-pay

## 2018-06-09 ENCOUNTER — Encounter: Payer: Self-pay | Admitting: Gastroenterology

## 2018-06-09 ENCOUNTER — Ambulatory Visit (AMBULATORY_SURGERY_CENTER): Payer: Self-pay

## 2018-06-09 VITALS — Ht 73.0 in | Wt 200.6 lb

## 2018-06-09 DIAGNOSIS — Z800 Family history of malignant neoplasm of digestive organs: Secondary | ICD-10-CM

## 2018-06-09 MED ORDER — NA SULFATE-K SULFATE-MG SULF 17.5-3.13-1.6 GM/177ML PO SOLN: 1.0000 | Freq: Once | ORAL | 0 refills | Status: AC

## 2018-06-09 NOTE — Progress Notes (Signed)
No egg or soy allergy known to patient  No issues with past sedation with any surgeries  or procedures, no intubation problems  No diet pills per patient No home 02 use per patient  No blood thinners per patient  Pt denies issues with constipation  No A fib or A flutter  EMMI video sent to pt's e mail , pt declined    

## 2018-06-17 ENCOUNTER — Telehealth: Payer: Self-pay | Admitting: Gastroenterology

## 2018-06-17 NOTE — Telephone Encounter (Signed)
Patient is scheduled for recall colonoscopy on 06/29/18.  Recent diagnosis of prostate problems.  He is asking if it is okay to delay colonoscopy

## 2018-06-17 NOTE — Telephone Encounter (Signed)
pt wants to speak to Dr. Lynne Leader nurse about the possiblity of pushing out colonoscopy due to recent prostate issues.

## 2018-06-20 NOTE — Telephone Encounter (Signed)
Left message for patient to call back  

## 2018-06-20 NOTE — Telephone Encounter (Signed)
OK to reschedule colonoscopy to a later date

## 2018-06-21 NOTE — Telephone Encounter (Signed)
Patient notified He wants to reschedule to late Nov New instructions mailed to him for 08/01/18

## 2018-06-21 NOTE — Telephone Encounter (Signed)
Left message for patient to call back  

## 2018-06-23 DIAGNOSIS — H01022 Squamous blepharitis right lower eyelid: Secondary | ICD-10-CM | POA: Diagnosis not present

## 2018-06-23 DIAGNOSIS — H31092 Other chorioretinal scars, left eye: Secondary | ICD-10-CM | POA: Diagnosis not present

## 2018-06-23 DIAGNOSIS — H01025 Squamous blepharitis left lower eyelid: Secondary | ICD-10-CM | POA: Diagnosis not present

## 2018-06-23 DIAGNOSIS — H01021 Squamous blepharitis right upper eyelid: Secondary | ICD-10-CM | POA: Diagnosis not present

## 2018-06-23 DIAGNOSIS — H01024 Squamous blepharitis left upper eyelid: Secondary | ICD-10-CM | POA: Diagnosis not present

## 2018-06-23 DIAGNOSIS — Z961 Presence of intraocular lens: Secondary | ICD-10-CM | POA: Diagnosis not present

## 2018-06-23 DIAGNOSIS — H43811 Vitreous degeneration, right eye: Secondary | ICD-10-CM | POA: Diagnosis not present

## 2018-06-29 ENCOUNTER — Encounter: Payer: Medicare HMO | Admitting: Gastroenterology

## 2018-07-06 ENCOUNTER — Telehealth: Payer: Self-pay | Admitting: Gastroenterology

## 2018-07-06 DIAGNOSIS — C61 Malignant neoplasm of prostate: Secondary | ICD-10-CM | POA: Diagnosis not present

## 2018-07-06 LAB — PSA: PSA: 0.035

## 2018-07-06 MED ORDER — NA SULFATE-K SULFATE-MG SULF 17.5-3.13-1.6 GM/177ML PO SOLN: 1.0000 | Freq: Once | ORAL | 0 refills | Status: AC

## 2018-07-06 NOTE — Telephone Encounter (Signed)
Patient needs prep sent to his pharmacy, was here in October and needs new instruction mailed to him.

## 2018-07-06 NOTE — Telephone Encounter (Signed)
Patient wants correct instruction letter sent to him for colon that he scheduled for 08/03/2018 and the letter he received is instruction for 12/2. "Does not want it to be all screwed up."

## 2018-07-06 NOTE — Telephone Encounter (Signed)
Called pt and notified I sent pharmacy the script for Suprep and new instructions with the correct date printed and mailed   Johnathan Sosa

## 2018-07-06 NOTE — Telephone Encounter (Signed)
Asking for a call once this has been done please. Ok to leave a Advertising account executive.

## 2018-07-13 DIAGNOSIS — C61 Malignant neoplasm of prostate: Secondary | ICD-10-CM | POA: Diagnosis not present

## 2018-07-13 DIAGNOSIS — N393 Stress incontinence (female) (male): Secondary | ICD-10-CM | POA: Diagnosis not present

## 2018-07-20 ENCOUNTER — Telehealth: Payer: Self-pay

## 2018-07-20 NOTE — Telephone Encounter (Signed)
You may fill this out for him and I will sign-thanks so much.  Can mark that he uses assistive device to walk.

## 2018-07-20 NOTE — Telephone Encounter (Signed)
Called and spoke with patient who denied being dizzy while he drives. He states he experiences the dizziness when he walks especially when changing direction. He has been using a walker to help with balance. He request a Handicap Placard for his car.

## 2018-07-25 ENCOUNTER — Encounter: Payer: Self-pay | Admitting: Family Medicine

## 2018-07-25 ENCOUNTER — Other Ambulatory Visit: Payer: Self-pay | Admitting: Family Medicine

## 2018-07-26 NOTE — Telephone Encounter (Signed)
Form Completed and placed in the mail for patient

## 2018-08-01 ENCOUNTER — Encounter: Payer: Medicare HMO | Admitting: Gastroenterology

## 2018-08-02 ENCOUNTER — Other Ambulatory Visit: Payer: Self-pay | Admitting: Family Medicine

## 2018-08-03 ENCOUNTER — Encounter: Payer: Medicare HMO | Admitting: Gastroenterology

## 2018-08-03 NOTE — Telephone Encounter (Addendum)
Spoke with patient, he advised that he has been taking Escitalopram 10 mg 1/2 tablet daily prescribed by Dr. [name] AND Citalopram 10 mg prescribed by Dr. Brett Fairy. Forwarding to Dr. [name] to advise.   Spoke with [name] and confirmed that it would be OK for this to be addressed tomorrow by Dr. [name].

## 2018-08-03 NOTE — Telephone Encounter (Signed)
Please call and chat with patient/wife to see what he is currently taking. If he feels like he is doing well on either escitalopram 5 mg or citalopram 10mg - im ok with either being sent in

## 2018-08-03 NOTE — Telephone Encounter (Signed)
Called [phone], it appears that this is wife's cell number. Was advised to call pt on other number starting with [phone] to get requested information regarding med change with Celexa/Lexapro.

## 2018-08-03 NOTE — Telephone Encounter (Signed)
Per last OV with Dr. [name] 05/16/18 - remain on current rx (Escitalopram 5 mg)  Per OV note from Dr. Brett Fairy 06/07/18, Escitalopram was increased to 10 mg daily, but it looks like Citalopram 10 mg was actually sent in. Please advise.

## 2018-08-04 MED ORDER — CITALOPRAM HYDROBROMIDE 10 MG PO TABS: 10.0000 mg | ORAL_TABLET | Freq: Every day | ORAL | 3 refills | Status: DC

## 2018-08-04 NOTE — Telephone Encounter (Signed)
They are both low enough doses that technically he can take them together- we still need to hear from Dr. Brett Fairy if this combination is what she intended though- typically I prefer one or the other. Will forward this to her for more information.   I am out sick today but you can still text me - I have my computer up so I can answer questions.

## 2018-08-04 NOTE — Telephone Encounter (Signed)
Dr. [name] is unexpectedly out of the office today. Will forward to Dr. Brett Fairy to advise regarding medication change since change was made at OV with her. Dr. Brett Fairy, please see below regarding Celexa/Lexapro. Thank you.

## 2018-08-04 NOTE — Telephone Encounter (Signed)
I changed Wellbutrin generic form to 2 of 150 mg XR from 1 of 300 mg daily for better pharmacological profile.  I wrote for Celaxa , beginning at 10 mg daily.  All this in response to the neuro-psyhcological evaluation stating severe depression and not dementia at origin of "Cognitive dysfunction".

## 2018-08-08 ENCOUNTER — Telehealth: Payer: Self-pay | Admitting: Neurology

## 2018-08-08 NOTE — Telephone Encounter (Signed)
Called the patient and in looking at the patients chart appears the patient has been on wellbutrin 300 mg once a day in past. The script that was sent in October was 150mg  xl bid. Patient has picked that up but is confused on what he should be taken. Dr [name] ordered the patient's medication in November and he shouldn't be on 2 different strengths of the medication. Also it was mentioned in office note with Dr [name] last visit that the patient would increase lexapro dose. I dont see where that medication is even on file any longer for the patient. On 12/5 celexa was ordered. The patient was getting upset because he is confused on what he needs to be taking and he wants the Dr's to get on the same page. I advised the patient that I will discuss his concerns with Dr Brett Fairy and contact  Him with what she recommends.

## 2018-08-08 NOTE — Telephone Encounter (Signed)
Pt requesting a call stating he is unsure why the rx for buPROPion (WELLBUTRIN SR) 150 MG 12 hr tablet had been sent over for a 45 day prescription. Requesting a call to make sure this is correct

## 2018-08-09 ENCOUNTER — Other Ambulatory Visit: Payer: Self-pay | Admitting: Neurology

## 2018-08-09 MED ORDER — BUPROPION HCL ER (SR) 150 MG PO TB12: 150.0000 mg | ORAL_TABLET | Freq: Two times a day (BID) | ORAL | 3 refills | Status: DC

## 2018-08-09 NOTE — Telephone Encounter (Signed)
Sounds good- thanks for all the hard work Johnathan Sosa!- I am happy to refill both/either

## 2018-08-09 NOTE — Telephone Encounter (Signed)
Called and spoke with the wife. Dr [name] and I had discussed and looked over the patient's medication list. Dr Brett Fairy states she had already been in contact with the patient's PCP  Dr [name] and they reviewed the medication. Dr [name] states that she would like the patient to take the 150 mg SR twice a day as it absorbs differently then the once a day wellbutrin 300 mg SR. I will DC the 300 mg SR on file and place the order to Wellbutin 150 mg SR BID for the patient to take going forward. Dr [name] states that Dr [name] was agreeable to this. If so she would ask that he continue refills for the patient for it. She reviewed his concern about the lexapro. On 12/5  Dr [name] ordered celexa 10 mg once a day. Dr [name] agrees that celexa is ok to take as it is in the same class as the lexapro. She states that patient should not take both only the one and since most recent order is celexa she would state the patient should continue that. I advised the wife of all this and informed her that I would send to Dr [name] as well just so that he is aware of the change on file and so that everyone is in the loop. Patients wife verbalized understanding and was appreciative for the call.

## 2018-08-18 ENCOUNTER — Encounter: Payer: Self-pay | Admitting: Gastroenterology

## 2018-08-22 ENCOUNTER — Telehealth: Payer: Self-pay | Admitting: Family Medicine

## 2018-08-22 NOTE — Telephone Encounter (Signed)
See note  Copied from [place] [id number]. Topic: General - Other >> Aug 22, 2018 11:41 AM Leward Quan A wrote: Reason for CRM: Patient called to say that he has misplaced his Handicapped Placard form application that was filled out and signed by Dr [name]. Patient  would like to know if there is a copy on file can someone please print it and send him a copy so that he can take it to the [facility] to obtain the placard. Please assist,  Patient phone # [phone]

## 2018-09-02 ENCOUNTER — Encounter: Payer: Medicare HMO | Admitting: Gastroenterology

## 2018-09-08 NOTE — Telephone Encounter (Signed)
Form completed and will be mailed to patient

## 2018-09-22 ENCOUNTER — Encounter: Payer: Medicare HMO | Admitting: Gastroenterology

## 2018-10-03 ENCOUNTER — Other Ambulatory Visit: Payer: Self-pay | Admitting: Family Medicine

## 2018-10-12 ENCOUNTER — Encounter: Payer: Medicare HMO | Admitting: Gastroenterology

## 2018-10-26 ENCOUNTER — Other Ambulatory Visit: Payer: Self-pay | Admitting: Family Medicine

## 2018-10-26 ENCOUNTER — Encounter: Payer: Medicare HMO | Admitting: Gastroenterology

## 2018-11-03 ENCOUNTER — Ambulatory Visit (AMBULATORY_SURGERY_CENTER): Payer: Self-pay

## 2018-11-03 VITALS — Ht 73.5 in | Wt 208.0 lb

## 2018-11-03 DIAGNOSIS — Z800 Family history of malignant neoplasm of digestive organs: Secondary | ICD-10-CM

## 2018-11-03 MED ORDER — NA SULFATE-K SULFATE-MG SULF 17.5-3.13-1.6 GM/177ML PO SOLN: 1.0000 | Freq: Once | ORAL | 0 refills | Status: AC

## 2018-11-03 NOTE — Progress Notes (Signed)
Per pt, no allergies to soy or egg products.Pt not taking any weight loss meds or using  O2 at home.  Pt refused emmi video. 

## 2018-11-04 ENCOUNTER — Encounter: Payer: Self-pay | Admitting: Gastroenterology

## 2018-11-11 ENCOUNTER — Ambulatory Visit (INDEPENDENT_AMBULATORY_CARE_PROVIDER_SITE_OTHER): Payer: Medicare HMO | Admitting: Family Medicine

## 2018-11-11 ENCOUNTER — Other Ambulatory Visit: Payer: Self-pay

## 2018-11-11 ENCOUNTER — Ambulatory Visit: Payer: Medicare HMO | Admitting: Family Medicine

## 2018-11-11 ENCOUNTER — Encounter: Payer: Self-pay | Admitting: Family Medicine

## 2018-11-11 VITALS — BP 123/81 | HR 60 | Temp 97.6°F | Ht 73.5 in | Wt 206.8 lb

## 2018-11-11 DIAGNOSIS — E785 Hyperlipidemia, unspecified: Secondary | ICD-10-CM

## 2018-11-11 DIAGNOSIS — C61 Malignant neoplasm of prostate: Secondary | ICD-10-CM

## 2018-11-11 DIAGNOSIS — M10079 Idiopathic gout, unspecified ankle and foot: Secondary | ICD-10-CM

## 2018-11-11 DIAGNOSIS — I10 Essential (primary) hypertension: Secondary | ICD-10-CM | POA: Diagnosis not present

## 2018-11-11 DIAGNOSIS — F324 Major depressive disorder, single episode, in partial remission: Secondary | ICD-10-CM | POA: Diagnosis not present

## 2018-11-11 LAB — LIPID PANEL
Cholesterol: 147 mg/dL (ref 0–200)
HDL: 33.3 mg/dL — AB (ref >39.00)
NONHDL: 113.98
TRIGLYCERIDES: 235 mg/dL — AB (ref 0.0–149.0)
Total CHOL/HDL Ratio: 4
VLDL: 47 mg/dL — ABNORMAL HIGH (ref 0.0–40.0)

## 2018-11-11 LAB — COMPREHENSIVE METABOLIC PANEL
ALT: 30 U/L (ref 0–53)
AST: 19 U/L (ref 0–37)
Albumin: 4.4 g/dL (ref 3.5–5.2)
Alkaline Phosphatase: 55 U/L (ref 39–117)
BUN: 21 mg/dL (ref 6–23)
CO2: 26 mEq/L (ref 19–32)
Calcium: 9.8 mg/dL (ref 8.4–10.5)
Chloride: 105 mEq/L (ref 96–112)
Creatinine, Ser: 1.19 mg/dL (ref 0.40–1.50)
GFR: 60.35 mL/min (ref >60.00)
GLUCOSE: 84 mg/dL (ref 70–99)
POTASSIUM: 4.5 meq/L (ref 3.5–5.1)
Sodium: 139 mEq/L (ref 135–145)
TOTAL PROTEIN: 6.4 g/dL (ref 6.0–8.3)
Total Bilirubin: 0.5 mg/dL (ref 0.2–1.2)

## 2018-11-11 LAB — LDL CHOLESTEROL, DIRECT: Direct LDL: 93 mg/dL

## 2018-11-11 LAB — URIC ACID: Uric Acid, Serum: 4.6 mg/dL (ref 4.0–7.8)

## 2018-11-11 LAB — CBC
HCT: 43.8 % (ref 39.0–52.0)
Hemoglobin: 15.4 g/dL (ref 13.0–17.0)
MCHC: 35.2 g/dL (ref 30.0–36.0)
MCV: 89.1 fl (ref 78.0–100.0)
PLATELETS: 213 10*3/uL (ref 150.0–400.0)
RBC: 4.92 Mil/uL (ref 4.22–5.81)
RDW: 12.8 % (ref 11.5–15.5)
WBC: 9.1 10*3/uL (ref 4.0–10.5)

## 2018-11-11 NOTE — Progress Notes (Signed)
Phone [phone]   Subjective:  Johnathan Sosa is a 73 y.o. year old very pleasant male patient who presents for/with See problem oriented charting ROS- No chest pain or shortness of breath. No headache or blurry vision.    Past Medical History-  Patient Active Problem List   Diagnosis Date Noted  . Unintentional weight loss 12/21/2017    Priority: High  . Prostate cancer ([place]) 07/13/2017    Priority: High  . History of CVA (cerebrovascular accident) 07/20/2016    Priority: High  . Closed TBI (traumatic brain injury) ([place]) 10/24/2014    Priority: High  . 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 Memory loss     Priority: High  . Attention deficit hyperactivity disorder (ADHD) 07/06/2016    Priority: Medium  . Gout     Priority: Medium  . Insomnia     Priority: Medium  . Hyperlipidemia     Priority: Medium  . Depression, major, single episode, in partial remission ([place])     Priority: Medium  . HTN (hypertension) 04/29/2012    Priority: Medium  . Former smoker 10/01/2015    Priority: Low  . Erectile dysfunction 10/01/2015    Priority: Low  . Migraine headache     Priority: Low  . Abnormal eye movements 10/24/2014    Priority: Low  . 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 GERD (gastroesophageal reflux disease)     Priority: Low  . Excessive daytime sleepiness 06/07/2018  . MCI (mild cognitive impairment) 06/07/2018  . Fatigue due to depression 06/07/2018  . Aortic atherosclerosis ([place]) 12/21/2017  . Ileus ([place]) 09/04/2017  . 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 BPPV (benign paroxysmal positional vertigo) 12/21/2016  . Screening for prostate cancer 10/16/2016  . Palpitations 07/20/2016    Medications- reviewed and updated Current Outpatient Medications  Medication Sig Dispense Refill  . allopurinol (ZYLOPRIM) 300 MG tablet TAKE 1 TABLET EVERY DAY 90 tablet 3  . ALPRAZolam (XANAX) 0.25  MG tablet BID prn. TAKE BY MOUTH AS NEEDED FOR VERTIGO. DO NOT EXCEED 2 TABS A DAY. 60 tablet 1  . amLODipine (NORVASC) 2.5 MG tablet Take 1 tablet (2.5 mg total) by mouth daily. 90 tablet 3  . atorvastatin (LIPITOR) 10 MG tablet TAKE 1 TABLET EVERY DAY (Patient not taking: Reported on 11/03/2018) 90 tablet 0  . buPROPion (WELLBUTRIN SR) 150 MG 12 hr tablet Take 1 tablet (150 mg total) by mouth 2 (two) times daily. (Patient taking differently: Take 150 mg by mouth daily. ) 180 tablet 3  . citalopram (CELEXA) 10 MG tablet Take 1 tablet (10 mg total) by mouth daily. (Patient taking differently: Take 10 mg by mouth daily. Take 1/2 pill daily) 90 tablet 3  . clonazePAM (KLONOPIN) 1 MG tablet TAKE 1 TABLET BY MOUTH AT BEDTIME AS NEEDED (Patient taking differently: Take 1/2 -1 pill at bedtime) 30 tablet 5   No current facility-administered medications for this visit.      Objective:  BP 123/81 (BP Location: Right Arm, Patient Position: Sitting, Cuff Size: Large)   Pulse 60   Temp 97.6 F (36.4 C) (Oral)   Ht 6' 1.5" (1.867 m)   Wt 206 lb 12.8 oz (93.8 kg)   SpO2 97%   BMI 26.91 kg/m  Gen: NAD, resting comfortably CV: RRR no murmurs rubs or gallops Lungs: CTAB no crackles, wheeze, rhonchi Abdomen: soft/nontender/nondistended Ext: no edema Skin:  warm, dry Neuro: stands slowly, normal speech    Assessment and Plan   #Depression, major, single episode, in partial remission  S: PHQ 9 remains partially elevated at 7.  We had considered reducing Wellbutrin due to weight loss but since he has gained weight we will hold off on any modifications.  Patient is currently on Celexa 10 mg as well..no SI  A/P:mild poor control- patient does not want to make changes. Last wellbutrin was SR by Dr. Maureen Chatters- he would like to remain on current dose and not change to extended release.   #Gout S: Compliant with allopurinol 300 mg.  No recent gout flares.   A/P:   Stable. Continue current medications.     #Hypertension S: controlled on amlodipine 2.5 mg A/P:  Stable. Continue current medications.     #Hyperlipidemia S: Reasonably controlled on last check-compliant with atorvastatin 10 mg A/P: Stable on last check-update lipid panel  #prostate cancer follow up- has seen urology- psa has remained at 0. Continue active monitoring.    Other notes: 1.Balance issues/vertigo largely stable- alprazolam through neurology- he states not using twice a day most days-we also have him on clonazepam for sleep-he simply does not sleep well at all without this- have warned multiple times about risks of alprazolam and clonazepam together.  Likely no recent falls.  No signs of oversedation 2.  Unintentional weight loss has stabilized.  Unfortunately he never followed through with his colonoscopy-advised him to do this again today  Future Appointments  Date Time Provider [place]  11/15/2018 10:30 AM Ladene Artist, MD [facility]  02/06/2019  1:30 PM [name], Asencion Partridge, MD [facility] None   Return in about 6 months (around 05/14/2019) for physical.  Lab/Order associations: 4 eggs today Essential hypertension - Plan: CBC, Lipid panel, Comprehensive metabolic panel  Idiopathic gout of foot, unspecified chronicity, unspecified laterality - Plan: Uric acid  Hyperlipidemia, unspecified hyperlipidemia type  Prostate cancer ([place])  Return precautions advised.  [name], MD

## 2018-11-11 NOTE — Patient Instructions (Addendum)
Health Maintenance Due  Topic Date Due  . COLONOSCOPY - thanks for scheduling this 03/15/2017   Please stop by lab before you go If you do not have mychart- we will call you about results within 5 business days of [place] receiving them.  If you have mychart- we will send your results within 3 business days of [place] receiving them.  If abnormal or we want to clarify a result, we will call or mychart you to make sure you receive the message.  If you have questions or concerns or don't hear within 5-7 days, please send [place] a message or call us.   Glad you are doing better!

## 2018-11-14 ENCOUNTER — Telehealth: Payer: Self-pay | Admitting: Gastroenterology

## 2018-11-14 ENCOUNTER — Telehealth: Payer: Self-pay | Admitting: Family Medicine

## 2018-11-14 ENCOUNTER — Telehealth: Payer: Self-pay | Admitting: *Deleted

## 2018-11-14 NOTE — Telephone Encounter (Signed)
See note

## 2018-11-14 NOTE — Telephone Encounter (Signed)
Pt given lab results and verbalized understanding.

## 2018-11-14 NOTE — Telephone Encounter (Signed)
Patient's wife is calling back to receive lab results. Please advise [phone]

## 2018-11-14 NOTE — Telephone Encounter (Signed)
OK 

## 2018-11-14 NOTE — Telephone Encounter (Signed)
Copied from [place] [id number]. Topic: Quick Communication - See Telephone Encounter >> Nov 14, 2018 12:38 PM Bea Graff, NT wrote: CRM for notification. See Telephone encounter for: 11/14/18. Pts wife calling back to receive lab results. Please advise.

## 2018-11-14 NOTE — Telephone Encounter (Signed)
Covid-19 travel screening questions  Have you traveled in the last 14 days? no If yes where?  Do you now or have you had a fever in the last 14 days? no  Do you have any respiratory symptoms of shortness of breath or cough now or in the last 14 days? no  Do you have any family members or close contacts with diagnosed or suspected Covid-19? no       

## 2018-11-15 ENCOUNTER — Encounter: Payer: Medicare HMO | Admitting: Gastroenterology

## 2018-12-16 ENCOUNTER — Encounter: Payer: Medicare HMO | Admitting: Gastroenterology

## 2018-12-26 ENCOUNTER — Other Ambulatory Visit: Payer: Self-pay | Admitting: Family Medicine

## 2019-01-02 ENCOUNTER — Telehealth: Payer: Self-pay | Admitting: *Deleted

## 2019-01-02 NOTE — Telephone Encounter (Signed)
Called patient to reschedule colonoscopy previously canceled due to Covid-19. Rescheduled to 01/16/19 200 pm. Pt has prep, instructions redone and mailed to patient.

## 2019-01-04 ENCOUNTER — Other Ambulatory Visit: Payer: Self-pay | Admitting: Family Medicine

## 2019-01-04 DIAGNOSIS — S069X9A Unspecified intracranial injury with loss of consciousness of unspecified duration, initial encounter: Principal | ICD-10-CM

## 2019-01-04 DIAGNOSIS — R42 Dizziness and giddiness: Secondary | ICD-10-CM

## 2019-01-04 DIAGNOSIS — S069XAA Unspecified intracranial injury with loss of consciousness status unknown, initial encounter: Secondary | ICD-10-CM

## 2019-01-04 NOTE — Telephone Encounter (Signed)
Does not look like I am the prescribing doctor for this medication-see last prescriber

## 2019-01-04 NOTE — Telephone Encounter (Signed)
Rx refill  Last ov 11/11/18 Alprazolam 0.25mg  Last fill 06/07/18  #60/1

## 2019-01-10 ENCOUNTER — Telehealth: Payer: Self-pay

## 2019-01-10 ENCOUNTER — Encounter: Payer: Self-pay | Admitting: Family Medicine

## 2019-01-10 ENCOUNTER — Ambulatory Visit (INDEPENDENT_AMBULATORY_CARE_PROVIDER_SITE_OTHER): Payer: Medicare HMO | Admitting: Family Medicine

## 2019-01-10 VITALS — BP 130/80 | Ht 73.5 in | Wt 210.0 lb

## 2019-01-10 DIAGNOSIS — R5383 Other fatigue: Secondary | ICD-10-CM | POA: Diagnosis not present

## 2019-01-10 DIAGNOSIS — I10 Essential (primary) hypertension: Secondary | ICD-10-CM

## 2019-01-10 DIAGNOSIS — E785 Hyperlipidemia, unspecified: Secondary | ICD-10-CM

## 2019-01-10 DIAGNOSIS — F324 Major depressive disorder, single episode, in partial remission: Secondary | ICD-10-CM

## 2019-01-10 NOTE — Patient Instructions (Addendum)
Health Maintenance Due  Topic Date Due  . COLONOSCOPY  Has an appointment next Monday. 03/15/2017    Depression screen PHQ 2/9 11/11/2018 05/16/2018 02/03/2018  Decreased Interest 1 1 1   Down, Depressed, Hopeless 1 1 1   PHQ - 2 Score 2 2 2   Altered sleeping 0 0 0  Tired, decreased energy 2 2 1   Change in appetite 0 0 1  Feeling bad or failure about yourself  0 0 1  Trouble concentrating 0 0 0  Moving slowly or fidgety/restless 3 2 1   Suicidal thoughts 0 0 0  PHQ-9 Score 7 6 6   Difficult doing work/chores Very difficult Somewhat difficult -   Phone visit with Al

## 2019-01-10 NOTE — Progress Notes (Signed)
Phone [phone]   Subjective:  Virtual visit via phonenote Chief Complaint  Patient presents with  . Fatigue   This visit type was conducted due to national recommendations for restrictions regarding the COVID-19 Pandemic (e.g. social distancing).  This format is felt to be most appropriate for this patient at this time balancing risks to patient and risks to population by having him in for in person visit.  All issues noted in this document were discussed and addressed.  No physical exam was performed (except for noted visual exam or audio findings with Telehealth visits).  The patient has consented to conduct a Telehealth visit and understands insurance will be billed.   Our team/I connected with Johnathan Sosa on 01/10/19 at  3:40 PM EDT by phone (patient did not have equipment for webex) and verified that I am speaking with the correct person using two identifiers.  Location patient: [facility] Location provider: [facility], office Persons participating in the virtual visit:  Patient and wife  Time on phone: 30 minutes Counseling provided about covid 19, depression  Our team/I discussed the limitations of evaluation and management by telemedicine and the availability of in person appointments. In light of current covid-19 pandemic, patient also understands that we are trying to protect them by minimizing in office contact if at all possible.  The patient expressed consent for telemedicine visit and agreed to proceed. Patient understands insurance will be billed.   ROS- patient denies chest pain, shortness of breath, edema. Continued dizziness noted.    Past Medical History-  Patient Active Problem List   Diagnosis Date Noted  . Unintentional weight loss 12/21/2017    Priority: High  . Prostate cancer ([place]) 07/13/2017    Priority: High  . History of CVA (cerebrovascular accident) 07/20/2016    Priority: High  . Closed TBI (traumatic brain injury) ([place]) 10/24/2014    Priority: High   . 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 Memory loss     Priority: High  . Attention deficit hyperactivity disorder (ADHD) 07/06/2016    Priority: Medium  . Gout     Priority: Medium  . Insomnia     Priority: Medium  . Hyperlipidemia     Priority: Medium  . Depression, major, single episode, in partial remission ([place])     Priority: Medium  . HTN (hypertension) 04/29/2012    Priority: Medium  . Former smoker 10/01/2015    Priority: Low  . Erectile dysfunction 10/01/2015    Priority: Low  . Migraine headache     Priority: Low  . Abnormal eye movements 10/24/2014    Priority: Low  . 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 GERD (gastroesophageal reflux disease)     Priority: Low  . Excessive daytime sleepiness 06/07/2018  . MCI (mild cognitive impairment) 06/07/2018  . Fatigue due to depression 06/07/2018  . Aortic atherosclerosis ([place]) 12/21/2017  . Ileus ([place]) 09/04/2017  . 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 BPPV (benign paroxysmal positional vertigo) 12/21/2016  . Screening for prostate cancer 10/16/2016  . Palpitations 07/20/2016    Medications- reviewed and updated Current Outpatient Medications  Medication Sig Dispense Refill  . allopurinol (ZYLOPRIM) 300 MG tablet TAKE 1 TABLET EVERY DAY 90 tablet 3  . ALPRAZolam (XANAX) 0.25 MG tablet BID prn. TAKE BY MOUTH AS NEEDED FOR VERTIGO. DO NOT EXCEED 2 TABS A DAY. 60 tablet 1  . amLODipine (NORVASC)  2.5 MG tablet Take 1 tablet (2.5 mg total) by mouth daily. 90 tablet 3  . atorvastatin (LIPITOR) 10 MG tablet TAKE 1 TABLET EVERY DAY 90 tablet 0  . buPROPion (WELLBUTRIN SR) 150 MG 12 hr tablet Take 1 tablet (150 mg total) by mouth 2 (two) times daily. (Patient taking differently: Take 150 mg by mouth daily. ) 180 tablet 3  . citalopram (CELEXA) 10 MG tablet Take 1 tablet (10 mg total) by mouth daily. (Patient taking differently: Take 10 mg by  mouth daily. Take 1/2 pill daily) 90 tablet 3  . clonazePAM (KLONOPIN) 1 MG tablet TAKE 1 TABLET BY MOUTH AT BEDTIME AS NEEDED (Patient taking differently: Take 1/2 -1 pill at bedtime) 30 tablet 5   No current facility-administered medications for this visit.      Objective:  BP 130/80   Ht 6' 1.5" (1.867 m)   Wt 210 lb (95.3 kg)   BMI 27.33 kg/m  self reported vitals  Nonlabored voice, normal speech      Assessment and Plan   # Fatigue S:patient seems to be tired all the time. Eats breakfast and goes back to bed. He states always has been this way. Going back to get PSA on Friday to make sure not going up. Has been dealing with abscess in his tooth for months. 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 Wife states he is not doing a good job staying hydrated.    Taking celexa half tablet of 10mg  and only one wellbutrin per day. May be worsening depression vs situational with covid 19  History of unintentional weight loss in past- had extensive workup but now weght is going up. Per wife- eating fair amount of sugars and sweets  A/P: fatigue reported- with patient taking 2 naps per day in addition to sleeping at night. Patient admits that covid 19 has not been the easiest on him- perhaps worsening of depression through this situation- advised him to take full 10 mg citalopram and 300mg  of wellbutrin again- he will consider -wife also worried about cancer risks (encouraged patient to follow through with urology psa check and also follow through with colonoscopy - have done multiple rounds of labs lately- he declines repeat for now  (last done in march) - wife reports snoring- considered sleep apnea testing but patient reports negative sleep apnea testing 10 years ago and he was at heavier weight at that time. Could still consider if symptoms do not improve  #depression- see above- encouraged full compliance with mediation  #HLD- stopped lipid medication encouraged restart   #hypertension- controlled on amlodipine alone- continue current rx  Future Appointments  Date Time Provider [place]  01/16/2019  2:00 PM Ladene Artist, MD [facility]  02/06/2019  1:30 PM [name], Asencion Partridge, MD [facility] None   Lab/Order associations: Fatigue, unspecified type  Essential hypertension  Hyperlipidemia, unspecified hyperlipidemia type  Depression, major, single episode, in partial remission ([place])  Return precautions advised.  [name], MD

## 2019-01-10 NOTE — Telephone Encounter (Signed)
Patient sent the office a Mychart message in behalf of her spouse. Per Mychart message   "Schedule visit with patient and his wife.   Johnathan Sosa          8:21 AM  Johnathan Sosa, LAT routed this conversation to Marin Olp, MD  Jan 08, 2019  Johnathan Shy "LIZ"  to Marin Olp, MD        10:46 PM  This is in regards to Johnathan Sosa's health and I am very concerned  He is constantly fatigued. Even after a good night sleep, he can eat breakfast and within a couple if hours be exhausted and falls asleep. This has gone on a long time. Yesterday, he tells me he has had an abcess  In his mouth by the upper bicuspid. He said he had been irrigating the inflammation. He went to the dentist on Friday. He put him on an antibiotic and is set to go back for a crown prep tomorrow. 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 This collection has been going on for a very long time like years. He has 2500 pounds.  He said to me tonight that he hopes this is not the way the rest of his life will be.  Any suggestions? I do have Johnathan Sosa's health care poa. He asked me to call you Monday. I know this is the best way to communicate with you. He is scheduled for colonoscopy May 18. He has cancelled this procedure more times than I could count.  I would appreciate any help.  Many thanks.  Johnathan Sosa "    Patient has been scheduled today for the issue.

## 2019-01-13 ENCOUNTER — Telehealth: Payer: Self-pay | Admitting: *Deleted

## 2019-01-13 DIAGNOSIS — C61 Malignant neoplasm of prostate: Secondary | ICD-10-CM | POA: Diagnosis not present

## 2019-01-13 NOTE — Telephone Encounter (Signed)
Covid-19 travel screening questions  Have you traveled in the last 14 days? no If yes where?  Do you now or have you had a fever in the last 14 days? no  Do you have any respiratory symptoms of shortness of breath or cough now or in the last 14 days? no  Do you have any family members or close contacts with diagnosed or suspected Covid-19? No  Pt is aware that care partner will be waiting in car during procedure.  He will wear a mask into building

## 2019-01-16 ENCOUNTER — Other Ambulatory Visit: Payer: Self-pay

## 2019-01-16 ENCOUNTER — Ambulatory Visit (AMBULATORY_SURGERY_CENTER): Payer: Medicare HMO | Admitting: Gastroenterology

## 2019-01-16 ENCOUNTER — Encounter: Payer: Self-pay | Admitting: Gastroenterology

## 2019-01-16 VITALS — BP 101/81 | HR 61 | Temp 98.1°F | Resp 19 | Ht 73.5 in | Wt 210.0 lb

## 2019-01-16 DIAGNOSIS — D123 Benign neoplasm of transverse colon: Secondary | ICD-10-CM

## 2019-01-16 DIAGNOSIS — Z800 Family history of malignant neoplasm of digestive organs: Secondary | ICD-10-CM | POA: Diagnosis not present

## 2019-01-16 DIAGNOSIS — Z1211 Encounter for screening for malignant neoplasm of colon: Secondary | ICD-10-CM | POA: Diagnosis not present

## 2019-01-16 MED ORDER — SODIUM CHLORIDE 0.9 % IV SOLN: 500.0000 mL | Freq: Once | INTRAVENOUS | Status: DC

## 2019-01-16 NOTE — Progress Notes (Signed)
Patient difficult to fully arouse.  Wife at bedside assisting.  States husband had head injury 7 years ago and since that time has neurological changes including chronically sleepy.  Sees neurologist, possible early dementia as well.  Denies sleep disorder.

## 2019-01-16 NOTE — Progress Notes (Signed)
To PACU, VSS. Report to [email or web address] 

## 2019-01-16 NOTE — Patient Instructions (Addendum)
  Thank you for allowing [place] to care for you today!  Await pathology results by mail, approximately 2 weeks.  Recommend next surveillance colonoscopy in 5 years.  Return to regular diet and medications today.  Return to normal activities tomorrow.        YOU HAD AN ENDOSCOPIC PROCEDURE TODAY AT [facility]:   Refer to the procedure report that was given to you for any specific questions about what was found during the examination.  If the procedure report does not answer your questions, please call your gastroenterologist to clarify.  If you requested that your care partner not be given the details of your procedure findings, then the procedure report has been included in a sealed envelope for you to review at your convenience later.  YOU SHOULD EXPECT: Some feelings of bloating in the abdomen. Passage of more gas than usual.  Walking can help get rid of the air that was put into your GI tract during the procedure and reduce the bloating. If you had a lower endoscopy (such as a colonoscopy or flexible sigmoidoscopy) you may notice spotting of blood in your stool or on the toilet paper. If you underwent a bowel prep for your procedure, you may not have a normal bowel movement for a few days.  Please Note:  You might notice some irritation and congestion in your nose or some drainage.  This is from the oxygen used during your procedure.  There is no need for concern and it should clear up in a day or so.  SYMPTOMS TO REPORT IMMEDIATELY:   Following lower endoscopy (colonoscopy or flexible sigmoidoscopy):  Excessive amounts of blood in the stool  Significant tenderness or worsening of abdominal pains  Swelling of the abdomen that is new, acute  Fever of 100F or higher   For urgent or emergent issues, a gastroenterologist can be reached at any hour by calling [phone].   DIET:  We do recommend a small meal at first, but then you may proceed to your regular diet.   Drink plenty of fluids but you should avoid alcoholic beverages for 24 hours.  ACTIVITY:  You should plan to take it easy for the rest of today and you should NOT DRIVE or use heavy machinery until tomorrow (because of the sedation medicines used during the test).    FOLLOW UP: Our staff will call the number listed on your records 48-72 hours following your procedure to check on you and address any questions or concerns that you may have regarding the information given to you following your procedure. If we do not reach you, we will leave a message.  We will attempt to reach you two times.  During this call, we will ask if you have developed any symptoms of COVID 19. If you develop any symptoms (for example fever, flu-like symptoms, shortness of breath, cough etc.) before then, please call [phone].  If any biopsies were taken you will be contacted by phone or by letter within the next 1-3 weeks.  Please call us at [phone] if you have not heard about the biopsies in 3 weeks.    SIGNATURES/CONFIDENTIALITY: You and/or your care partner have signed paperwork which will be entered into your electronic medical record.  These signatures attest to the fact that that the information above on your After Visit Summary has been reviewed and is understood.  Full responsibility of the confidentiality of this discharge information lies with you and/or your care-partner.

## 2019-01-16 NOTE — Op Note (Signed)
[facility] Patient Name: Willow Reczek Procedure Date: 01/16/2019 2:07 PM MRN: [id number] Endoscopist: Ladene Artist , MD Age: 73 Referring MD:  Date of Birth: [birth year] Gender: Male Account #: [id number] Procedure:                Colonoscopy Indications:              Screening in patient at increased risk: Family                            history of 1st-degree relative with colorectal                            cancer Medicines:                Monitored Anesthesia Care Procedure:                Pre-Anesthesia Assessment:                           - Prior to the procedure, a History and Physical                            was performed, and patient medications and                            allergies were reviewed. The patient's tolerance of                            previous anesthesia was also reviewed. The risks                            and benefits of the procedure and the sedation                            options and risks were discussed with the patient.                            All questions were answered, and informed consent                            was obtained. Prior Anticoagulants: The patient has                            taken no previous anticoagulant or antiplatelet                            agents. ASA Grade Assessment: II - A patient with                            mild systemic disease. After reviewing the risks                            and benefits, the patient was deemed in  satisfactory condition to undergo the procedure.                           After obtaining informed consent, the colonoscope                            was passed under direct vision. Throughout the                            procedure, the patient's blood pressure, pulse, and                            oxygen saturations were monitored continuously. The                            Colonoscope was introduced through the anus and               advanced to the the cecum, identified by                            appendiceal orifice and ileocecal valve. The                            ileocecal valve, appendiceal orifice, and rectum                            were photographed. The quality of the bowel                            preparation was adequate. The colonoscopy was                            performed without difficulty. The patient tolerated                            the procedure well. Scope In: 2:12:52 PM Scope Out: 2:31:30 PM Scope Withdrawal Time: 0 hours 14 minutes 41 seconds  Total Procedure Duration: 0 hours 18 minutes 38 seconds  Findings:                 The perianal and digital rectal examinations were                            normal.                           A 5 mm polyp was found in the transverse colon. The                            polyp was sessile. The polyp was removed with a                            cold snare. Resection and retrieval were complete.  Internal hemorrhoids were found during                            retroflexion. The hemorrhoids were moderate and                            Grade I (internal hemorrhoids that do not prolapse).                           The exam was otherwise without abnormality on                            direct and retroflexion views. Complications:            No immediate complications. Estimated blood loss:                            None. Estimated Blood Loss:     Estimated blood loss: none. Impression:               - One 5 mm polyp in the transverse colon, removed                            with a cold snare. Resected and retrieved.                           - Internal hemorrhoids.                           - The examination was otherwise normal on direct                            and retroflexion views. Recommendation:           - Repeat colonoscopy in 5 years for screening                            purposes.                            - Patient has a contact number available for                            emergencies. The signs and symptoms of potential                            delayed complications were discussed with the                            patient. Return to normal activities tomorrow.                            Written discharge instructions were provided to the                            patient.                           -  Resume previous diet.                           - Continue present medications.                           - Await pathology results. Ladene Artist, MD 01/16/2019 2:34:07 PM This report has been signed electronically.

## 2019-01-16 NOTE — Progress Notes (Signed)
Called to room to assist during endoscopic procedure.  Patient ID and intended procedure confirmed with present staff. Received instructions for my participation in the procedure from the performing physician.  

## 2019-01-18 ENCOUNTER — Telehealth: Payer: Self-pay

## 2019-01-18 NOTE — Telephone Encounter (Signed)
  Follow up Call-  Call back number 01/16/2019  Post procedure Call Back phone  # [phone]  Permission to leave phone message Yes  Some recent data might be hidden     Patient questions:  Do you have a fever, pain , or abdominal swelling? Yes.   Pain Score  0 *  Have you tolerated food without any problems? Yes.    Have you been able to return to your normal activities? Yes.    Do you have any questions about your discharge instructions: Diet   No. Medications  No. Follow up visit  No.  Do you have questions or concerns about your Care? No.  Actions: * If pain score is 4 or above: No action needed, pain <4.  1. Have you developed a fever since your procedure? no  2.   Have you had an respiratory symptoms (SOB or cough) since your procedure? no  3.   Have you tested positive for COVID 19 since your procedure no  3.   Have you had any family members/close contacts diagnosed with the COVID 19 since your procedure?  no   If any of these questions are a yes, please inquire if patient has been seen by family doctor and route this note to Joylene John, Therapist, sports.

## 2019-01-18 NOTE — Telephone Encounter (Signed)
First attempt follow up call made to pt, left message for pt to call if problems or we will try back after noon again.

## 2019-01-20 DIAGNOSIS — C61 Malignant neoplasm of prostate: Secondary | ICD-10-CM | POA: Diagnosis not present

## 2019-01-20 DIAGNOSIS — N393 Stress incontinence (female) (male): Secondary | ICD-10-CM | POA: Diagnosis not present

## 2019-01-23 ENCOUNTER — Other Ambulatory Visit: Payer: Self-pay | Admitting: Neurology

## 2019-01-23 DIAGNOSIS — S069XAA Unspecified intracranial injury with loss of consciousness status unknown, initial encounter: Secondary | ICD-10-CM

## 2019-01-23 DIAGNOSIS — R42 Dizziness and giddiness: Secondary | ICD-10-CM

## 2019-01-24 ENCOUNTER — Encounter: Payer: Self-pay | Admitting: Gastroenterology

## 2019-01-25 ENCOUNTER — Encounter: Payer: Self-pay | Admitting: Family Medicine

## 2019-01-25 DIAGNOSIS — Z86010 Personal history of colonic polyps: Secondary | ICD-10-CM | POA: Insufficient documentation

## 2019-02-06 ENCOUNTER — Encounter: Payer: Self-pay | Admitting: Neurology

## 2019-02-06 ENCOUNTER — Other Ambulatory Visit: Payer: Self-pay

## 2019-02-06 ENCOUNTER — Ambulatory Visit (INDEPENDENT_AMBULATORY_CARE_PROVIDER_SITE_OTHER): Payer: Medicare HMO | Admitting: Neurology

## 2019-02-06 VITALS — BP 122/75 | HR 83 | Temp 97.1°F | Ht 73.5 in | Wt 206.0 lb

## 2019-02-06 DIAGNOSIS — S069X9A Unspecified intracranial injury with loss of consciousness of unspecified duration, initial encounter: Secondary | ICD-10-CM | POA: Diagnosis not present

## 2019-02-06 DIAGNOSIS — R296 Repeated falls: Secondary | ICD-10-CM | POA: Insufficient documentation

## 2019-02-06 DIAGNOSIS — G4719 Other hypersomnia: Secondary | ICD-10-CM

## 2019-02-06 DIAGNOSIS — I700 Atherosclerosis of aorta: Secondary | ICD-10-CM | POA: Diagnosis not present

## 2019-02-06 DIAGNOSIS — R42 Dizziness and giddiness: Secondary | ICD-10-CM | POA: Diagnosis not present

## 2019-02-06 DIAGNOSIS — G3184 Mild cognitive impairment, so stated: Secondary | ICD-10-CM | POA: Insufficient documentation

## 2019-02-06 DIAGNOSIS — G4710 Hypersomnia, unspecified: Secondary | ICD-10-CM | POA: Insufficient documentation

## 2019-02-06 DIAGNOSIS — S069XAA Unspecified intracranial injury with loss of consciousness status unknown, initial encounter: Secondary | ICD-10-CM

## 2019-02-06 NOTE — Patient Instructions (Signed)
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
## 2019-02-23 ENCOUNTER — Other Ambulatory Visit: Payer: Self-pay | Admitting: Family Medicine

## 2019-02-27 ENCOUNTER — Other Ambulatory Visit: Payer: Self-pay | Admitting: Family Medicine

## 2019-03-25 ENCOUNTER — Other Ambulatory Visit: Payer: Self-pay | Admitting: Neurology

## 2019-03-25 DIAGNOSIS — R42 Dizziness and giddiness: Secondary | ICD-10-CM

## 2019-04-01 ENCOUNTER — Other Ambulatory Visit: Payer: Self-pay | Admitting: Family Medicine

## 2019-04-03 NOTE — Telephone Encounter (Signed)
Last OV 01/10/19 Last refill 10/03/18 #30/5 Next OV 04/11/19  Forwarding to Dr. [name]

## 2019-04-11 ENCOUNTER — Encounter: Payer: Self-pay | Admitting: Family Medicine

## 2019-04-11 ENCOUNTER — Other Ambulatory Visit: Payer: Self-pay

## 2019-04-11 ENCOUNTER — Ambulatory Visit (INDEPENDENT_AMBULATORY_CARE_PROVIDER_SITE_OTHER): Payer: Medicare HMO | Admitting: Family Medicine

## 2019-04-11 VITALS — BP 120/80 | HR 84 | Temp 98.6°F | Ht 73.5 in | Wt 201.2 lb

## 2019-04-11 DIAGNOSIS — I10 Essential (primary) hypertension: Secondary | ICD-10-CM | POA: Diagnosis not present

## 2019-04-11 DIAGNOSIS — M10079 Idiopathic gout, unspecified ankle and foot: Secondary | ICD-10-CM

## 2019-04-11 DIAGNOSIS — E785 Hyperlipidemia, unspecified: Secondary | ICD-10-CM | POA: Diagnosis not present

## 2019-04-11 DIAGNOSIS — E663 Overweight: Secondary | ICD-10-CM

## 2019-04-11 DIAGNOSIS — F324 Major depressive disorder, single episode, in partial remission: Secondary | ICD-10-CM

## 2019-04-11 DIAGNOSIS — R5383 Other fatigue: Secondary | ICD-10-CM

## 2019-04-11 NOTE — Assessment & Plan Note (Signed)
S: Improved control with PHQ 9 under 5 back on citalopram 10 mg and Wellbutrin 150 mg Depression screen [facility] 2/9 04/11/2019  Decreased Interest 0  Down, Depressed, Hopeless 1  PHQ - 2 Score 1  Altered sleeping 0  Tired, decreased energy 1  Change in appetite 0  Feeling bad or failure about yourself  0  Trouble concentrating 0  Moving slowly or fidgety/restless 0  Suicidal thoughts 0  PHQ-9 Score 2  Difficult doing work/chores Not difficult at all  A/P: Much improved-continue current medication with citalopram from Dr. Brett Fairy and Wellbutrin from me.  I am certainly willing to refill citalopram if needed though

## 2019-04-11 NOTE — Patient Instructions (Addendum)
Health Maintenance Due  Topic Date Due  . INFLUENZA VACCINE -We should have flu shots available by September. Please strongly consider getting flu shot this year. If you get your flu shot at a pharmacy- please let us know.  04/01/2019   Glad things are going better for you  3-4 month follow up or sooner if you need [place]- make sure to stay well hydrated  No changes today and bloodwork was good enough in march so  we can hold off for now

## 2019-04-11 NOTE — Progress Notes (Signed)
Phone [phone]   Subjective:  Johnathan Sosa is a 73 y.o. year old very pleasant male patient who presents for/with See problem oriented charting Chief Complaint  Patient presents with  . Off Balance  . Fatigue   ROS-no fever/chills/sore throat/cough.  Past Medical History-  Patient Active Problem List   Diagnosis Date Noted  . Unintentional weight loss 12/21/2017    Priority: High  . Prostate cancer ([place]) 07/13/2017    Priority: High  . History of CVA (cerebrovascular accident) 07/20/2016    Priority: High  . Closed TBI (traumatic brain injury) ([place]) 10/24/2014    Priority: High  . 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 Memory loss     Priority: High  . History of adenomatous polyp of colon 01/25/2019    Priority: Medium  . Attention deficit hyperactivity disorder (ADHD) 07/06/2016    Priority: Medium  . Gout     Priority: Medium  . Insomnia     Priority: Medium  . Hyperlipidemia     Priority: Medium  . Depression, major, single episode, in partial remission ([place])     Priority: Medium  . HTN (hypertension) 04/29/2012    Priority: Medium  . Former smoker 10/01/2015    Priority: Low  . Erectile dysfunction 10/01/2015    Priority: Low  . Migraine headache     Priority: Low  . Abnormal eye movements 10/24/2014    Priority: Low  . 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 GERD (gastroesophageal reflux disease)     Priority: Low  . Recurrent falls while walking 02/06/2019  . MCI (mild cognitive impairment) with memory loss 02/06/2019  . Hypersomnia with sleep apnea 02/06/2019  . Excessive daytime sleepiness 06/07/2018  . MCI (mild cognitive impairment) 06/07/2018  . Fatigue due to depression 06/07/2018  . Aortic atherosclerosis ([place]) 12/21/2017  . Ileus ([place]) 09/04/2017  . 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 BPPV (benign paroxysmal positional vertigo) 12/21/2016  . Screening for  prostate cancer 10/16/2016  . Palpitations 07/20/2016    Medications- reviewed and updated Current Outpatient Medications  Medication Sig Dispense Refill  . allopurinol (ZYLOPRIM) 300 MG tablet TAKE 1 TABLET EVERY DAY 90 tablet 3  . ALPRAZolam (XANAX) 0.25 MG tablet TAKE BY MOUTH AS NEEDED FOR VERTIGO, DO NOT EXCEED 2 TABLETS PER DAY (Patient taking differently: TAKE TWICE DAILY PRN FOR VERTIGO, DO NOT EXCEED 2 TABLETS PER DAY) 60 tablet 0  . amLODipine (NORVASC) 2.5 MG tablet TAKE 1 TABLET EVERY DAY 90 tablet 3  . atorvastatin (LIPITOR) 10 MG tablet TAKE 1 TABLET EVERY DAY 90 tablet 0  . buPROPion (WELLBUTRIN SR) 150 MG 12 hr tablet Take 1 tablet (150 mg total) by mouth 2 (two) times daily. (Patient taking differently: Take 300 mg by mouth daily. ) 180 tablet 3  . clonazePAM (KLONOPIN) 1 MG tablet TAKE 1 TABLET BY MOUTH AT BEDTIME AS NEEDED 30 tablet 2  . citalopram (CELEXA) 10 MG tablet Take 10 mg by mouth daily. Per Dr. Brett Fairy per patient     No current facility-administered medications for this visit.      Objective:  BP 120/80 (BP Location: Left Arm, Patient Position: Sitting, Cuff Size: Normal)   Pulse 84   Temp 98.6 F (37 C) (Oral)   Ht 6' 1.5" (1.867 m)   Wt 201 lb 3.2 oz (91.3 kg)   SpO2 95%   BMI 26.19 kg/m  Gen: NAD, resting comfortably CV: RRR no murmurs rubs or gallops Lungs: CTAB no crackles, wheeze, rhonchi Abdomen: soft/nontender/nondistended/normal bowel sounds.  Skin: warm, dry No obvious gait or balance issues today     Assessment and Plan   #Gout S: Denies any recent flares on allopurinol Lab Results  Component Value Date   LABURIC 4.6 11/11/2018  A/P: Stable. Continue current medications.    #hypertension S: controlled on amlodipine 2.5 mg  BP Readings from Last 3 Encounters:  04/11/19 120/80  02/06/19 122/75  01/16/19 101/81  A/P:  Stable. Continue current medications.  States has a tendency not to drink enough fluids-can have some mild  orthostatic issues with that-given balance issues really needs to stay hydrated.  #hyperlipidemia S: Mild poorly controlled on atorvastatin 10 mg Lab Results  Component Value Date   CHOL 147 11/11/2018   HDL 33.30 (L) 11/11/2018   LDLCALC 71 10/08/2017   LDLDIRECT 93.0 11/11/2018   TRIG 235.0 (H) 11/11/2018   CHOLHDL 4 11/11/2018   A/P: Would love to see improve triglycerides and slight LDL improvement-advised healthy eating and regular exercise.  Glad he has lost 5 pounds.  Continue atorvastatin 10mg  for now- likely repeat lipids next march and if LDL remains above 70 may titrate dose given stroke history on prior MRI 07/07/2016.  -Could also consider simply increasing dose if not making progress by follow-up on weight  # Trouble with Balance S:Balance issues have been off and on. C/o occasional dizziness which has improved slightly over the past week. He has not had any falls over the past week.  He takes alprazolam from Dr. Brett Fairy to help with balance issues.  He knows he needs to space this by at least 8 hours from clonazepam which I have prescribed for sleep.  Reports Cane doesn't help hm- feels like falls backwards and so typically doesn't help him.  A/P: Continue follow-up with neurology   # Fatigued S:Pt c/o increased fatigue. If he walks to the mailbox and back he feels very fatigued.  Lost weight after surgery, down to 175 lbs, has been maintaining around 200 lbs. in the past we were concerned about unintentional weight loss but fortunately that has resolved. No sleep apnea per patient- reports negative sleep study in the past.   Fatigue better in last week A/P: Patient feels like he is improving and wants to continue to monitor only-may be that better control of depression is what has improved fatigue - Following up with urology-PSA trending up slightly with PSA 0.124 in May  #  overweight S:trying to stay close to 200- not exercising but is eating better . Tried to walk 2  miles 2 days back to back but due to balance issues he fell forward so has stoppe dthat efforts Wt Readings from Last 3 Encounters:  04/11/19 201 lb 3.2 oz (91.3 kg)  02/06/19 206 lb (93.4 kg)  01/16/19 210 lb (95.3 kg)  A/P: Congratulated patient on 5 pound weight loss- this has been intentional instead of unintentional as in the past.  This is encouraging.  Recommended having exercise as long as safer balance  # Depression S: Improved control with PHQ 9 under 5 back on citalopram 10 mg and Wellbutrin 150 mg Depression screen [facility] 2/9 04/11/2019  Decreased Interest 0  Down, Depressed, Hopeless 1  PHQ - 2 Score 1  Altered sleeping 0  Tired, decreased energy 1  Change in appetite 0  Feeling bad or failure about yourself  0  Trouble concentrating  0  Moving slowly or fidgety/restless 0  Suicidal thoughts 0  PHQ-9 Score 2  Difficult doing work/chores Not difficult at all  A/P: Much improved-continue current medication with citalopram from Dr. Brett Fairy and Wellbutrin from me.  I am certainly willing to refill citalopram if needed though  Recommended follow up: 3 to 6 months follow-up recommended Future Appointments  Date Time Provider [place]  07/20/2019  1:20 PM Marin Olp, MD [facility]  08/15/2019  2:00 PM [name], NP [facility] None   Lab/Order associations:   ICD-10-CM   1. Essential hypertension  I10   2. Hyperlipidemia, unspecified hyperlipidemia type  E78.5   3. Idiopathic gout of foot, unspecified chronicity, unspecified laterality  M10.079   4. Fatigue, unspecified type  R53.83   5. Overweight  E66.3   6. Depression, major, single episode, in partial remission ([place])  F32.4    Return precautions advised.  [name], MD

## 2019-04-21 DIAGNOSIS — C61 Malignant neoplasm of prostate: Secondary | ICD-10-CM | POA: Diagnosis not present

## 2019-04-27 ENCOUNTER — Other Ambulatory Visit: Payer: Self-pay | Admitting: Family Medicine

## 2019-04-28 NOTE — Telephone Encounter (Signed)
Last OV 04/11/2019 Last refilled by Dr. Beacher May for 150 mg. Pt reports taking 300 mg.   Forwarding to Dr. [name] to advise.

## 2019-05-02 ENCOUNTER — Other Ambulatory Visit: Payer: Self-pay | Admitting: Neurology

## 2019-05-02 DIAGNOSIS — R42 Dizziness and giddiness: Secondary | ICD-10-CM

## 2019-05-03 ENCOUNTER — Other Ambulatory Visit: Payer: Self-pay | Admitting: Family Medicine

## 2019-05-03 ENCOUNTER — Telehealth: Payer: Self-pay | Admitting: Neurology

## 2019-05-03 DIAGNOSIS — R42 Dizziness and giddiness: Secondary | ICD-10-CM

## 2019-05-03 MED ORDER — ALPRAZOLAM 0.25 MG PO TABS: ORAL_TABLET | ORAL | 0 refills | Status: DC

## 2019-05-03 MED ORDER — CITALOPRAM HYDROBROMIDE 10 MG PO TABS: 10.0000 mg | ORAL_TABLET | Freq: Every day | ORAL | 3 refills | Status: DC

## 2019-05-03 NOTE — Telephone Encounter (Signed)
I called pt and his wife Rod Holler answer the phone. I explain to notify pt that his dosage is correct and the xanax can be refill I also explained that the pharmacy has been call and the dosage has been the same since 2019. The wife verbalized and will let her husband know.

## 2019-05-03 NOTE — Telephone Encounter (Signed)
Patient contacted [employer] last night, left message of" Inner ear not working " and " being out of medication" without the medication being named.  I refilled xanax, which has been used up to bid to help with vertigo, probably with anxiety ,too. He is on Celexa.

## 2019-05-03 NOTE — Telephone Encounter (Signed)
Pt is needing to speak to the RN about his ALPRAZolam Duanne Moron) 0.25 MG tablet Pt states that they are not wanting to fill it for him because the dosage is incorrect. Please advise.

## 2019-05-03 NOTE — Telephone Encounter (Signed)
Spoke to patient who has run out of benzodiazepines which are used to control Vertigo.

## 2019-05-03 NOTE — Telephone Encounter (Signed)
I called [facility] and spoke with Lelan Pons. She states pt was never told the dosage was wrong by their pharmacy. She stated it was denied on yesterday but Dr.[name] sent another rx with pt taking 2 pills daily for vertigo as needed,not to exceed 2 tablets daily. She stated pt can refill the medication. I stated pt has been on the same dosage per previous refills and office notes.

## 2019-05-05 ENCOUNTER — Telehealth: Payer: Self-pay | Admitting: Medical Oncology

## 2019-05-05 NOTE — Telephone Encounter (Signed)
I spoke with Johnathan Sosa to confirm referral to [facility] 9/11 8:30 am. I met Johnathan Sosa in [facility] in 07/2017. He opted for robotic prostatectomy and now with rising PSA. I explained we are doing visits by WebEx and she is very familiar with WebEx. I will call her on Thursday and do a trial run appointment with her. I discussed the format of the clinic and the physicians they will consult with in the clinic. She voiced understanding and I encouraged her to call me with questions or concerns.

## 2019-05-09 ENCOUNTER — Encounter: Payer: Self-pay | Admitting: Medical Oncology

## 2019-05-11 ENCOUNTER — Telehealth: Payer: Self-pay | Admitting: Medical Oncology

## 2019-05-11 NOTE — Telephone Encounter (Signed)
Spoke with Liz-wife to confirm appointment for [facility] 9/11. We did a trial WebEx without any difficulty. I will call her in the morning before we start WebEx to let her know we are ready to begin. She voiced understanding.

## 2019-05-12 ENCOUNTER — Ambulatory Visit
Admission: RE | Admit: 2019-05-12 | Discharge: 2019-05-12 | Disposition: A | Discharge status: Home / Self Care | Payer: Medicare HMO | Source: Ambulatory Visit | Attending: Radiation Oncology | Admitting: Radiation Oncology

## 2019-05-12 ENCOUNTER — Encounter: Payer: Self-pay | Admitting: Medical Oncology

## 2019-05-12 ENCOUNTER — Inpatient Hospital Stay: Payer: Medicare HMO | Attending: Oncology | Admitting: Oncology

## 2019-05-12 DIAGNOSIS — C61 Malignant neoplasm of prostate: Secondary | ICD-10-CM | POA: Insufficient documentation

## 2019-05-12 DIAGNOSIS — Z8042 Family history of malignant neoplasm of prostate: Secondary | ICD-10-CM | POA: Diagnosis not present

## 2019-05-12 DIAGNOSIS — R9721 Rising PSA following treatment for malignant neoplasm of prostate: Secondary | ICD-10-CM | POA: Diagnosis not present

## 2019-05-12 DIAGNOSIS — Z9079 Acquired absence of other genital organ(s): Secondary | ICD-10-CM | POA: Diagnosis not present
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
## 2019-05-12 NOTE — Progress Notes (Signed)
Hematology and Oncology Follow Up for Telemedicine Visits  Dameian Lozo [id number] 05-26-1946 73 y.o. 05/12/2019 9:39 AM [name], Brayton Mars, MDHunter, Brayton Mars, MD   I connected with Mr. Steinbacher on 05/12/19 at  8:30 AM EDT by video enabled telemedicine visit and verified that I am speaking with the correct person using two identifiers.   I discussed the limitations, risks, security and privacy concerns of performing an evaluation and management service by telemedicine and the availability of in-person appointments. I also discussed with the patient that there may be a patient responsible charge related to this service. The patient expressed understanding and agreed to proceed.  Other persons participating in the visit and their role in the encounter:  None  Patient's location:  Home Provider's location:  *Office    Principle Diagnosis: 73 year old man with prostate cancer diagnosed in December 2018.  He was found to have a PSA of 3.48 and prostate biopsy showed a Gleason score 4+4 = 8.  He developed biochemical relapse and 2020 with a PSA of 0.19.   Prior Therapy:  He is status post robotic assisted laparoscopic radical prostatectomy with bilateral lymphadenectomy completed on September 02, 2017.  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  0 out of 6 lymph nodes showed evidence of malignancy with a final pathological staging was T2CN0.  His PSA was undetectable up till May 2020 which was noted to be 0.12 and in August 2020 was 0.19.  Current therapy: Under evaluation for different salvage therapy.  Interim History: Mr. Gantz with evaluated today in the prostate cancer multidisciplinary clinic after initial evaluation in 2018.  He underwent radical prostatectomy as mentioned with PSA undetectable therapeutic time till May 2020.  His most recent PSA was 0.19 in August 2020.  He is asymptomatic at this time  and has recovered well from his operation.   Medications: I have reviewed the patient's current medications.  Current Outpatient Medications  Medication Sig Dispense Refill  . allopurinol (ZYLOPRIM) 300 MG tablet TAKE 1 TABLET EVERY DAY 90 tablet 3  . ALPRAZolam (XANAX) 0.25 MG tablet TAKE TWICE DAILY PRN FOR VERTIGO, DO NOT EXCEED 2 TABLETS PER DAY 60 tablet 0  . amLODipine (NORVASC) 2.5 MG tablet TAKE 1 TABLET EVERY DAY 90 tablet 3  . atorvastatin (LIPITOR) 10 MG tablet TAKE 1 TABLET EVERY DAY 90 tablet 1  . buPROPion (WELLBUTRIN XL) 300 MG 24 hr tablet TAKE 1 TABLET EVERY DAY 90 tablet 1  . citalopram (CELEXA) 10 MG tablet Take 1 tablet (10 mg total) by mouth daily. Per Dr. Brett Fairy per patient 90 tablet 3  . clonazePAM (KLONOPIN) 1 MG tablet TAKE 1 TABLET BY MOUTH AT BEDTIME AS NEEDED 30 tablet 2   No current facility-administered medications for this visit.      Allergies:  Allergies  Allergen Reactions  . Black Pepper [Piper]     redness of skin, profuse sweating  . Ritalin [Methylphenidate Hcl] Other (See Comments)    Joint's ache.  . Sulfa Antibiotics Itching and Rash    Past Medical History, Surgical history, Social history, and Family History were without any changes on review.     Lab Results: Lab Results  Component Value Date   WBC 9.1 11/11/2018   HGB 15.4 11/11/2018   HCT 43.8 11/11/2018   MCV 89.1 11/11/2018   PLT 213.0 11/11/2018     Chemistry      Component Value Date/Time  NA 139 11/11/2018 1353   NA 138 10/18/2014   K 4.5 11/11/2018 1353   CL 105 11/11/2018 1353   CO2 26 11/11/2018 1353   BUN 21 11/11/2018 1353   BUN 14 10/18/2014   CREATININE 1.19 11/11/2018 1353   CREATININE 1.27 (H) 10/16/2016 1546   GLU 93 10/18/2014      Component Value Date/Time   CALCIUM 9.8 11/11/2018 1353   ALKPHOS 55 11/11/2018 1353   AST 19 11/11/2018 1353   ALT 30 11/11/2018 1353   BILITOT 0.5 11/11/2018 1353       Impression and Plan:   73 year old  with:  1.  Prostate cancer diagnosed in November 2018.  His Gleason score was 4+4 = 8 and a PSA of 3.48.  He status post radical prostatectomy completed in January 2019 which confirmed the presence of a Gleason score 4+4 = 8 and pathological staging of T2c.  His PSA remains undetectable and currently in August 2020 was 0.19.  His case was discussed today in the prostate cancer multidisciplinary clinic including review his pathology reports with the reviewing pathologist.  Options of treatment were also reviewed today with the patient.  These options would include continued active surveillance versus definitive therapy with radiation.  The role of androgen deprivation was also discussed.  Complication associated with 6 months of ADT was reiterated.  These include hot flashes, weight gain among others.  The natural course of this disease was also reviewed including risk of relapse and developing metastatic disease and treatment options in the future.  The role of 5 different systemic therapy options was discussed including androgen synthesis inhibitors, radiation therapy, systemic chemotherapy and immunotherapy potentially.  We also discussed these options are not curative but they can offer disease control but an extended period of time.  2.  Follow-up: He understands that if he develops advanced disease in the future with at that time.  I discussed the assessment and treatment plan with the patient. The patient was provided an opportunity to ask questions and all were answered. The patient agreed with the plan and demonstrated an understanding of the instructions.   The patient was advised to call back or seek an in-person evaluation if the symptoms worsen or if the condition fails to improve as anticipated.  I provided 25 minutes of face-to-face video visit time during this encounter, and > 50% was dedicated to reviewing his disease status, reviewing pathology reports, treatment options and  complications related to therapy.  Zola Button, MD 05/12/2019 9:39 AM
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
## 2019-05-12 NOTE — Progress Notes (Signed)
                               Care Plan Summary  Name: Johnathan Sosa DOB: June 24, 1946   Your Medical Team:   Urologist -  Dr. Raynelle Bring, [facility]  Radiation Oncologist - Dr. Tyler Pita, [facility]   Medical Oncologist - Dr. Zola Button, [place]  Recommendations: 1) Hormone injection 2)  Radiation    * These recommendations are based on information available as of today's consult.      Recommendations may change depending on the results of further tests or exams.   Next Steps: 1) Dr. Lynne Logan office will call you with appointment for hormone injection     When appointments need to be scheduled, you will be contacted by [facility] and/or [facility].  Questions?  Please do not hesitate to call Cira Rue, RN, BSN, OCN at [phone] any questions or concerns.  Shirlean Mylar is your Oncology Nurse Navigator and is available to assist you while you're receiving your medical care at [facility].

## 2019-05-16 ENCOUNTER — Telehealth: Payer: Self-pay | Admitting: *Deleted

## 2019-05-16 NOTE — Telephone Encounter (Signed)
Called patient to inform of sim appt. On 05-19-19 - arrival time- 12:45 pm @ [place], spoke with patient's wife - [name] and she is aware of this appt.

## 2019-05-17 DIAGNOSIS — Z5111 Encounter for antineoplastic chemotherapy: Secondary | ICD-10-CM | POA: Diagnosis not present

## 2019-05-17 DIAGNOSIS — C61 Malignant neoplasm of prostate: Secondary | ICD-10-CM | POA: Diagnosis not present

## 2019-05-19 ENCOUNTER — Encounter: Payer: Self-pay | Admitting: Medical Oncology

## 2019-05-19 ENCOUNTER — Other Ambulatory Visit: Payer: Self-pay

## 2019-05-19 ENCOUNTER — Ambulatory Visit
Admission: RE | Admit: 2019-05-19 | Discharge: 2019-05-19 | Disposition: A | Discharge status: Home / Self Care | Payer: Medicare HMO | Source: Ambulatory Visit | Attending: Radiation Oncology | Admitting: Radiation Oncology

## 2019-05-19 DIAGNOSIS — C61 Malignant neoplasm of prostate: Secondary | ICD-10-CM

## 2019-05-19 DIAGNOSIS — Z510 Encounter for antineoplastic radiation therapy: Secondary | ICD-10-CM | POA: Insufficient documentation

## 2019-05-21 NOTE — Progress Notes (Signed)
  Radiation Oncology         [phone] ________________________________  Name: Johnathan Sosa MRN: [id number]  Date: 05/19/2019  DOB: 1946/08/13  SIMULATION AND TREATMENT PLANNING NOTE    ICD-10-CM   1. Prostate cancer [facility])  C61     DIAGNOSIS:  73 y.o. gentleman with biochemical recurrence of locally advanced prostate cancer with PSA of 0.19 s/p RALP for Stage pT2c, pN0, Gleason 4+4 adenocarcinoma of the prostate  NARRATIVE:  The patient was brought to the [facility].  Identity was confirmed.  All relevant records and images related to the planned course of therapy were reviewed.  The patient freely provided informed written consent to proceed with treatment after reviewing the details related to the planned course of therapy. The consent form was witnessed and verified by the simulation staff.  Then, the patient was set-up in a stable reproducible supine position for radiation therapy.  A vacuum lock pillow device was custom fabricated to position his legs in a reproducible immobilized position.  Then, I performed a urethrogram under sterile conditions to identify the prostatic bed.  CT images were obtained.  Surface markings were placed.  The CT images were loaded into the planning software.  Then the prostate bed target, pelvic lymph node target and avoidance structures including the rectum, bladder, bowel and hips were contoured.  Treatment planning then occurred.  The radiation prescription was entered and confirmed.  A total of one complex treatment devices were fabricated. I have requested : Intensity Modulated Radiotherapy (IMRT) is medically necessary for this case for the following reason:  Rectal sparing.[name] Kitchen  PLAN:  The patient will receive 45 Gy in 25 fractions of 1.8 Gy, followed by a boost to the prostate bed to a total dose of 68.4 Gy with 13 additional fractions of 1.8 Gy.   ________________________________  Sheral Apley Tammi Klippel, M.D.

## 2019-05-26 NOTE — Progress Notes (Signed)
Counseling Intern Notes:  I spoke to Johnathan Sosa's spouse, [name], by phone on 05/26/2019. She is currently living apart from the patient, but continues to provide support. [name] stated the patient's dementia and problems with short term memory would not make him a good candidate for counseling. [name] reported that the patient is extremely "negative and worried all the time".  She stated that Zenia Resides "sleeps all the time" and is "frail". She stated the patient recently fell after feeling dizzy. [name] reported that the patient is worried about the recent elevation in his PSA. I offered her support services as the caregiver, but she declined at this time stating she has alternate forms of emotional support.    Art Buff Chandler Counseling Intern Voicemail:  [phone]

## 2019-05-29 ENCOUNTER — Ambulatory Visit: Payer: Medicare HMO | Admitting: Radiation Oncology

## 2019-05-29 DIAGNOSIS — C61 Malignant neoplasm of prostate: Secondary | ICD-10-CM | POA: Diagnosis not present

## 2019-05-29 DIAGNOSIS — Z510 Encounter for antineoplastic radiation therapy: Secondary | ICD-10-CM | POA: Diagnosis not present

## 2019-05-30 ENCOUNTER — Telehealth: Payer: Self-pay | Admitting: Medical Oncology

## 2019-05-30 NOTE — Telephone Encounter (Signed)
Patient called stating he received an email with a bill of $45.00. He is not sure what this pertains to, because he paid a co- pay when he came in. He would also like to receive it in paper form and at a different address than the one listed. I gave him the number to [facility] billing and asked him to call to  discuss. He voiced understanding. and I asked him to call be back if he needs further assistance. He begins radiation tomorrow and confirmed his appointment.

## 2019-05-31 ENCOUNTER — Ambulatory Visit
Admission: RE | Admit: 2019-05-31 | Discharge: 2019-05-31 | Disposition: A | Discharge status: Home / Self Care | Payer: Medicare HMO | Source: Ambulatory Visit | Attending: Radiation Oncology | Admitting: Radiation Oncology

## 2019-05-31 ENCOUNTER — Other Ambulatory Visit: Payer: Self-pay

## 2019-05-31 DIAGNOSIS — Z510 Encounter for antineoplastic radiation therapy: Secondary | ICD-10-CM | POA: Diagnosis not present

## 2019-05-31 DIAGNOSIS — C61 Malignant neoplasm of prostate: Secondary | ICD-10-CM | POA: Diagnosis not present

## 2019-06-01 ENCOUNTER — Ambulatory Visit
Admission: RE | Admit: 2019-06-01 | Discharge: 2019-06-01 | Disposition: A | Discharge status: Home / Self Care | Payer: Medicare HMO | Source: Ambulatory Visit | Attending: Radiation Oncology | Admitting: Radiation Oncology

## 2019-06-01 ENCOUNTER — Other Ambulatory Visit: Payer: Self-pay

## 2019-06-01 DIAGNOSIS — C61 Malignant neoplasm of prostate: Secondary | ICD-10-CM | POA: Insufficient documentation

## 2019-06-01 DIAGNOSIS — Z510 Encounter for antineoplastic radiation therapy: Secondary | ICD-10-CM | POA: Diagnosis not present

## 2019-06-02 ENCOUNTER — Other Ambulatory Visit: Payer: Self-pay

## 2019-06-02 ENCOUNTER — Ambulatory Visit
Admission: RE | Admit: 2019-06-02 | Discharge: 2019-06-02 | Disposition: A | Discharge status: Home / Self Care | Payer: Medicare HMO | Source: Ambulatory Visit | Attending: Radiation Oncology | Admitting: Radiation Oncology

## 2019-06-02 DIAGNOSIS — Z510 Encounter for antineoplastic radiation therapy: Secondary | ICD-10-CM | POA: Diagnosis not present

## 2019-06-02 DIAGNOSIS — C61 Malignant neoplasm of prostate: Secondary | ICD-10-CM | POA: Diagnosis not present

## 2019-06-05 ENCOUNTER — Ambulatory Visit
Admission: RE | Admit: 2019-06-05 | Discharge: 2019-06-05 | Disposition: A | Discharge status: Home / Self Care | Payer: Medicare HMO | Source: Ambulatory Visit | Attending: Radiation Oncology | Admitting: Radiation Oncology

## 2019-06-05 ENCOUNTER — Other Ambulatory Visit: Payer: Self-pay

## 2019-06-05 DIAGNOSIS — C61 Malignant neoplasm of prostate: Secondary | ICD-10-CM | POA: Diagnosis not present

## 2019-06-05 DIAGNOSIS — Z510 Encounter for antineoplastic radiation therapy: Secondary | ICD-10-CM | POA: Diagnosis not present

## 2019-06-06 ENCOUNTER — Ambulatory Visit
Admission: RE | Admit: 2019-06-06 | Discharge: 2019-06-06 | Disposition: A | Discharge status: Home / Self Care | Payer: Medicare HMO | Source: Ambulatory Visit | Attending: Radiation Oncology | Admitting: Radiation Oncology

## 2019-06-06 ENCOUNTER — Other Ambulatory Visit: Payer: Self-pay

## 2019-06-06 DIAGNOSIS — Z510 Encounter for antineoplastic radiation therapy: Secondary | ICD-10-CM | POA: Diagnosis not present

## 2019-06-06 DIAGNOSIS — C61 Malignant neoplasm of prostate: Secondary | ICD-10-CM | POA: Diagnosis not present

## 2019-06-07 ENCOUNTER — Ambulatory Visit
Admission: RE | Admit: 2019-06-07 | Discharge: 2019-06-07 | Disposition: A | Discharge status: Home / Self Care | Payer: Medicare HMO | Source: Ambulatory Visit | Attending: Radiation Oncology | Admitting: Radiation Oncology

## 2019-06-07 ENCOUNTER — Other Ambulatory Visit: Payer: Self-pay

## 2019-06-07 DIAGNOSIS — Z510 Encounter for antineoplastic radiation therapy: Secondary | ICD-10-CM | POA: Diagnosis not present

## 2019-06-07 DIAGNOSIS — C61 Malignant neoplasm of prostate: Secondary | ICD-10-CM | POA: Diagnosis not present

## 2019-06-08 ENCOUNTER — Other Ambulatory Visit: Payer: Self-pay

## 2019-06-08 ENCOUNTER — Ambulatory Visit
Admission: RE | Admit: 2019-06-08 | Discharge: 2019-06-08 | Disposition: A | Discharge status: Home / Self Care | Payer: Medicare HMO | Source: Ambulatory Visit | Attending: Radiation Oncology | Admitting: Radiation Oncology

## 2019-06-08 DIAGNOSIS — C61 Malignant neoplasm of prostate: Secondary | ICD-10-CM | POA: Diagnosis not present

## 2019-06-08 DIAGNOSIS — Z510 Encounter for antineoplastic radiation therapy: Secondary | ICD-10-CM | POA: Diagnosis not present

## 2019-06-09 ENCOUNTER — Ambulatory Visit
Admission: RE | Admit: 2019-06-09 | Discharge: 2019-06-09 | Disposition: A | Discharge status: Home / Self Care | Payer: Medicare HMO | Source: Ambulatory Visit | Attending: Radiation Oncology | Admitting: Radiation Oncology

## 2019-06-09 ENCOUNTER — Other Ambulatory Visit: Payer: Self-pay

## 2019-06-09 DIAGNOSIS — Z510 Encounter for antineoplastic radiation therapy: Secondary | ICD-10-CM | POA: Diagnosis not present

## 2019-06-09 DIAGNOSIS — C61 Malignant neoplasm of prostate: Secondary | ICD-10-CM | POA: Diagnosis not present

## 2019-06-12 ENCOUNTER — Ambulatory Visit
Admission: RE | Admit: 2019-06-12 | Discharge: 2019-06-12 | Disposition: A | Discharge status: Home / Self Care | Payer: Medicare HMO | Source: Ambulatory Visit | Attending: Radiation Oncology | Admitting: Radiation Oncology

## 2019-06-12 ENCOUNTER — Other Ambulatory Visit: Payer: Self-pay

## 2019-06-12 DIAGNOSIS — C61 Malignant neoplasm of prostate: Secondary | ICD-10-CM | POA: Diagnosis not present

## 2019-06-12 DIAGNOSIS — Z510 Encounter for antineoplastic radiation therapy: Secondary | ICD-10-CM | POA: Diagnosis not present

## 2019-06-13 ENCOUNTER — Other Ambulatory Visit: Payer: Self-pay

## 2019-06-13 ENCOUNTER — Ambulatory Visit
Admission: RE | Admit: 2019-06-13 | Discharge: 2019-06-13 | Disposition: A | Discharge status: Home / Self Care | Payer: Medicare HMO | Source: Ambulatory Visit | Attending: Radiation Oncology | Admitting: Radiation Oncology

## 2019-06-13 DIAGNOSIS — C61 Malignant neoplasm of prostate: Secondary | ICD-10-CM | POA: Diagnosis not present

## 2019-06-13 DIAGNOSIS — Z510 Encounter for antineoplastic radiation therapy: Secondary | ICD-10-CM | POA: Diagnosis not present

## 2019-06-14 ENCOUNTER — Other Ambulatory Visit: Payer: Self-pay

## 2019-06-14 ENCOUNTER — Ambulatory Visit
Admission: RE | Admit: 2019-06-14 | Discharge: 2019-06-14 | Disposition: A | Discharge status: Home / Self Care | Payer: Medicare HMO | Source: Ambulatory Visit | Attending: Radiation Oncology | Admitting: Radiation Oncology

## 2019-06-14 DIAGNOSIS — C61 Malignant neoplasm of prostate: Secondary | ICD-10-CM | POA: Diagnosis not present

## 2019-06-14 DIAGNOSIS — Z510 Encounter for antineoplastic radiation therapy: Secondary | ICD-10-CM | POA: Diagnosis not present

## 2019-06-15 ENCOUNTER — Other Ambulatory Visit: Payer: Self-pay

## 2019-06-15 ENCOUNTER — Other Ambulatory Visit: Payer: Self-pay | Admitting: Neurology

## 2019-06-15 ENCOUNTER — Ambulatory Visit
Admission: RE | Admit: 2019-06-15 | Discharge: 2019-06-15 | Disposition: A | Discharge status: Home / Self Care | Payer: Medicare HMO | Source: Ambulatory Visit | Attending: Radiation Oncology | Admitting: Radiation Oncology

## 2019-06-15 DIAGNOSIS — C61 Malignant neoplasm of prostate: Secondary | ICD-10-CM | POA: Diagnosis not present

## 2019-06-15 DIAGNOSIS — Z510 Encounter for antineoplastic radiation therapy: Secondary | ICD-10-CM | POA: Diagnosis not present

## 2019-06-15 DIAGNOSIS — R42 Dizziness and giddiness: Secondary | ICD-10-CM

## 2019-06-16 ENCOUNTER — Other Ambulatory Visit: Payer: Self-pay

## 2019-06-16 ENCOUNTER — Encounter: Payer: Self-pay | Admitting: Medical Oncology

## 2019-06-16 ENCOUNTER — Ambulatory Visit
Admission: RE | Admit: 2019-06-16 | Discharge: 2019-06-16 | Disposition: A | Discharge status: Home / Self Care | Payer: Medicare HMO | Source: Ambulatory Visit | Attending: Radiation Oncology | Admitting: Radiation Oncology

## 2019-06-16 DIAGNOSIS — Z510 Encounter for antineoplastic radiation therapy: Secondary | ICD-10-CM | POA: Diagnosis not present

## 2019-06-16 DIAGNOSIS — C61 Malignant neoplasm of prostate: Secondary | ICD-10-CM | POA: Diagnosis not present

## 2019-06-19 ENCOUNTER — Ambulatory Visit
Admission: RE | Admit: 2019-06-19 | Discharge: 2019-06-19 | Disposition: A | Discharge status: Home / Self Care | Payer: Medicare HMO | Source: Ambulatory Visit | Attending: Radiation Oncology | Admitting: Radiation Oncology

## 2019-06-19 ENCOUNTER — Other Ambulatory Visit: Payer: Self-pay

## 2019-06-19 DIAGNOSIS — C61 Malignant neoplasm of prostate: Secondary | ICD-10-CM | POA: Diagnosis not present

## 2019-06-19 DIAGNOSIS — Z510 Encounter for antineoplastic radiation therapy: Secondary | ICD-10-CM | POA: Diagnosis not present

## 2019-06-20 ENCOUNTER — Ambulatory Visit
Admission: RE | Admit: 2019-06-20 | Discharge: 2019-06-20 | Disposition: A | Discharge status: Home / Self Care | Payer: Medicare HMO | Source: Ambulatory Visit | Attending: Radiation Oncology | Admitting: Radiation Oncology

## 2019-06-20 ENCOUNTER — Other Ambulatory Visit: Payer: Self-pay

## 2019-06-20 DIAGNOSIS — Z510 Encounter for antineoplastic radiation therapy: Secondary | ICD-10-CM | POA: Diagnosis not present

## 2019-06-20 DIAGNOSIS — C61 Malignant neoplasm of prostate: Secondary | ICD-10-CM | POA: Diagnosis not present

## 2019-06-21 ENCOUNTER — Ambulatory Visit
Admission: RE | Admit: 2019-06-21 | Discharge: 2019-06-21 | Disposition: A | Discharge status: Home / Self Care | Payer: Medicare HMO | Source: Ambulatory Visit | Attending: Radiation Oncology | Admitting: Radiation Oncology

## 2019-06-21 ENCOUNTER — Other Ambulatory Visit: Payer: Self-pay

## 2019-06-21 DIAGNOSIS — C61 Malignant neoplasm of prostate: Secondary | ICD-10-CM | POA: Diagnosis not present

## 2019-06-21 DIAGNOSIS — Z510 Encounter for antineoplastic radiation therapy: Secondary | ICD-10-CM | POA: Diagnosis not present

## 2019-06-22 ENCOUNTER — Ambulatory Visit
Admission: RE | Admit: 2019-06-22 | Discharge: 2019-06-22 | Disposition: A | Discharge status: Home / Self Care | Payer: Medicare HMO | Source: Ambulatory Visit | Attending: Radiation Oncology | Admitting: Radiation Oncology

## 2019-06-22 ENCOUNTER — Other Ambulatory Visit: Payer: Self-pay

## 2019-06-22 DIAGNOSIS — Z510 Encounter for antineoplastic radiation therapy: Secondary | ICD-10-CM | POA: Diagnosis not present

## 2019-06-22 DIAGNOSIS — C61 Malignant neoplasm of prostate: Secondary | ICD-10-CM | POA: Diagnosis not present

## 2019-06-23 ENCOUNTER — Encounter: Payer: Self-pay | Admitting: Medical Oncology

## 2019-06-23 ENCOUNTER — Other Ambulatory Visit: Payer: Self-pay

## 2019-06-23 ENCOUNTER — Ambulatory Visit
Admission: RE | Admit: 2019-06-23 | Discharge: 2019-06-23 | Disposition: A | Discharge status: Home / Self Care | Payer: Medicare HMO | Source: Ambulatory Visit | Attending: Radiation Oncology | Admitting: Radiation Oncology

## 2019-06-23 DIAGNOSIS — Z510 Encounter for antineoplastic radiation therapy: Secondary | ICD-10-CM | POA: Diagnosis not present

## 2019-06-23 DIAGNOSIS — C61 Malignant neoplasm of prostate: Secondary | ICD-10-CM | POA: Diagnosis not present

## 2019-06-23 NOTE — Progress Notes (Signed)
Mr. Pont doing well with radiation except for bladder filling per therapist. We discussed the importance of having a comfortably full bladder to reduce side effects of radiation. He states he tries to drink but feels like he has to go urinate. We discussed various ways and times to drink fluids before radiation. He voiced understanding and will try to drink more.

## 2019-06-26 ENCOUNTER — Other Ambulatory Visit: Payer: Self-pay

## 2019-06-26 ENCOUNTER — Ambulatory Visit
Admission: RE | Admit: 2019-06-26 | Discharge: 2019-06-26 | Disposition: A | Discharge status: Home / Self Care | Payer: Medicare HMO | Source: Ambulatory Visit | Attending: Radiation Oncology | Admitting: Radiation Oncology

## 2019-06-26 DIAGNOSIS — C61 Malignant neoplasm of prostate: Secondary | ICD-10-CM | POA: Diagnosis not present

## 2019-06-26 DIAGNOSIS — Z510 Encounter for antineoplastic radiation therapy: Secondary | ICD-10-CM | POA: Diagnosis not present

## 2019-06-27 ENCOUNTER — Ambulatory Visit
Admission: RE | Admit: 2019-06-27 | Discharge: 2019-06-27 | Disposition: A | Discharge status: Home / Self Care | Payer: Medicare HMO | Source: Ambulatory Visit | Attending: Radiation Oncology | Admitting: Radiation Oncology

## 2019-06-27 ENCOUNTER — Other Ambulatory Visit: Payer: Self-pay

## 2019-06-27 DIAGNOSIS — C61 Malignant neoplasm of prostate: Secondary | ICD-10-CM | POA: Diagnosis not present

## 2019-06-27 DIAGNOSIS — Z510 Encounter for antineoplastic radiation therapy: Secondary | ICD-10-CM | POA: Diagnosis not present

## 2019-06-28 ENCOUNTER — Other Ambulatory Visit: Payer: Self-pay

## 2019-06-28 ENCOUNTER — Ambulatory Visit
Admission: RE | Admit: 2019-06-28 | Discharge: 2019-06-28 | Disposition: A | Discharge status: Home / Self Care | Payer: Medicare HMO | Source: Ambulatory Visit | Attending: Radiation Oncology | Admitting: Radiation Oncology

## 2019-06-28 DIAGNOSIS — Z510 Encounter for antineoplastic radiation therapy: Secondary | ICD-10-CM | POA: Diagnosis not present

## 2019-06-28 DIAGNOSIS — C61 Malignant neoplasm of prostate: Secondary | ICD-10-CM | POA: Diagnosis not present

## 2019-06-29 ENCOUNTER — Ambulatory Visit: Payer: Medicare HMO

## 2019-06-30 ENCOUNTER — Ambulatory Visit
Admission: RE | Admit: 2019-06-30 | Discharge: 2019-06-30 | Disposition: A | Discharge status: Home / Self Care | Payer: Medicare HMO | Source: Ambulatory Visit | Attending: Radiation Oncology | Admitting: Radiation Oncology

## 2019-06-30 ENCOUNTER — Other Ambulatory Visit: Payer: Self-pay

## 2019-06-30 DIAGNOSIS — Z510 Encounter for antineoplastic radiation therapy: Secondary | ICD-10-CM | POA: Diagnosis not present

## 2019-06-30 DIAGNOSIS — C61 Malignant neoplasm of prostate: Secondary | ICD-10-CM | POA: Diagnosis not present

## 2019-07-03 ENCOUNTER — Other Ambulatory Visit: Payer: Self-pay

## 2019-07-03 ENCOUNTER — Ambulatory Visit
Admission: RE | Admit: 2019-07-03 | Discharge: 2019-07-03 | Disposition: A | Discharge status: Home / Self Care | Payer: Medicare HMO | Source: Ambulatory Visit | Attending: Radiation Oncology | Admitting: Radiation Oncology

## 2019-07-03 DIAGNOSIS — Z510 Encounter for antineoplastic radiation therapy: Secondary | ICD-10-CM | POA: Diagnosis not present

## 2019-07-03 DIAGNOSIS — C61 Malignant neoplasm of prostate: Secondary | ICD-10-CM | POA: Diagnosis not present

## 2019-07-04 ENCOUNTER — Ambulatory Visit
Admission: RE | Admit: 2019-07-04 | Discharge: 2019-07-04 | Disposition: A | Discharge status: Home / Self Care | Payer: Medicare HMO | Source: Ambulatory Visit | Attending: Radiation Oncology | Admitting: Radiation Oncology

## 2019-07-04 ENCOUNTER — Other Ambulatory Visit: Payer: Self-pay

## 2019-07-04 DIAGNOSIS — Z510 Encounter for antineoplastic radiation therapy: Secondary | ICD-10-CM | POA: Diagnosis not present

## 2019-07-04 DIAGNOSIS — C61 Malignant neoplasm of prostate: Secondary | ICD-10-CM | POA: Diagnosis not present

## 2019-07-05 ENCOUNTER — Ambulatory Visit
Admission: RE | Admit: 2019-07-05 | Discharge: 2019-07-05 | Disposition: A | Discharge status: Home / Self Care | Payer: Medicare HMO | Source: Ambulatory Visit | Attending: Radiation Oncology | Admitting: Radiation Oncology

## 2019-07-05 ENCOUNTER — Other Ambulatory Visit: Payer: Self-pay

## 2019-07-05 DIAGNOSIS — Z961 Presence of intraocular lens: Secondary | ICD-10-CM | POA: Diagnosis not present

## 2019-07-05 DIAGNOSIS — C61 Malignant neoplasm of prostate: Secondary | ICD-10-CM | POA: Diagnosis not present

## 2019-07-05 DIAGNOSIS — H43392 Other vitreous opacities, left eye: Secondary | ICD-10-CM | POA: Diagnosis not present

## 2019-07-05 DIAGNOSIS — H31092 Other chorioretinal scars, left eye: Secondary | ICD-10-CM | POA: Diagnosis not present

## 2019-07-05 DIAGNOSIS — H26493 Other secondary cataract, bilateral: Secondary | ICD-10-CM | POA: Diagnosis not present

## 2019-07-05 DIAGNOSIS — Z510 Encounter for antineoplastic radiation therapy: Secondary | ICD-10-CM | POA: Diagnosis not present

## 2019-07-05 DIAGNOSIS — H43811 Vitreous degeneration, right eye: Secondary | ICD-10-CM | POA: Diagnosis not present

## 2019-07-06 ENCOUNTER — Other Ambulatory Visit: Payer: Self-pay

## 2019-07-06 ENCOUNTER — Ambulatory Visit
Admission: RE | Admit: 2019-07-06 | Discharge: 2019-07-06 | Disposition: A | Discharge status: Home / Self Care | Payer: Medicare HMO | Source: Ambulatory Visit | Attending: Radiation Oncology | Admitting: Radiation Oncology

## 2019-07-06 DIAGNOSIS — Z510 Encounter for antineoplastic radiation therapy: Secondary | ICD-10-CM | POA: Diagnosis not present

## 2019-07-06 DIAGNOSIS — C61 Malignant neoplasm of prostate: Secondary | ICD-10-CM | POA: Diagnosis not present

## 2019-07-07 ENCOUNTER — Other Ambulatory Visit: Payer: Self-pay

## 2019-07-07 ENCOUNTER — Ambulatory Visit
Admission: RE | Admit: 2019-07-07 | Discharge: 2019-07-07 | Disposition: A | Discharge status: Home / Self Care | Payer: Medicare HMO | Source: Ambulatory Visit | Attending: Radiation Oncology | Admitting: Radiation Oncology

## 2019-07-07 DIAGNOSIS — C61 Malignant neoplasm of prostate: Secondary | ICD-10-CM | POA: Diagnosis not present

## 2019-07-07 DIAGNOSIS — Z510 Encounter for antineoplastic radiation therapy: Secondary | ICD-10-CM | POA: Diagnosis not present

## 2019-07-10 ENCOUNTER — Ambulatory Visit
Admission: RE | Admit: 2019-07-10 | Discharge: 2019-07-10 | Disposition: A | Discharge status: Home / Self Care | Payer: Medicare HMO | Source: Ambulatory Visit | Attending: Radiation Oncology | Admitting: Radiation Oncology

## 2019-07-10 ENCOUNTER — Other Ambulatory Visit: Payer: Self-pay

## 2019-07-10 DIAGNOSIS — Z510 Encounter for antineoplastic radiation therapy: Secondary | ICD-10-CM | POA: Diagnosis not present

## 2019-07-10 DIAGNOSIS — C61 Malignant neoplasm of prostate: Secondary | ICD-10-CM | POA: Diagnosis not present

## 2019-07-11 ENCOUNTER — Other Ambulatory Visit: Payer: Self-pay

## 2019-07-11 ENCOUNTER — Ambulatory Visit
Admission: RE | Admit: 2019-07-11 | Discharge: 2019-07-11 | Disposition: A | Discharge status: Home / Self Care | Payer: Medicare HMO | Source: Ambulatory Visit | Attending: Radiation Oncology | Admitting: Radiation Oncology

## 2019-07-11 ENCOUNTER — Other Ambulatory Visit: Payer: Self-pay | Admitting: Family Medicine

## 2019-07-11 DIAGNOSIS — C61 Malignant neoplasm of prostate: Secondary | ICD-10-CM | POA: Diagnosis not present

## 2019-07-11 DIAGNOSIS — Z510 Encounter for antineoplastic radiation therapy: Secondary | ICD-10-CM | POA: Diagnosis not present

## 2019-07-12 ENCOUNTER — Ambulatory Visit
Admission: RE | Admit: 2019-07-12 | Discharge: 2019-07-12 | Disposition: A | Discharge status: Home / Self Care | Payer: Medicare HMO | Source: Ambulatory Visit | Attending: Radiation Oncology | Admitting: Radiation Oncology

## 2019-07-12 ENCOUNTER — Other Ambulatory Visit: Payer: Self-pay

## 2019-07-12 DIAGNOSIS — Z510 Encounter for antineoplastic radiation therapy: Secondary | ICD-10-CM | POA: Diagnosis not present

## 2019-07-12 DIAGNOSIS — C61 Malignant neoplasm of prostate: Secondary | ICD-10-CM | POA: Diagnosis not present

## 2019-07-13 ENCOUNTER — Telehealth: Payer: Self-pay | Admitting: Family Medicine

## 2019-07-13 ENCOUNTER — Other Ambulatory Visit: Payer: Self-pay

## 2019-07-13 ENCOUNTER — Ambulatory Visit
Admission: RE | Admit: 2019-07-13 | Discharge: 2019-07-13 | Disposition: A | Discharge status: Home / Self Care | Payer: Medicare HMO | Source: Ambulatory Visit | Attending: Radiation Oncology | Admitting: Radiation Oncology

## 2019-07-13 DIAGNOSIS — C61 Malignant neoplasm of prostate: Secondary | ICD-10-CM | POA: Diagnosis not present

## 2019-07-13 DIAGNOSIS — Z510 Encounter for antineoplastic radiation therapy: Secondary | ICD-10-CM | POA: Diagnosis not present

## 2019-07-13 NOTE — Telephone Encounter (Signed)
I called the patient to schedule AWV with Loma Sousa after visit with Dr. [name] next week.  He said that he's been getting radiation every morning and this final treatment is on the 22nd.  He's wondering if Dr. [name] wants him to wait until after his treatment is over before he comes in for his follow up visit. He's ok with it either way. Please advise.

## 2019-07-14 ENCOUNTER — Ambulatory Visit
Admission: RE | Admit: 2019-07-14 | Discharge: 2019-07-14 | Disposition: A | Discharge status: Home / Self Care | Payer: Medicare HMO | Source: Ambulatory Visit | Attending: Radiation Oncology | Admitting: Radiation Oncology

## 2019-07-14 ENCOUNTER — Other Ambulatory Visit: Payer: Self-pay

## 2019-07-14 DIAGNOSIS — C61 Malignant neoplasm of prostate: Secondary | ICD-10-CM | POA: Diagnosis not present

## 2019-07-14 DIAGNOSIS — Z510 Encounter for antineoplastic radiation therapy: Secondary | ICD-10-CM | POA: Diagnosis not present

## 2019-07-14 NOTE — Telephone Encounter (Signed)
Okay for pt to wait after 07/23/2019 for AWV?   Sent to me by mistake

## 2019-07-14 NOTE — Telephone Encounter (Signed)
If he is requesting to move this back-I am okay with that.  If you prefer to still have a visit I am also okay with that.

## 2019-07-14 NOTE — Telephone Encounter (Signed)
See message from Dr [name].

## 2019-07-17 ENCOUNTER — Telehealth: Payer: Self-pay | Admitting: Family Medicine

## 2019-07-17 ENCOUNTER — Ambulatory Visit
Admission: RE | Admit: 2019-07-17 | Discharge: 2019-07-17 | Disposition: A | Discharge status: Home / Self Care | Payer: Medicare HMO | Source: Ambulatory Visit | Attending: Radiation Oncology | Admitting: Radiation Oncology

## 2019-07-17 ENCOUNTER — Other Ambulatory Visit: Payer: Self-pay

## 2019-07-17 DIAGNOSIS — Z510 Encounter for antineoplastic radiation therapy: Secondary | ICD-10-CM | POA: Diagnosis not present

## 2019-07-17 DIAGNOSIS — C61 Malignant neoplasm of prostate: Secondary | ICD-10-CM | POA: Diagnosis not present

## 2019-07-17 NOTE — Telephone Encounter (Signed)
error 

## 2019-07-17 NOTE — Telephone Encounter (Signed)
I left a message explaining that Dr. [name] is ok with him either keeping the same appointment or rescheduling. I asked him to call and let us know if he decides to reschedule.

## 2019-07-18 ENCOUNTER — Ambulatory Visit
Admission: RE | Admit: 2019-07-18 | Discharge: 2019-07-18 | Disposition: A | Discharge status: Home / Self Care | Payer: Medicare HMO | Source: Ambulatory Visit | Attending: Radiation Oncology | Admitting: Radiation Oncology

## 2019-07-18 ENCOUNTER — Other Ambulatory Visit: Payer: Self-pay

## 2019-07-18 DIAGNOSIS — Z510 Encounter for antineoplastic radiation therapy: Secondary | ICD-10-CM | POA: Diagnosis not present

## 2019-07-18 DIAGNOSIS — C61 Malignant neoplasm of prostate: Secondary | ICD-10-CM | POA: Diagnosis not present

## 2019-07-19 ENCOUNTER — Ambulatory Visit
Admission: RE | Admit: 2019-07-19 | Discharge: 2019-07-19 | Disposition: A | Discharge status: Home / Self Care | Payer: Medicare HMO | Source: Ambulatory Visit | Attending: Radiation Oncology | Admitting: Radiation Oncology

## 2019-07-19 ENCOUNTER — Other Ambulatory Visit: Payer: Self-pay

## 2019-07-19 DIAGNOSIS — C61 Malignant neoplasm of prostate: Secondary | ICD-10-CM | POA: Diagnosis not present

## 2019-07-19 DIAGNOSIS — Z510 Encounter for antineoplastic radiation therapy: Secondary | ICD-10-CM | POA: Diagnosis not present

## 2019-07-20 ENCOUNTER — Ambulatory Visit (INDEPENDENT_AMBULATORY_CARE_PROVIDER_SITE_OTHER): Payer: Medicare HMO | Admitting: Family Medicine

## 2019-07-20 ENCOUNTER — Telehealth: Payer: Self-pay | Admitting: Family Medicine

## 2019-07-20 ENCOUNTER — Encounter: Payer: Self-pay | Admitting: Family Medicine

## 2019-07-20 ENCOUNTER — Other Ambulatory Visit: Payer: Self-pay

## 2019-07-20 ENCOUNTER — Ambulatory Visit (INDEPENDENT_AMBULATORY_CARE_PROVIDER_SITE_OTHER): Payer: Medicare HMO

## 2019-07-20 ENCOUNTER — Ambulatory Visit
Admission: RE | Admit: 2019-07-20 | Discharge: 2019-07-20 | Disposition: A | Discharge status: Home / Self Care | Payer: Medicare HMO | Source: Ambulatory Visit | Attending: Radiation Oncology | Admitting: Radiation Oncology

## 2019-07-20 VITALS — BP 108/62 | Temp 98.1°F | Ht 74.0 in | Wt 199.4 lb

## 2019-07-20 VITALS — BP 108/62 | HR 84 | Temp 98.1°F | Ht 73.5 in | Wt 199.4 lb

## 2019-07-20 DIAGNOSIS — F324 Major depressive disorder, single episode, in partial remission: Secondary | ICD-10-CM | POA: Diagnosis not present

## 2019-07-20 DIAGNOSIS — C61 Malignant neoplasm of prostate: Secondary | ICD-10-CM

## 2019-07-20 DIAGNOSIS — I10 Essential (primary) hypertension: Secondary | ICD-10-CM

## 2019-07-20 DIAGNOSIS — Z0000 Encounter for general adult medical examination without abnormal findings: Secondary | ICD-10-CM | POA: Diagnosis not present

## 2019-07-20 DIAGNOSIS — E785 Hyperlipidemia, unspecified: Secondary | ICD-10-CM

## 2019-07-20 DIAGNOSIS — M10079 Idiopathic gout, unspecified ankle and foot: Secondary | ICD-10-CM | POA: Diagnosis not present

## 2019-07-20 DIAGNOSIS — Z510 Encounter for antineoplastic radiation therapy: Secondary | ICD-10-CM | POA: Diagnosis not present

## 2019-07-20 LAB — CBC WITH DIFFERENTIAL/PLATELET
Basophils Absolute: 0.1 10*3/uL (ref 0.0–0.1)
Basophils Relative: 0.8 % (ref 0.0–3.0)
Eosinophils Absolute: 0.5 10*3/uL (ref 0.0–0.7)
Eosinophils Relative: 7.3 % — ABNORMAL HIGH (ref 0.0–5.0)
HCT: 38.7 % — ABNORMAL LOW (ref 39.0–52.0)
Hemoglobin: 13.8 g/dL (ref 13.0–17.0)
Lymphocytes Relative: 11.1 % — ABNORMAL LOW (ref 12.0–46.0)
Lymphs Abs: 0.8 10*3/uL (ref 0.7–4.0)
MCHC: 35.7 g/dL (ref 30.0–36.0)
MCV: 89.8 fl (ref 78.0–100.0)
Monocytes Absolute: 0.5 10*3/uL (ref 0.1–1.0)
Monocytes Relative: 7.5 % (ref 3.0–12.0)
Neutro Abs: 5.1 10*3/uL (ref 1.4–7.7)
Neutrophils Relative %: 73.3 % (ref 43.0–77.0)
Platelets: 218 10*3/uL (ref 150.0–400.0)
RBC: 4.31 Mil/uL (ref 4.22–5.81)
RDW: 14 % (ref 11.5–15.5)
WBC: 6.9 10*3/uL (ref 4.0–10.5)

## 2019-07-20 LAB — COMPREHENSIVE METABOLIC PANEL
ALT: 35 U/L (ref 0–53)
AST: 19 U/L (ref 0–37)
Albumin: 4.1 g/dL (ref 3.5–5.2)
Alkaline Phosphatase: 51 U/L (ref 39–117)
BUN: 21 mg/dL (ref 6–23)
CO2: 26 mEq/L (ref 19–32)
Calcium: 9.5 mg/dL (ref 8.4–10.5)
Chloride: 106 mEq/L (ref 96–112)
Creatinine, Ser: 1.08 mg/dL (ref 0.40–1.50)
GFR: 67.36 mL/min (ref >60.00)
Glucose, Bld: 106 mg/dL — ABNORMAL HIGH (ref 70–99)
Potassium: 4.5 mEq/L (ref 3.5–5.1)
Sodium: 142 mEq/L (ref 135–145)
Total Bilirubin: 0.7 mg/dL (ref 0.2–1.2)
Total Protein: 6 g/dL (ref 6.0–8.3)

## 2019-07-20 LAB — LDL CHOLESTEROL, DIRECT: Direct LDL: 109 mg/dL

## 2019-07-20 NOTE — Telephone Encounter (Signed)
Pleased advise.  

## 2019-07-20 NOTE — Progress Notes (Signed)
I have reviewed and agree with note, evaluation, plan.   Shawntavia Saunders, MD  

## 2019-07-20 NOTE — Patient Instructions (Signed)
[name] , Thank you for taking time to come for your Medicare Wellness Visit. I appreciate your ongoing commitment to your health goals. Please review the following plan we discussed and let me know if I can assist you in the future.   Screening recommendations/referrals: Colorectal Screening: up to date; last 01/16/19 with Dr. Fuller Plan   Vision and Dental Exams: Recommended annual ophthalmology exams for early detection of glaucoma and other disorders of the eye Recommended annual dental exams for proper oral hygiene  Vaccinations: Influenza vaccine: completed 05/10/19 Pneumococcal vaccine: up to date; last 10/16/16  Tdap vaccine: up to date; last 02/05/15 Shingles vaccine: Please call your insurance company to determine your out of pocket expense for the Shingrix vaccine. You may receive this vaccine at your local pharmacy.  Advanced directives: Please bring a copy of your POA (Power of Attorney) and/or Living Will to your next appointment.  Goals: Recommend to drink at least 6-8 8oz glasses of water per day and consume a balanced diet rich in fresh fruits and vegetables.   Next appointment: Please schedule your Annual Wellness Visit with your Nurse Health Advisor in one year.  Preventive Care 71 Years and Older, Male Preventive care refers to lifestyle choices and visits with your health care provider that can promote health and wellness. What does preventive care include?  A yearly physical exam. This is also called an annual well check.  Dental exams once or twice a year.  Routine eye exams. Ask your health care provider how often you should have your eyes checked.  Personal lifestyle choices, including:  Daily care of your teeth and gums.  Regular physical activity.  Eating a healthy diet.  Avoiding tobacco and drug use.  Limiting alcohol use.  Practicing safe sex.  Taking low doses of aspirin every day if recommended by your health care provider..  Taking vitamin and  mineral supplements as recommended by your health care provider. What happens during an annual well check? The services and screenings done by your health care provider during your annual well check will depend on your age, overall health, lifestyle risk factors, and family history of disease. Counseling  Your health care provider may ask you questions about your:  Alcohol use.  Tobacco use.  Drug use.  Emotional well-being.  Home and relationship well-being.  Sexual activity.  Eating habits.  History of falls.  Memory and ability to understand (cognition).  Work and work Statistician. Screening  You may have the following tests or measurements:  Height, weight, and BMI.  Blood pressure.  Lipid and cholesterol levels. These may be checked every 5 years, or more frequently if you are over 59 years old.  Skin check.  Lung cancer screening. You may have this screening every year starting at age 74 if you have a 30-pack-year history of smoking and currently smoke or have quit within the past 15 years.  Fecal occult blood test (FOBT) of the stool. You may have this test every year starting at age 28.  Flexible sigmoidoscopy or colonoscopy. You may have a sigmoidoscopy every 5 years or a colonoscopy every 10 years starting at age 53.  Prostate cancer screening. Recommendations will vary depending on your family history and other risks.  Hepatitis C blood test.  Hepatitis B blood test.  Sexually transmitted disease (STD) testing.  Diabetes screening. This is done by checking your blood sugar (glucose) after you have not eaten for a while (fasting). You may have this done every 1-3 years.  Abdominal aortic aneurysm (AAA) screening. You may need this if you are a current or former smoker.  Osteoporosis. You may be screened starting at age 66 if you are at high risk. Talk with your health care provider about your test results, treatment options, and if necessary, the need  for more tests. Vaccines  Your health care provider may recommend certain vaccines, such as:  Influenza vaccine. This is recommended every year.  Tetanus, diphtheria, and acellular pertussis (Tdap, Td) vaccine. You may need a Td booster every 10 years.  Zoster vaccine. You may need this after age 27.  Pneumococcal 13-valent conjugate (PCV13) vaccine. One dose is recommended after age 81.  Pneumococcal polysaccharide (PPSV23) vaccine. One dose is recommended after age 47. Talk to your health care provider about which screenings and vaccines you need and how often you need them. This information is not intended to replace advice given to you by your health care provider. Make sure you discuss any questions you have with your health care provider. Document Released: 09/13/2015 Document Revised: 05/06/2016 Document Reviewed: 06/18/2015 Elsevier Interactive Patient Education  2017 [employer] Prevention in the Home Falls can cause injuries. They can happen to people of all ages. There are many things you can do to make your home safe and to help prevent falls. What can I do on the outside of my home?  Regularly fix the edges of walkways and driveways and fix any cracks.  Remove anything that might make you trip as you walk through a door, such as a raised step or threshold.  Trim any bushes or trees on the path to your home.  Use bright outdoor lighting.  Clear any walking paths of anything that might make someone trip, such as rocks or tools.  Regularly check to see if handrails are loose or broken. Make sure that both sides of any steps have handrails.  Any raised decks and porches should have guardrails on the edges.  Have any leaves, snow, or ice cleared regularly.  Use sand or salt on walking paths during winter.  Clean up any spills in your garage right away. This includes oil or grease spills. What can I do in the bathroom?  Use night lights.  Install grab bars  by the toilet and in the tub and shower. Do not use towel bars as grab bars.  Use non-skid mats or decals in the tub or shower.  If you need to sit down in the shower, use a plastic, non-slip stool.  Keep the floor dry. Clean up any water that spills on the floor as soon as it happens.  Remove soap buildup in the tub or shower regularly.  Attach bath mats securely with double-sided non-slip rug tape.  Do not have throw rugs and other things on the floor that can make you trip. What can I do in the bedroom?  Use night lights.  Make sure that you have a light by your bed that is easy to reach.  Do not use any sheets or blankets that are too big for your bed. They should not hang down onto the floor.  Have a firm chair that has side arms. You can use this for support while you get dressed.  Do not have throw rugs and other things on the floor that can make you trip. What can I do in the kitchen?  Clean up any spills right away.  Avoid walking on wet floors.  Keep items that you use  a lot in easy-to-reach places.  If you need to reach something above you, use a strong step stool that has a grab bar.  Keep electrical cords out of the way.  Do not use floor polish or wax that makes floors slippery. If you must use wax, use non-skid floor wax.  Do not have throw rugs and other things on the floor that can make you trip. What can I do with my stairs?  Do not leave any items on the stairs.  Make sure that there are handrails on both sides of the stairs and use them. Fix handrails that are broken or loose. Make sure that handrails are as long as the stairways.  Check any carpeting to make sure that it is firmly attached to the stairs. Fix any carpet that is loose or worn.  Avoid having throw rugs at the top or bottom of the stairs. If you do have throw rugs, attach them to the floor with carpet tape.  Make sure that you have a light switch at the top of the stairs and the  bottom of the stairs. If you do not have them, ask someone to add them for you. What else can I do to help prevent falls?  Wear shoes that:  Do not have high heels.  Have rubber bottoms.  Are comfortable and fit you well.  Are closed at the toe. Do not wear sandals.  If you use a stepladder:  Make sure that it is fully opened. Do not climb a closed stepladder.  Make sure that both sides of the stepladder are locked into place.  Ask someone to hold it for you, if possible.  Clearly mark and make sure that you can see:  Any grab bars or handrails.  First and last steps.  Where the edge of each step is.  Use tools that help you move around (mobility aids) if they are needed. These include:  Canes.  Walkers.  Scooters.  Crutches.  Turn on the lights when you go into a dark area. Replace any light bulbs as soon as they burn out.  Set up your furniture so you have a clear path. Avoid moving your furniture around.  If any of your floors are uneven, fix them.  If there are any pets around you, be aware of where they are.  Review your medicines with your doctor. Some medicines can make you feel dizzy. This can increase your chance of falling. Ask your doctor what other things that you can do to help prevent falls. This information is not intended to replace advice given to you by your health care provider. Make sure you discuss any questions you have with your health care provider. Document Released: 06/13/2009 Document Revised: 01/23/2016 Document Reviewed: 09/21/2014 Elsevier Interactive Patient Education  2017 [employer].

## 2019-07-20 NOTE — Patient Instructions (Addendum)
Please stop by lab before you go If you do not have mychart- we will call you about results within 5 business days of [place] receiving them.  If you have mychart- we will send your results within 3 business days of [place] receiving them.  If abnormal or we want to clarify a result, we will call or mychart you to make sure you receive the message.  If you have questions or concerns or don't hear within 5-7 days, please send [place] a message or call us.    Recommended follow up: 6 month physical or sooner if needed

## 2019-07-20 NOTE — Telephone Encounter (Signed)
Patient said he discussed medication options with Dr. [name], but there were a couple of options. He is wanting Dr. [name] to send in a prescription for the one he recommends. To  [id number] - [place], [place] - [address]   Thank you!

## 2019-07-20 NOTE — Progress Notes (Signed)
Phone [phone] In person visit   Subjective:   Johnathan Sosa is a 73 y.o. year old very pleasant male patient who presents for/with See problem oriented charting Chief Complaint  Patient presents with  . Follow-up  . Hypertension  . Hyperlipidemia    ROS- No chest pain or shortness of breath. No headache or blurry vision.    Past Medical History-  Patient Active Problem List   Diagnosis Date Noted  . Unintentional weight loss 12/21/2017    Priority: High  . Prostate cancer ([place]) 07/13/2017    Priority: High  . History of CVA (cerebrovascular accident) 07/20/2016    Priority: High  . Closed TBI (traumatic brain injury) ([place]) 10/24/2014    Priority: High  . 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 Memory loss     Priority: High  . History of adenomatous polyp of colon 01/25/2019    Priority: Medium  . Attention deficit hyperactivity disorder (ADHD) 07/06/2016    Priority: Medium  . Gout     Priority: Medium  . Insomnia     Priority: Medium  . Hyperlipidemia     Priority: Medium  . Depression, major, single episode, in partial remission ([place])     Priority: Medium  . HTN (hypertension) 04/29/2012    Priority: Medium  . Former smoker 10/01/2015    Priority: Low  . Erectile dysfunction 10/01/2015    Priority: Low  . Migraine headache     Priority: Low  . Abnormal eye movements 10/24/2014    Priority: Low  . 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 GERD (gastroesophageal reflux disease)     Priority: Low  . Recurrent falls while walking 02/06/2019  . MCI (mild cognitive impairment) with memory loss 02/06/2019  . Hypersomnia with sleep apnea 02/06/2019  . Excessive daytime sleepiness 06/07/2018  . MCI (mild cognitive impairment) 06/07/2018  . Fatigue due to depression 06/07/2018  . Aortic atherosclerosis ([place]) 12/21/2017  . Ileus ([place]) 09/04/2017  . 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 BPPV (benign paroxysmal positional vertigo) 12/21/2016  . Screening for prostate cancer 10/16/2016  . Palpitations 07/20/2016    Medications- reviewed and updated Current Outpatient Medications  Medication Sig Dispense Refill  . allopurinol (ZYLOPRIM) 300 MG tablet TAKE 1 TABLET EVERY DAY 90 tablet 3  . ALPRAZolam (XANAX) 0.25 MG tablet TAKE 1 TABLET BY MOUTH TWICE DAILY AS NEEDED FOR VERTIGO. DO NOT EXCEED 2 TABS DAILY 60 tablet 0  . amLODipine (NORVASC) 2.5 MG tablet TAKE 1 TABLET EVERY DAY 90 tablet 3  . buPROPion (WELLBUTRIN XL) 300 MG 24 hr tablet TAKE 1 TABLET EVERY DAY 90 tablet 1  . citalopram (CELEXA) 10 MG tablet Take 1 tablet (10 mg total) by mouth daily. Per Dr. Brett Fairy per patient 90 tablet 3  . clonazePAM (KLONOPIN) 1 MG tablet TAKE 1 TABLET BY MOUTH EVERY DAY AT BEDTIME AS NEEDED 30 tablet 2   No current facility-administered medications for this visit.      Objective:  BP 108/62   Pulse 84   Temp 98.1 F (36.7 C)   Ht 6' 1.5" (1.867 m)   Wt 199 lb 6.4 oz (90.4 kg)   SpO2 97%   BMI 25.95 kg/m  Gen: NAD, resting comfortably CV: RRR no murmurs rubs or gallops Lungs: CTAB no crackles, wheeze, rhonchi Abdomen: soft/nontender/nondistended/normal bowel sounds.  Ext: no edema Skin: warm, dry  Assessment and Plan   # currently on radiation therapy and draining his energy levels for recurrent prostate cancer  #hypertension S: compliant with amlodipine 2.5mg .  Orthostatic issues if he does not stay well-hydrated. BP Readings from Last 3 Encounters:  07/20/19 108/62  04/11/19 120/80  02/06/19 122/75  A/P:  Stable. Continue current medications.    #hyperlipidemia S: pt states he has stopped taking his Lipitor 10 mg (had discussed increasing dose but he wanted to work on lifestyle first) b/c he does not feel that is something he should be taking after doing research on the medicine.  We discussed today with his history of stroke that statin is extremely  important and ideal LDL goal would be under 70. Lab Results  Component Value Date   CHOL 147 11/11/2018   HDL 33.30 (L) 11/11/2018   LDLCALC 71 10/08/2017   LDLDIRECT 93.0 11/11/2018   TRIG 235.0 (H) 11/11/2018   CHOLHDL 4 11/11/2018   A/P: Poor control-strongly recommended restarting a statin due to history of stroke-patient declines despite risk of recurrent stroke or even death -hed be willing to consider weaker statin- wrote down some names for him- livalo and lovastatin  #Gout S: 0 flares recently on allopurinol 300mg  Lab Results  Component Value Date   LABURIC 4.6 11/11/2018  A/P: Uric acid is appropriately controlled.  Continue current medication.  # Depression S: No anhedonia or depressed mood.  Points on PHQ-9 likely related to recent prostate cancer diagnosis and radiation.  He is compliant with Celexa/citalopram 10 mg and Wellbutrin 150 mg Depression screen [facility] 2/9 07/20/2019  Decreased Interest 0  Down, Depressed, Hopeless 0  PHQ - 2 Score 0  Altered sleeping 0  Tired, decreased energy 3  Change in appetite 2  Feeling bad or failure about yourself  1  Trouble concentrating 0  Moving slowly or fidgety/restless 0  Suicidal thoughts 0  PHQ-9 Score 6  Difficult doing work/chores Somewhat difficult  A/P:  Reasonably stable- continue current medicine. Will call partial remission but factoring out prostate cancer likely full remission.   # weight largely stable over last 3 months- period in past wher ewas losing a lot of weight but extensive workup unrevealing   Recommended follow up: 6 month physical or sooner if needed Future Appointments  Date Time Provider [place]  07/20/2019  2:00 PM Bernita Raisin, LPN [facility]  [id number] 11:00 AM [facility] 4 [facility] None  07/24/2019 11:30 AM [facility] 4 [facility] None  07/25/2019 11:30 AM [facility] 4 [facility] None  08/15/2019  2:00 PM [place], Jinny Blossom, NP [facility] None   08/31/2019  1:30 PM Bruning, Ashlyn, PA-C [facility] None    Lab/Order associations:   ICD-10-CM   1. Hyperlipidemia, unspecified hyperlipidemia type  E78.5 CBC with Differential    Comprehensive metabolic panel    Direct LDL  2. Essential hypertension  I10   3. Idiopathic gout of foot, unspecified chronicity, unspecified laterality  M10.079   4. Depression, major, single episode, in partial remission ([place])  F32.4   5. Prostate cancer ([place])  C61     No orders of the defined types were placed in this encounter.   Return precautions advised.  [name], MD
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
## 2019-07-21 ENCOUNTER — Ambulatory Visit: Payer: Medicare HMO

## 2019-07-21 ENCOUNTER — Ambulatory Visit
Admission: RE | Admit: 2019-07-21 | Discharge: 2019-07-21 | Disposition: A | Discharge status: Home / Self Care | Payer: Medicare HMO | Source: Ambulatory Visit | Attending: Radiation Oncology | Admitting: Radiation Oncology

## 2019-07-21 ENCOUNTER — Telehealth: Payer: Self-pay

## 2019-07-21 DIAGNOSIS — Z510 Encounter for antineoplastic radiation therapy: Secondary | ICD-10-CM | POA: Diagnosis not present

## 2019-07-21 DIAGNOSIS — C61 Malignant neoplasm of prostate: Secondary | ICD-10-CM | POA: Diagnosis not present

## 2019-07-23 ENCOUNTER — Encounter: Payer: Self-pay | Admitting: Radiation Oncology

## 2019-07-23 ENCOUNTER — Ambulatory Visit
Admission: RE | Admit: 2019-07-23 | Discharge: 2019-07-23 | Disposition: A | Discharge status: Home / Self Care | Payer: Medicare HMO | Source: Ambulatory Visit | Attending: Radiation Oncology | Admitting: Radiation Oncology

## 2019-07-23 DIAGNOSIS — Z510 Encounter for antineoplastic radiation therapy: Secondary | ICD-10-CM | POA: Diagnosis not present

## 2019-07-23 DIAGNOSIS — C61 Malignant neoplasm of prostate: Secondary | ICD-10-CM | POA: Diagnosis not present

## 2019-07-24 ENCOUNTER — Ambulatory Visit: Payer: Medicare HMO

## 2019-07-24 NOTE — Telephone Encounter (Signed)
Do not see any message do you know what was needed?

## 2019-07-25 ENCOUNTER — Ambulatory Visit: Payer: Medicare HMO

## 2019-08-03 ENCOUNTER — Other Ambulatory Visit: Payer: Self-pay | Admitting: Family Medicine

## 2019-08-15 ENCOUNTER — Other Ambulatory Visit: Payer: Self-pay

## 2019-08-15 ENCOUNTER — Encounter: Payer: Self-pay | Admitting: Adult Health

## 2019-08-15 ENCOUNTER — Ambulatory Visit (INDEPENDENT_AMBULATORY_CARE_PROVIDER_SITE_OTHER): Payer: Medicare HMO | Admitting: Adult Health

## 2019-08-15 VITALS — BP 108/76 | HR 80 | Temp 97.3°F | Ht 73.5 in | Wt 201.2 lb

## 2019-08-15 DIAGNOSIS — S069XAA Unspecified intracranial injury with loss of consciousness status unknown, initial encounter: Secondary | ICD-10-CM

## 2019-08-15 DIAGNOSIS — G3184 Mild cognitive impairment, so stated: Secondary | ICD-10-CM | POA: Diagnosis not present

## 2019-08-15 DIAGNOSIS — R296 Repeated falls: Secondary | ICD-10-CM | POA: Diagnosis not present

## 2019-08-15 DIAGNOSIS — S069X9A Unspecified intracranial injury with loss of consciousness of unspecified duration, initial encounter: Secondary | ICD-10-CM | POA: Diagnosis not present

## 2019-08-15 DIAGNOSIS — R42 Dizziness and giddiness: Secondary | ICD-10-CM
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
## 2019-08-15 NOTE — Patient Instructions (Signed)
Your Plan:  Continue Celexa and Xanax Memory score is 26/30 will continue to monitor If your symptoms worsen or you develop new symptoms please let us know.    Thank you for coming to see [place] at [facility]. I hope we have been able to provide you high quality care today.  You may receive a patient satisfaction survey over the next few weeks. We would appreciate your feedback and comments so that we may continue to improve ourselves and the health of our patients.

## 2019-08-20 NOTE — Progress Notes (Signed)
  Radiation Oncology         [phone]) [phone] ________________________________  Name: Johnathan Sosa MRN: [id number]  Date: 07/23/2019  DOB: [birth year]  End of Treatment Note  Diagnosis:    73 y.o. gentleman with biochemical recurrence of locally advanced prostate cancer with PSA of 0.19 s/p RALP for Stage pT2c, pN0, Gleason 4+4 adenocarcinoma of the prostate     Indication for treatment:  Curative, Definitive Radiotherapy       Radiation treatment dates:   05/31/19-07/23/19  Site/dose:  1. The prostate fossa and pelvic lymph nodes were initially treated to 45 Gy in 25 fractions of 1.8 Gy  2. The prostate fossa only was boosted to 68.4 Gy with 13 additional fractions of 1.8 Gy   Beams/energy:  1. The prostate fossa  and pelvic lymph nodes were initially treated using VMAT intensity modulated radiotherapy delivering 6 megavolt photons. Image guidance was performed with CB-CT studies prior to each fraction. He was immobilized with a body fix lower extremity mold.  2. The prostate fossa only was boosted using VMAT intensity modulated radiotherapy delivering 6 megavolt photons. Image guidance was performed with CB-CT studies prior to each fraction. He was immobilized with a body fix lower extremity mold.  Narrative: The patient tolerated radiation treatment relatively well.   The patient experienced some minor urinary irritation and modest fatigue.    Plan: The patient has completed radiation treatment. He will return to radiation oncology clinic for routine followup in one month. I advised him to call or return sooner if he has any questions or concerns related to his recovery or treatment. ________________________________  Sheral Apley. Tammi Klippel, M.D.

## 2019-08-30 NOTE — Progress Notes (Signed)
Radiation Oncology         [phone] ________________________________  Name: Johnathan Sosa MRN: [id number]  Date: 08/31/2019  DOB: [birth year]  Post Treatment Note  CC: Marin Olp, MD  Raynelle Bring, MD  Diagnosis:   73 y.o. gentleman with biochemical recurrence of locally advanced prostate cancer with PSA of 0.19 s/p RALP for Stage pT2c, pN0, Gleason 4+4 adenocarcinoma of the prostate   Interval Since Last Radiation:  5.5 weeks  05/31/19-07/23/19: (Concurrent with ADT) 1. The prostate fossa and pelvic lymph nodes were initially treated to 45 Gy in 25 fractions of 1.8 Gy  2. The prostate fossa only was boosted to 68.4 Gy with 13 additional fractions of 1.8 Gy    Narrative:  I spoke with the patient to conduct his routine scheduled 1 month follow up visit via telephone to spare the patient unnecessary potential exposure in the healthcare setting during the current COVID-19 pandemic.  The patient was notified in advance and gave permission to proceed with this visit format. He tolerated radiation treatment relatively well.   The patient experienced some minor urinary irritation and modest fatigue.                                On review of systems, the patient states that he is doing well overall.  He has noted gradual improvement in his LUTS and specifically denies dysuria or gross hematuria.  He continues with increased frequency, urgency and hesitancy but symptoms are tolerable.  He reports a moderate FOS and feels that he empties his bladder well on voiding for the most part.  He denies abdominal pain, nausea, vomiting, diarrhea or constipation.  He reports a healthy appetite and is maintaining his weight.  Overall, he is quite pleased with his progress to date.  ALLERGIES:  is allergic to black pepper [piper]; ritalin [methylphenidate hcl]; and sulfa antibiotics.  Meds: Current Outpatient Medications  Medication Sig Dispense Refill  . allopurinol (ZYLOPRIM) 300 MG tablet TAKE  1 TABLET EVERY DAY 90 tablet 3  . ALPRAZolam (XANAX) 0.25 MG tablet TAKE 1 TABLET BY MOUTH TWICE DAILY AS NEEDED FOR VERTIGO. DO NOT EXCEED 2 TABS DAILY 60 tablet 0  . amLODipine (NORVASC) 2.5 MG tablet TAKE 1 TABLET EVERY DAY 90 tablet 3  . aspirin EC 81 MG tablet Take 81 mg by mouth daily.    [name] Kitchen atorvastatin (LIPITOR) 10 MG tablet TAKE 1 TABLET EVERY DAY 90 tablet 3  . buPROPion (WELLBUTRIN XL) 300 MG 24 hr tablet TAKE 1 TABLET EVERY DAY 90 tablet 1  . citalopram (CELEXA) 10 MG tablet Take 1 tablet (10 mg total) by mouth daily. Per Dr. Brett Fairy per patient 90 tablet 3  . clonazePAM (KLONOPIN) 1 MG tablet TAKE 1 TABLET BY MOUTH EVERY DAY AT BEDTIME AS NEEDED 30 tablet 2   No current facility-administered medications for this visit.    Physical Findings:  vitals were not taken for this visit.   /Unable to assess due to telephone follow up visit format.  Lab Findings: Lab Results  Component Value Date   WBC 6.9 07/20/2019   HGB 13.8 07/20/2019   HCT 38.7 (L) 07/20/2019   MCV 89.8 07/20/2019   PLT 218.0 07/20/2019     Radiographic Findings: No results found.  Impression/Plan: 1. 73 y.o. gentleman with biochemical recurrence of locally advanced prostate cancer with PSA of 0.19 s/p RALP for Stage pT2c, pN0, Gleason 4+4 adenocarcinoma  of the prostate.  He will continue to follow up with urology for ongoing PSA determinations and has an appointment scheduled with Dr. Dr. Alinda Money in March 2021. He understands what to expect with regards to PSA monitoring going forward. I will look forward to following his response to treatment via correspondence with urology, and would be happy to continue to participate in his care if clinically indicated. I talked to the patient about what to expect in the future, including his risk for erectile dysfunction and rectal bleeding. I encouraged him to call or return to the office if he has any questions regarding his previous radiation or possible radiation side  effects. He was comfortable with this plan and will follow up as needed.    Nicholos Johns, PA-C

## 2019-08-31 ENCOUNTER — Other Ambulatory Visit: Payer: Self-pay | Admitting: Neurology

## 2019-08-31 ENCOUNTER — Ambulatory Visit
Admission: RE | Admit: 2019-08-31 | Discharge: 2019-08-31 | Disposition: A | Discharge status: Home / Self Care | Payer: Medicare HMO | Source: Ambulatory Visit | Attending: Urology | Admitting: Urology

## 2019-08-31 DIAGNOSIS — R42 Dizziness and giddiness: Secondary | ICD-10-CM

## 2019-08-31 DIAGNOSIS — S069XAA Unspecified intracranial injury with loss of consciousness status unknown, initial encounter: Secondary | ICD-10-CM

## 2019-08-31 DIAGNOSIS — C61 Malignant neoplasm of prostate: Secondary | ICD-10-CM

## 2019-09-04 NOTE — Telephone Encounter (Signed)
Called [facility] because drug registry showing last refill showing 12/14/17 #60. Unable to reach someone. Will try again tomorrow.   Pt last seen 08/15/19 and next f/u 02/13/20

## 2019-09-07 ENCOUNTER — Other Ambulatory Visit: Payer: Self-pay | Admitting: Neurology

## 2019-09-19 ENCOUNTER — Other Ambulatory Visit: Payer: Self-pay | Admitting: Family Medicine

## 2019-10-14 ENCOUNTER — Telehealth: Payer: Self-pay | Admitting: Family Medicine

## 2019-10-16 NOTE — Telephone Encounter (Signed)
Nurse Assessment Nurse: Marcello Moores, RN, Cheri Date/Time (Eastern Time): 10/14/2019 7:28:15 PM Confirm and document reason for call. If symptomatic, describe symptoms. ---Caller states that he wants a refill called in for his clonazepam. Denies any symptoms at this time. Has the patient had close contact with a person known or suspected to have the novel coronavirus illness OR traveled / lives in area with major community spread (including international travel) in the last 14 days from the onset of symptoms? * If Asymptomatic, screen for exposure and travel within the last 14 days. ---No Does the patient have any new or worsening symptoms? ---No Guidelines Guideline Title Affirmed Question Affirmed Notes Nurse Date/Time (Eastern Time) Disp. Time Eilene Ghazi Time) Disposition Final User 10/14/2019 7:32:43 PM Clinical Call Yes Marcello Moores, RN, Cheri Comments User: Marvis Repress, RN Date/Time Eilene Ghazi Time): 10/14/2019 7:32:21 PM Recommended patient contact his provider office during regular office hours for medication refills and all narcotic medication refills per client directive.

## 2019-10-17 NOTE — Telephone Encounter (Signed)
LAST APPOINTMENT DATE: @11 /06/2019  NEXT APPOINTMENT DATE:@5 /24/2021   LAST REFILL: 07/20/2019  QTY:30 with 2 rf

## 2019-10-18 ENCOUNTER — Encounter: Payer: Self-pay | Admitting: Internal Medicine

## 2019-10-18 ENCOUNTER — Other Ambulatory Visit: Payer: Self-pay

## 2019-10-18 NOTE — Telephone Encounter (Signed)
  LAST APPOINTMENT DATE: 09/19/2019   NEXT APPOINTMENT DATE:@5 /24/2021  MEDICATION:clonazePAM (KLONOPIN) 1 MG tablet  [id number] [place], [place] - [address]  Comment: Patient called in frustrated because he has gone 4 days without this medication and he has been told multiple times to have it refilled. He states he has not slept and would really like for it to be sent today. Patient would like a call when refilled.   **Let patient know to contact pharmacy at the end of the day to make sure medication is ready. **  ** Please notify patient to allow 48-72 hours to process**  **Encourage patient to contact the pharmacy for refills or they can request refills through [facility]**  CLINICAL FILLS OUT ALL BELOW:   LAST REFILL:  QTY:  REFILL DATE:    OTHER COMMENTS:    Okay for refill?  Please advise

## 2019-10-18 NOTE — Telephone Encounter (Signed)
Request sent to Dr. [name] to fill.

## 2019-11-02 ENCOUNTER — Ambulatory Visit: Payer: Medicare HMO | Attending: Internal Medicine

## 2019-11-02 DIAGNOSIS — Z23 Encounter for immunization: Secondary | ICD-10-CM | POA: Insufficient documentation

## 2019-11-02 NOTE — Progress Notes (Signed)
   Covid-19 Vaccination Clinic  Name:  Tirrell Luellen    MRN: [id number] DOB: 07-07-1946  11/02/2019  Mr. Quintin was observed post Covid-19 immunization for 15 minutes without incident. He was provided with Vaccine Information Sheet and instruction to access the V-Safe system.   Mr. Kerin was instructed to call 911 with any severe reactions post vaccine: [name] Kitchen Difficulty breathing  . Swelling of face and throat  . A fast heartbeat  . A bad rash all over body  . Dizziness and weakness   Immunizations Administered    Name Date Dose VIS Date Route   Pfizer COVID-19 Vaccine 11/02/2019  4:37 PM 0.3 mL 08/11/2019 Intramuscular   Manufacturer: [place]   Lot: [id number]   [place]: [id number]

## 2019-11-10 DIAGNOSIS — C61 Malignant neoplasm of prostate: Secondary | ICD-10-CM | POA: Diagnosis not present

## 2019-11-17 ENCOUNTER — Other Ambulatory Visit: Payer: Self-pay | Admitting: Neurology

## 2019-11-17 DIAGNOSIS — C61 Malignant neoplasm of prostate: Secondary | ICD-10-CM | POA: Diagnosis not present

## 2019-11-17 DIAGNOSIS — N393 Stress incontinence (female) (male): Secondary | ICD-10-CM | POA: Diagnosis not present

## 2019-11-17 DIAGNOSIS — R42 Dizziness and giddiness: Secondary | ICD-10-CM

## 2019-11-28 ENCOUNTER — Ambulatory Visit: Payer: Medicare HMO | Attending: Internal Medicine

## 2019-11-28 DIAGNOSIS — Z23 Encounter for immunization: Secondary | ICD-10-CM

## 2019-11-28 NOTE — Progress Notes (Signed)
   Covid-19 Vaccination Clinic  Name:  Morty Schaal    MRN: [id number] DOB: Jan 28, 1946  11/28/2019  Mr. Masias was observed post Covid-19 immunization for 30 minutes based on pre-vaccination screening without incident. He was provided with Vaccine Information Sheet and instruction to access the V-Safe system.   Mr. Behringer was instructed to call 911 with any severe reactions post vaccine: [name] Kitchen Difficulty breathing  . Swelling of face and throat  . A fast heartbeat  . A bad rash all over body  . Dizziness and weakness   Immunizations Administered    Name Date Dose VIS Date Route   Pfizer COVID-19 Vaccine 11/28/2019 12:28 PM 0.3 mL 08/11/2019 Intramuscular   Manufacturer: [place]   Lot: [id number]   [place]: [id number]

## 2019-11-29 ENCOUNTER — Ambulatory Visit: Payer: Medicare HMO

## 2019-12-07 ENCOUNTER — Other Ambulatory Visit: Payer: Self-pay | Admitting: Family Medicine

## 2019-12-15 ENCOUNTER — Telehealth: Payer: Self-pay | Admitting: Neurology

## 2019-12-15 DIAGNOSIS — R269 Unspecified abnormalities of gait and mobility: Secondary | ICD-10-CM

## 2019-12-15 NOTE — Telephone Encounter (Signed)
The patient called today because of some problem with multiple falls.  It looks like this is has been a chronic problem for him, he was seen in June 2020 for the similar problem.  He had prostate cancer and has had radiation and believes that his walking problems have worsened since that time.  It is not clear from the medical records what the etiology of his gait disorder is.  I will get him set up for physical therapy for gait training, this was ordered in June 2020 but the patient claims that this was never done.  The patient may need a reevaluation for his walking problem.

## 2019-12-18 NOTE — Telephone Encounter (Signed)
Called the patient. There was no answer. LVM informing the patient to call back. Due to insurance it will require most likely the patient having a up to date visit on file where we are able to review his ambulation to justify the need for PT evaluation. LVM advising the patient to call back so that we may get him in for a sooner apt with the NP [name], NP. Per Dr Brett Fairy she is requesting the patient follow up with NP if possible and wanted me to cancel the apt that is scheduled with her in June.   **once I hung up from leaving the VM, I saw where the patient actually had already been scheduled for 4/22 at 3:30 pm with Dr Brett Fairy. If patient calls back see if he is willing to change to NP if there is an opening to offer. If not then we will have to keep as is. I was unaware that his apt was already scheduled for Thursday.

## 2019-12-18 NOTE — Telephone Encounter (Signed)
It may have been the pandemic that caused the PT evaluation not to be done.  Please schedule repeat visit with Deeann Cree, NP- this is an established problems in an established patient.

## 2019-12-19 ENCOUNTER — Other Ambulatory Visit: Payer: Self-pay

## 2019-12-19 ENCOUNTER — Encounter: Payer: Self-pay | Admitting: Adult Health

## 2019-12-19 ENCOUNTER — Telehealth: Payer: Self-pay | Admitting: Adult Health

## 2019-12-19 ENCOUNTER — Ambulatory Visit: Payer: Medicare PPO | Admitting: Adult Health

## 2019-12-19 VITALS — BP 128/84 | HR 77 | Temp 97.2°F | Ht 73.0 in | Wt 201.0 lb

## 2019-12-19 DIAGNOSIS — R269 Unspecified abnormalities of gait and mobility: Secondary | ICD-10-CM

## 2019-12-19 DIAGNOSIS — R296 Repeated falls: Secondary | ICD-10-CM

## 2019-12-19 DIAGNOSIS — R29898 Other symptoms and signs involving the musculoskeletal system: Secondary | ICD-10-CM | POA: Diagnosis not present
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
## 2019-12-19 NOTE — Telephone Encounter (Signed)
Nerve conduction studies with EMG ordered for this patient.  Please call and schedule. Thanks!

## 2019-12-19 NOTE — Telephone Encounter (Signed)
Pt returned call. Pt is addiment to speak to RN about the falls he is having. Please advise.

## 2019-12-19 NOTE — Patient Instructions (Signed)
Your Plan:  Physical therapy ordered Will call you about further testing  Thank you for coming to see [place] at [facility]. I hope we have been able to provide you high quality care today.  You may receive a patient satisfaction survey over the next few weeks. We would appreciate your feedback and comments so that we may continue to improve ourselves and the health of our patients.

## 2019-12-19 NOTE — Telephone Encounter (Signed)
Called the patient back to discuss the need for being seen for insurance purposes to discuss his falls and document his gait and balance concerns for PT eval to be completed.  Pt accepted apt for today with [name], NP at 11:30 am. Pt denies having someone that can come with him to the apt due to his wife caring for someone at this time and his son not available. Advised that we would encourage him to have someone with him to assure he does not fall. Patient states he will be fine and would like to keep the apt and was advised to allow plenty of time to allow him to get in and get checked in by 11am. Pt verbalized understanding.

## 2019-12-20 NOTE — Telephone Encounter (Signed)
I called patient and LVM to schedule NCV/EMG. 

## 2019-12-21 ENCOUNTER — Ambulatory Visit: Payer: Medicare PPO | Admitting: Neurology

## 2019-12-21 ENCOUNTER — Ambulatory Visit: Payer: Medicare HMO | Admitting: Neurology

## 2019-12-21 ENCOUNTER — Ambulatory Visit (INDEPENDENT_AMBULATORY_CARE_PROVIDER_SITE_OTHER): Payer: Medicare PPO | Admitting: Neurology

## 2019-12-21 ENCOUNTER — Other Ambulatory Visit: Payer: Self-pay

## 2019-12-21 DIAGNOSIS — R269 Unspecified abnormalities of gait and mobility: Secondary | ICD-10-CM

## 2019-12-21 DIAGNOSIS — R296 Repeated falls: Secondary | ICD-10-CM

## 2019-12-21 DIAGNOSIS — R29898 Other symptoms and signs involving the musculoskeletal system: Secondary | ICD-10-CM

## 2019-12-21 DIAGNOSIS — R200 Anesthesia of skin: Secondary | ICD-10-CM | POA: Diagnosis not present

## 2019-12-21 DIAGNOSIS — Z0289 Encounter for other administrative examinations: Secondary | ICD-10-CM

## 2019-12-21 NOTE — Progress Notes (Signed)
See procedure note.

## 2019-12-21 NOTE — Procedures (Signed)
Full Name: Johnathan Sosa Gender: Male MRN #: [id number] Date of Birth: 18-Mar-1946    Visit Date: 12/21/2019 10:03 Age: 74 Years Examining Physician: Sarina Ill, MD  Referring Physician: [name], NP Height: 6 feet 1 inch   35.0 C: L foot 36.2 C: R foot    History: Per notes from referring physician, weakness noted in the left lower extremity with decreased vibration sensation and referred for emg/ncs. Patient states he has numbness in the left leg not noticed in the right leg. He has weakness in left > right leg and unsteady gait. He denies low back pain or radiculopathy. Not progressive, stable,  ongoing since last November after radiation.   Summary: EMG/NCS was performed on the lower extremities. The right peroneal motor nerve showed reduced amplitude(1.43mV, N>2).  The left tibial motor nerve showed reduced amplitude (3.4 mV, normal greater than 4).  The right tibial motor nerve showed reduced amplitude (2.8 mV, normal greater than 4) and decreased conduction velocity (pop fossa to ankle, 38 m/s, normal greater than 41).  The left sural sensory nerve showed reduced amplitude (3 V, normal greater than 6).  The right sural sensory nerve showed no response.  The left superficial peroneal sensory nerve showed reduced amplitude (1 V, normal greater than 6).The right superficial peroneal sensory nerve showed reduced amplitude (1 V, normal greater than 6).All remaining nerves (as indicated in the following tables) were within normal limits.  All muscles (as indicated in the following tables) were within normal limits.    Conclusion: Patient has a predominantly sensory, axonal, length-dependent polyneuropathy however several of the motor nerves also show mild to moderately reduced amplitude on conductions but not as significant as seen on the sensory nerve conductions.  There were no acute/ongoing denervation or chronic neurogenic changes seen in the proximal and distal muscles or in the  lumbar paraspinals; no electrophysiologic evidence for radiculopathy,  myopathy/myositis or other muscle disease. Recommend MRI of the lumbar spine as clinically warranted.   Sarina Ill M.D.  [facility] [place], [place] [address] Tel: [phone] Fax: [phone]  Verbal informed consent was obtained from the patient, patient was informed of potential risk of procedure, including bruising, bleeding, hematoma formation, infection, muscle weakness, muscle pain, numbness, among others.         [place]    Nerve / Sites Muscle Latency Ref. Amplitude Ref. Rel Amp Segments Distance Velocity Ref. Area    ms ms mV mV %  cm m/s m/s mVms  L Peroneal - EDB     Ankle EDB 4.4 ?6.5 2.3 ?2.0 100 Ankle - EDB 9   9.8     Fib head EDB 11.9  2.4  108 Fib head - Ankle 33 44 ?44 10.3     Pop fossa EDB 14.1  2.4  101 Pop fossa - Fib head 10 44 ?44 10.2         Pop fossa - Ankle      R Peroneal - EDB     Ankle EDB 4.8 ?6.5 1.8 ?2.0 100 Ankle - EDB 9   7.3     Fib head EDB 12.4  1.4  77.4 Fib head - Ankle 33 44 ?44 5.7     Pop fossa EDB 14.6  1.1  75.2 Pop fossa - Fib head 10 44 ?44 4.2         Pop fossa - Ankle      L Tibial - AH  Ankle AH 4.4 ?5.8 3.4 ?4.0 100 Ankle - AH 9   6.0     Pop fossa AH 14.9  2.8  83.3 Pop fossa - Ankle 44 42 ?41 4.4  R Tibial - AH     Ankle AH 4.3 ?5.8 2.8 ?4.0 100 Ankle - AH 9   8.8     Pop fossa AH 16.0  1.0  37.6 Pop fossa - Ankle 45 38 ?41 5.4             SNC    Nerve / Sites Rec. Site Peak Lat Ref.  Amp Ref. Segments Distance    ms ms V V  cm  L Sural - Ankle (Calf)     Calf Ankle 4.4 ?4.4 3 ?6 Calf - Ankle 14  R Sural - Ankle (Calf)     Calf Ankle NR ?4.4 NR ?6 Calf - Ankle 14  L Superficial peroneal - Ankle     Lat leg Ankle 4.0 ?4.4 1 ?6 Lat leg - Ankle 14  R Superficial peroneal - Ankle     Lat leg Ankle 5.1 ?4.4 1 ?6 Lat leg - Ankle 14  L Ulnar - Orthodromic, (Dig V, Mid palm)     Dig V Wrist 2.5 ?3.1 6 ?5 Dig V - Wrist  34               F  Wave    Nerve F Lat Ref.   ms ms  L Tibial - AH 58.2 ?56.0  R Tibial - AH 66.3 ?56.0         EMG Summary Table    Spontaneous MUAP Recruitment  Muscle IA Fib PSW Fasc Other Amp Dur. Poly Pattern  L. Lumbar paraspinals (low) Normal None None None _______ Normal Normal Normal Normal  L. Lumbar paraspinals (mid) Normal None None None _______ Normal Normal Normal Normal  L. Lumbar paraspinals (up) Normal None None None _______ Normal Normal Normal Normal  L. Vastus medialis Normal None None None _______ Normal Normal Normal Normal  L. Tibialis anterior Normal None None None _______ Normal Normal Normal Normal  L. Gastrocnemius (Medial head) Normal None None None _______ Normal Normal Normal Normal  L. Extensor hallucis longus Normal None None None _______ Normal Normal Normal Normal  L. Abductor hallucis Normal None None None _______ Normal Normal Normal Normal  L. Biceps femoris (long head) Normal None None None _______ Normal Normal Normal Normal  L. Gluteus maximus Normal None None None _______ Normal Normal Normal Normal  L. Gluteus medius Normal None None None _______ Normal Normal Normal Normal  L. Iliopsoas Normal None None None _______ Normal Normal Normal Normal  L. Adductor longus Normal None None None _______ Normal Normal Normal Normal

## 2019-12-21 NOTE — Progress Notes (Signed)
Full Name: Johnathan Sosa Gender: Male MRN #: [id number] Date of Birth: Jan 02, 1946    Visit Date: 12/21/2019 10:03 Age: 74 Years Examining Physician: Sarina Ill, MD  Referring Physician: [name], NP Height: 6 feet 1 inch   35.0 C: L foot 36.2 C: R foot    History: Per notes from referring physician, weakness noted in the left lower extremity with decreased vibration sensation and referred for emg/ncs. Patient states he has numbness in the left leg not noticed in the right leg. He has weakness in left > right leg and unsteady gait. He denies low back pain or radiculopathy. Not progressive, stable,  ongoing since last November after radiation.   Summary: EMG/NCS was performed on the lower extremities. The right peroneal motor nerve showed reduced amplitude(1.56mV, N>2).  The left tibial motor nerve showed reduced amplitude (3.4 mV, normal greater than 4).  The right tibial motor nerve showed reduced amplitude (2.8 mV, normal greater than 4) and decreased conduction velocity (pop fossa to ankle, 38 m/s, normal greater than 41).  The left sural sensory nerve showed reduced amplitude (3 V, normal greater than 6).  The right sural sensory nerve showed no response.  The left superficial peroneal sensory nerve showed reduced amplitude (1 V, normal greater than 6).The right superficial peroneal sensory nerve showed reduced amplitude (1 V, normal greater than 6).All remaining nerves (as indicated in the following tables) were within normal limits.  All muscles (as indicated in the following tables) were within normal limits.    Conclusion: Patient has a predominantly sensory, axonal, length-dependent polyneuropathy however several of the motor nerves also show mild to moderately reduced amplitude on conductions but not as significant as seen on the sensory nerve conductions.  There were no acute/ongoing denervation or chronic neurogenic changes seen in the proximal and distal muscles or in the  lumbar paraspinals; no electrophysiologic evidence for radiculopathy,  myopathy/myositis or other muscle disease. Recommend MRI of the lumbar spine as clinically warranted.   Sarina Ill M.D.  [facility] [place], [place] [address] Tel: [phone] Fax: [phone]  Verbal informed consent was obtained from the patient, patient was informed of potential risk of procedure, including bruising, bleeding, hematoma formation, infection, muscle weakness, muscle pain, numbness, among others.         [place]    Nerve / Sites Muscle Latency Ref. Amplitude Ref. Rel Amp Segments Distance Velocity Ref. Area    ms ms mV mV %  cm m/s m/s mVms  L Peroneal - EDB     Ankle EDB 4.4 ?6.5 2.3 ?2.0 100 Ankle - EDB 9   9.8     Fib head EDB 11.9  2.4  108 Fib head - Ankle 33 44 ?44 10.3     Pop fossa EDB 14.1  2.4  101 Pop fossa - Fib head 10 44 ?44 10.2         Pop fossa - Ankle      R Peroneal - EDB     Ankle EDB 4.8 ?6.5 1.8 ?2.0 100 Ankle - EDB 9   7.3     Fib head EDB 12.4  1.4  77.4 Fib head - Ankle 33 44 ?44 5.7     Pop fossa EDB 14.6  1.1  75.2 Pop fossa - Fib head 10 44 ?44 4.2         Pop fossa - Ankle      L Tibial - AH  Ankle AH 4.4 ?5.8 3.4 ?4.0 100 Ankle - AH 9   6.0     Pop fossa AH 14.9  2.8  83.3 Pop fossa - Ankle 44 42 ?41 4.4  R Tibial - AH     Ankle AH 4.3 ?5.8 2.8 ?4.0 100 Ankle - AH 9   8.8     Pop fossa AH 16.0  1.0  37.6 Pop fossa - Ankle 45 38 ?41 5.4             SNC    Nerve / Sites Rec. Site Peak Lat Ref.  Amp Ref. Segments Distance    ms ms V V  cm  L Sural - Ankle (Calf)     Calf Ankle 4.4 ?4.4 3 ?6 Calf - Ankle 14  R Sural - Ankle (Calf)     Calf Ankle NR ?4.4 NR ?6 Calf - Ankle 14  L Superficial peroneal - Ankle     Lat leg Ankle 4.0 ?4.4 1 ?6 Lat leg - Ankle 14  R Superficial peroneal - Ankle     Lat leg Ankle 5.1 ?4.4 1 ?6 Lat leg - Ankle 14  L Ulnar - Orthodromic, (Dig V, Mid palm)     Dig V Wrist 2.5 ?3.1 6 ?5 Dig V - Wrist  36               F  Wave    Nerve F Lat Ref.   ms ms  L Tibial - AH 58.2 ?56.0  R Tibial - AH 66.3 ?56.0         EMG Summary Table    Spontaneous MUAP Recruitment  Muscle IA Fib PSW Fasc Other Amp Dur. Poly Pattern  L. Lumbar paraspinals (low) Normal None None None _______ Normal Normal Normal Normal  L. Lumbar paraspinals (mid) Normal None None None _______ Normal Normal Normal Normal  L. Lumbar paraspinals (up) Normal None None None _______ Normal Normal Normal Normal  L. Vastus medialis Normal None None None _______ Normal Normal Normal Normal  L. Tibialis anterior Normal None None None _______ Normal Normal Normal Normal  L. Gastrocnemius (Medial head) Normal None None None _______ Normal Normal Normal Normal  L. Extensor hallucis longus Normal None None None _______ Normal Normal Normal Normal  L. Abductor hallucis Normal None None None _______ Normal Normal Normal Normal  L. Biceps femoris (long head) Normal None None None _______ Normal Normal Normal Normal  L. Gluteus maximus Normal None None None _______ Normal Normal Normal Normal  L. Gluteus medius Normal None None None _______ Normal Normal Normal Normal  L. Iliopsoas Normal None None None _______ Normal Normal Normal Normal  L. Adductor longus Normal None None None _______ Normal Normal Normal Normal

## 2019-12-26 ENCOUNTER — Telehealth (INDEPENDENT_AMBULATORY_CARE_PROVIDER_SITE_OTHER): Payer: Self-pay | Admitting: Adult Health

## 2019-12-26 DIAGNOSIS — Z8673 Personal history of transient ischemic attack (TIA), and cerebral infarction without residual deficits: Secondary | ICD-10-CM

## 2019-12-26 DIAGNOSIS — R29898 Other symptoms and signs involving the musculoskeletal system: Secondary | ICD-10-CM

## 2019-12-26 DIAGNOSIS — R296 Repeated falls: Secondary | ICD-10-CM

## 2019-12-26 NOTE — Telephone Encounter (Signed)
I called the patient and left a voicemail for him to call the office regarding nerve conduction EMG results

## 2019-12-27 NOTE — Telephone Encounter (Signed)
I called the patient.  A I reviewed his nerve conduction studies with him.  Advised that I had discussed with Dr. Lavell Anchors who completed the test.  We recommend MRI of the brain.  He voiced understanding.

## 2019-12-27 NOTE — Addendum Note (Signed)
Addended by: Trudie Buckler on: 12/27/2019 04:50 PM   Modules accepted: Orders, Level of Service

## 2020-01-02 ENCOUNTER — Telehealth: Payer: Self-pay | Admitting: Adult Health

## 2020-01-02 NOTE — Telephone Encounter (Signed)
self pay order sent to GI. They will reach out to the patient to schedule.  °

## 2020-01-15 ENCOUNTER — Other Ambulatory Visit: Payer: Self-pay | Admitting: Family Medicine

## 2020-01-15 NOTE — Telephone Encounter (Signed)
Requesting refill on Clonazepam. Appt scheduled for 01/22/2020.

## 2020-01-18 NOTE — Patient Instructions (Addendum)
We will call you within two weeks about your referral to physical therapy. If you do not hear within 3 weeks, give [place] a call.   Please call [phone] to schedule a visit with [facility] -Trey Paula is an excellent counselor who is based out of our clinic  Please check with your pharmacy to see if they have the shingrix vaccine and make sure you havent had previous doses of shingrix. If they do- please get this immunization and update [place] by phone call or mychart with dates you receive the vaccine  Use cane consistently. If you have anymore falls you agreed to let me know- we will need to reduce the clonazepam.   Take a mens multivitamin such as centrum silver or comparable  Schedule a lab visit at the check out desk within 2 weeks. Return for future fasting labs meaning nothing but water after midnight please. Ok to take your medications with water.
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
## 2020-01-22 ENCOUNTER — Other Ambulatory Visit: Payer: Self-pay

## 2020-01-22 ENCOUNTER — Ambulatory Visit (INDEPENDENT_AMBULATORY_CARE_PROVIDER_SITE_OTHER): Payer: Medicare HMO | Admitting: Family Medicine

## 2020-01-22 ENCOUNTER — Encounter: Payer: Self-pay | Admitting: Family Medicine

## 2020-01-22 VITALS — BP 120/78 | HR 81 | Temp 98.7°F | Ht 73.0 in | Wt 205.0 lb

## 2020-01-22 DIAGNOSIS — Z87891 Personal history of nicotine dependence: Secondary | ICD-10-CM

## 2020-01-22 DIAGNOSIS — I700 Atherosclerosis of aorta: Secondary | ICD-10-CM

## 2020-01-22 DIAGNOSIS — Z0000 Encounter for general adult medical examination without abnormal findings: Secondary | ICD-10-CM

## 2020-01-22 DIAGNOSIS — R42 Dizziness and giddiness: Secondary | ICD-10-CM

## 2020-01-22 DIAGNOSIS — R5383 Other fatigue: Secondary | ICD-10-CM

## 2020-01-22 DIAGNOSIS — S069XAS Unspecified intracranial injury with loss of consciousness status unknown, sequela: Secondary | ICD-10-CM

## 2020-01-22 DIAGNOSIS — I10 Essential (primary) hypertension: Secondary | ICD-10-CM

## 2020-01-22 DIAGNOSIS — E785 Hyperlipidemia, unspecified: Secondary | ICD-10-CM

## 2020-01-22 DIAGNOSIS — M10079 Idiopathic gout, unspecified ankle and foot: Secondary | ICD-10-CM | POA: Diagnosis not present

## 2020-01-22 DIAGNOSIS — F324 Major depressive disorder, single episode, in partial remission: Secondary | ICD-10-CM

## 2020-01-22 DIAGNOSIS — R69 Illness, unspecified: Secondary | ICD-10-CM | POA: Diagnosis not present

## 2020-01-22 DIAGNOSIS — R269 Unspecified abnormalities of gait and mobility: Secondary | ICD-10-CM

## 2020-01-22 DIAGNOSIS — S069X9D Unspecified intracranial injury with loss of consciousness of unspecified duration, subsequent encounter: Secondary | ICD-10-CM

## 2020-01-22 DIAGNOSIS — C61 Malignant neoplasm of prostate: Secondary | ICD-10-CM

## 2020-01-24 ENCOUNTER — Other Ambulatory Visit (INDEPENDENT_AMBULATORY_CARE_PROVIDER_SITE_OTHER): Payer: Medicare HMO

## 2020-01-24 ENCOUNTER — Other Ambulatory Visit: Payer: Self-pay

## 2020-01-24 DIAGNOSIS — R5383 Other fatigue: Secondary | ICD-10-CM

## 2020-01-24 DIAGNOSIS — E785 Hyperlipidemia, unspecified: Secondary | ICD-10-CM | POA: Diagnosis not present

## 2020-01-24 DIAGNOSIS — M10079 Idiopathic gout, unspecified ankle and foot: Secondary | ICD-10-CM

## 2020-01-24 DIAGNOSIS — E559 Vitamin D deficiency, unspecified: Secondary | ICD-10-CM

## 2020-01-24 DIAGNOSIS — Z0000 Encounter for general adult medical examination without abnormal findings: Secondary | ICD-10-CM

## 2020-01-24 DIAGNOSIS — I10 Essential (primary) hypertension: Secondary | ICD-10-CM

## 2020-01-24 LAB — COMPREHENSIVE METABOLIC PANEL
ALT: 28 U/L (ref 0–53)
AST: 18 U/L (ref 0–37)
Albumin: 4.2 g/dL (ref 3.5–5.2)
Alkaline Phosphatase: 51 U/L (ref 39–117)
BUN: 22 mg/dL (ref 6–23)
CO2: 24 mEq/L (ref 19–32)
Calcium: 9.5 mg/dL (ref 8.4–10.5)
Chloride: 107 mEq/L (ref 96–112)
Creatinine, Ser: 1.15 mg/dL (ref 0.40–1.50)
GFR: 62.56 mL/min (ref >60.00)
Glucose, Bld: 104 mg/dL — ABNORMAL HIGH (ref 70–99)
Potassium: 3.6 mEq/L (ref 3.5–5.1)
Sodium: 141 mEq/L (ref 135–145)
Total Bilirubin: 0.7 mg/dL (ref 0.2–1.2)
Total Protein: 6.1 g/dL (ref 6.0–8.3)

## 2020-01-24 LAB — CBC WITH DIFFERENTIAL/PLATELET
Basophils Absolute: 0 10*3/uL (ref 0.0–0.1)
Basophils Relative: 0.7 % (ref 0.0–3.0)
Eosinophils Absolute: 0.4 10*3/uL (ref 0.0–0.7)
Eosinophils Relative: 6 % — ABNORMAL HIGH (ref 0.0–5.0)
HCT: 39.4 % (ref 39.0–52.0)
Hemoglobin: 13.6 g/dL (ref 13.0–17.0)
Lymphocytes Relative: 22.8 % (ref 12.0–46.0)
Lymphs Abs: 1.4 10*3/uL (ref 0.7–4.0)
MCHC: 34.6 g/dL (ref 30.0–36.0)
MCV: 90.6 fl (ref 78.0–100.0)
Monocytes Absolute: 0.5 10*3/uL (ref 0.1–1.0)
Monocytes Relative: 8.7 % (ref 3.0–12.0)
Neutro Abs: 3.7 10*3/uL (ref 1.4–7.7)
Neutrophils Relative %: 61.8 % (ref 43.0–77.0)
Platelets: 214 10*3/uL (ref 150.0–400.0)
RBC: 4.35 Mil/uL (ref 4.22–5.81)
RDW: 12.8 % (ref 11.5–15.5)
WBC: 5.9 10*3/uL (ref 4.0–10.5)

## 2020-01-24 LAB — TSH: TSH: 2.2 u[IU]/mL (ref 0.35–4.50)

## 2020-01-24 LAB — VITAMIN D 25 HYDROXY (VIT D DEFICIENCY, FRACTURES): VITD: 20.85 ng/mL — ABNORMAL LOW (ref 30.00–100.00)

## 2020-01-24 LAB — LIPID PANEL
Cholesterol: 191 mg/dL (ref 0–200)
HDL: 35.9 mg/dL — ABNORMAL LOW (ref >39.00)
NonHDL: 155.47
Total CHOL/HDL Ratio: 5
Triglycerides: 208 mg/dL — ABNORMAL HIGH (ref 0.0–149.0)
VLDL: 41.6 mg/dL — ABNORMAL HIGH (ref 0.0–40.0)

## 2020-01-24 LAB — VITAMIN B12: Vitamin B-12: 193 pg/mL — ABNORMAL LOW (ref 211–911)

## 2020-01-24 LAB — URIC ACID: Uric Acid, Serum: 4.2 mg/dL (ref 4.0–7.8)

## 2020-01-24 LAB — LDL CHOLESTEROL, DIRECT: Direct LDL: 110 mg/dL

## 2020-01-25 ENCOUNTER — Other Ambulatory Visit: Payer: Self-pay | Admitting: Family Medicine

## 2020-01-25 DIAGNOSIS — E538 Deficiency of other specified B group vitamins: Secondary | ICD-10-CM | POA: Insufficient documentation

## 2020-01-25 DIAGNOSIS — E559 Vitamin D deficiency, unspecified: Secondary | ICD-10-CM

## 2020-01-25 MED ORDER — VITAMIN D (ERGOCALCIFEROL) 1.25 MG (50000 UNIT) PO CAPS: 50000.0000 [IU] | ORAL_CAPSULE | ORAL | 0 refills | Status: DC

## 2020-01-26 ENCOUNTER — Encounter: Payer: Self-pay | Admitting: Family Medicine

## 2020-01-26 ENCOUNTER — Ambulatory Visit (INDEPENDENT_AMBULATORY_CARE_PROVIDER_SITE_OTHER): Payer: Medicare HMO | Admitting: Family Medicine

## 2020-01-26 ENCOUNTER — Other Ambulatory Visit: Payer: Self-pay

## 2020-01-26 VITALS — BP 120/78 | HR 90 | Temp 98.7°F | Ht 73.0 in | Wt 203.0 lb

## 2020-01-26 DIAGNOSIS — W57XXXA Bitten or stung by nonvenomous insect and other nonvenomous arthropods, initial encounter: Secondary | ICD-10-CM | POA: Diagnosis not present

## 2020-01-26 DIAGNOSIS — E559 Vitamin D deficiency, unspecified: Secondary | ICD-10-CM

## 2020-01-26 DIAGNOSIS — E538 Deficiency of other specified B group vitamins: Secondary | ICD-10-CM | POA: Diagnosis not present

## 2020-01-26 DIAGNOSIS — E785 Hyperlipidemia, unspecified: Secondary | ICD-10-CM

## 2020-01-26 DIAGNOSIS — S70361A Insect bite (nonvenomous), right thigh, initial encounter: Secondary | ICD-10-CM | POA: Diagnosis not present

## 2020-01-26 MED ORDER — ROSUVASTATIN CALCIUM 10 MG PO TABS
10.0000 mg | ORAL_TABLET | Freq: Every day | ORAL | 3 refills | Status: DC
Start: 2020-01-26 — End: 2020-12-03

## 2020-01-26 MED ORDER — CYANOCOBALAMIN 1000 MCG/ML IJ SOLN
1000.0000 ug | Freq: Once | INTRAMUSCULAR | Status: AC
  Administered 2020-01-26: 1000 ug via INTRAMUSCULAR

## 2020-01-26 NOTE — Addendum Note (Signed)
Addended by: Clyde Lundborg A on: 01/26/2020 10:13 AM   Modules accepted: Orders

## 2020-01-26 NOTE — Progress Notes (Signed)
Phone [phone] In person visit   Subjective:   Johnathan Sosa is a 74 y.o. year old very pleasant male patient who presents for/with See problem oriented charting Chief Complaint  Patient presents with  . Insect Bite    x 3 in the last 2 days     This visit occurred during the SARS-CoV-2 public health emergency.  Safety protocols were in place, including screening questions prior to the visit, additional usage of staff PPE, and extensive cleaning of exam room while observing appropriate contact time as indicated for disinfecting solutions.   Past Medical History-  Patient Active Problem List   Diagnosis Date Noted  . Unintentional weight loss 12/21/2017    Priority: High  . Prostate cancer ([place]) 07/13/2017    Priority: High  . History of CVA (cerebrovascular accident) 07/20/2016    Priority: High  . Closed TBI (traumatic brain injury) ([place]) 10/24/2014    Priority: High  . 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 Memory loss     Priority: High  . Vitamin B12 deficiency 01/25/2020    Priority: Medium  . Vitamin D deficiency 01/25/2020    Priority: Medium  . History of adenomatous polyp of colon 01/25/2019    Priority: Medium  . Aortic atherosclerosis ([place]) 12/21/2017    Priority: Medium  . Attention deficit hyperactivity disorder (ADHD) 07/06/2016    Priority: Medium  . Gout     Priority: Medium  . Insomnia     Priority: Medium  . Hyperlipidemia     Priority: Medium  . Depression, major, single episode, in partial remission ([place])     Priority: Medium  . HTN (hypertension) 04/29/2012    Priority: Medium  . Former smoker 10/01/2015    Priority: Low  . Erectile dysfunction 10/01/2015    Priority: Low  . Migraine headache     Priority: Low  . Abnormal eye movements 10/24/2014    Priority: Low  . 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 GERD (gastroesophageal reflux disease)     Priority: Low  .  Recurrent falls while walking 02/06/2019  . MCI (mild cognitive impairment) with memory loss 02/06/2019  . Hypersomnia with sleep apnea 02/06/2019  . Excessive daytime sleepiness 06/07/2018  . MCI (mild cognitive impairment) 06/07/2018  . Fatigue due to depression 06/07/2018  . Ileus ([place]) 09/04/2017  . 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 BPPV (benign paroxysmal positional vertigo) 12/21/2016  . Screening for prostate cancer 10/16/2016  . Palpitations 07/20/2016    Medications- reviewed and updated Current Outpatient Medications  Medication Sig Dispense Refill  . allopurinol (ZYLOPRIM) 300 MG tablet TAKE 1 TABLET EVERY DAY 90 tablet 3  . ALPRAZolam (XANAX) 0.25 MG tablet TAKE 1 TABLET BY MOUTH TWICE DAILY AS NEEDED FOR VERTIGO. DO NOT EXCEED 2 TABS DAILY 60 tablet 5  . amLODipine (NORVASC) 2.5 MG tablet TAKE 1 TABLET EVERY DAY (Patient not taking: Reported on 01/22/2020) 90 tablet 3  . atorvastatin (LIPITOR) 10 MG tablet TAKE 1 TABLET EVERY DAY 90 tablet 3  . buPROPion (WELLBUTRIN XL) 300 MG 24 hr tablet TAKE 1 TABLET EVERY DAY 90 tablet 1  . citalopram (CELEXA) 10 MG tablet Take 1 tablet (10 mg total) by mouth daily. Per Dr. Brett Fairy per patient 90 tablet 3  . clonazePAM (KLONOPIN) 1 MG tablet TAKE 1 TABLET BY MOUTH EVERY DAY AT BEDTIME AS NEEDED 30 tablet 0  .  Vitamin D, Ergocalciferol, (DRISDOL) 1.25 MG (50000 UNIT) CAPS capsule Take 1 capsule (50,000 Units total) by mouth every 7 (seven) days. 12 capsule 0   No current facility-administered medications for this visit.     Objective:  BP 120/78   Pulse 90   Temp 98.7 F (37.1 C) (Temporal)   Ht 6\' 1"  (1.854 m)   Wt 203 lb (92.1 kg)   SpO2 97%   BMI 26.78 kg/m  Gen: NAD, resting comfortably CV: RRR no murmurs rubs or gallops Lungs: CTAB no crackles, wheeze, rhonchi Ext: no edema Skin: warm, dry, erythematous papule on left lower abdomen and the right elbow.  More intense erythema under right leg-surrounding  erythema about the size of a quarter- central area slightly red (improves with icing thankfully    Assessment and Plan   # concern for tick bite tick bite  S:Patient has found three ticks on him in the last three days. He has found one on his right inner thigh, right arm and lower abdomen. Denies any fever, chills headache nausea or vomiting. He has noticed areas are red and has small black area in center. He has used ice and alcohol on area but has not noticed improvement.  A/P: from avs " In regards to the possible tick bite-continue to monitor for flulike symptoms such as fever, chills, headache, nausea, vomiting.  Areas like this can also develop a skin infection and so he had expanding redness or worsening pain in the area please let us know and I will call in an antibiotic called doxycycline"   # B12 deficiency S: Current treatment/medication (oral vs. IM): none  Lab Results  Component Value Date   VITAMINB12 193 (L) 01/24/2020   A/P: from avs "Your fatigue could be caused by the low B12-first injection today and then Schedule weekly B-12 injections at check out for 3 weeks.  After the 4 weeks of injections-I want you to buy 1000 mcg of vitamin B12 to take daily over-the-counter to help keep your level up"   #Vitamin D deficiency S: Medication: none Last vitamin D Lab Results  Component Value Date   VD25OH 20.85 (L) 01/24/2020   A/P: from avs "Start high-dose vitamin D which I sent in for you for 12 weeks and then start 2000 units daily of OTC vitamin D when you finish the high-dose for 12 weeks.  I am hoping you see some improvement in your fatigue with the above 2 treatments. Hoping you are feeling better by august.  "  #hyperlipidemia/history of stroke S: Medication: Atorvastatin 10 mg-poor control as below Lab Results  Component Value Date   CHOL 191 01/24/2020   HDL 35.90 (L) 01/24/2020   LDLCALC 71 10/08/2017   LDLDIRECT 110.0 01/24/2020   TRIG 208.0 (H) 01/24/2020    CHOLHDL 5 01/24/2020   A/P: We are going to stop his atorvastatin 10 mg and advance to rosuvastatin 10 mg-at least want to get LDL under 100 ideally would get under 70 with his history of stroke based off MRI in 2017 - he is concerned about side effects of cholesterol medicine-I really stressed that I do want him to take this as long as he does not have significant side effects-if he has side effects I want him to let us know   Recommended follow up:   Keep august follow up Future Appointments  Date Time Provider [place]  02/01/2020 12:20 PM GI-315 MR 3 GI-315MRI GI-315 W. [facility]  02/13/2020  9:00 AM [facility]-CV  NL VASC 4 MC-SECVI [facility]  04/25/2020  2:40 PM Marin Olp, MD [facility] P[facility]  06/25/2020 11:00 AM [name], NP [facility] None   Lab/Order associations:   ICD-10-CM   1. Vitamin B12 deficiency  E53.8   2. Vitamin D deficiency  E55.9   3. Hyperlipidemia, unspecified hyperlipidemia type  E78.5   4. Insect bite of right thigh, initial encounter  S70.361A    W57.XXXA    Meds ordered this encounter  Medications  . rosuvastatin (CRESTOR) 10 MG tablet    Sig: Take 1 tablet (10 mg total) by mouth daily.    Dispense:  90 tablet    Refill:  3   Return precautions advised.  [name], MD

## 2020-01-26 NOTE — Patient Instructions (Addendum)
Your fatigue could be caused by the low B12-first injection today and then Schedule weekly B-12 injections at check out for 3 weeks.  After the 4 weeks of injections-I want you to buy 1000 mcg of vitamin B12 to take daily over-the-counter to help keep your level up  Start high-dose vitamin D which I sent in for you for 12 weeks and then start 2000 units daily of OTC vitamin D when you finish the high-dose for 12 weeks.  I am hoping you see some improvement in your fatigue with the above 2 treatments. Hoping you are feeling better by august.   In regards to the possible tick bite-continue to monitor for flulike symptoms such as fever, chills, headache, nausea, vomiting.  Areas like this can also develop a skin infection and so he had expanding redness or worsening pain in the area please let us know and I will call in an antibiotic called doxycycline  We are going to stop his atorvastatin 10 mg and advance to rosuvastatin 10 mg-at least want to get LDL under 100 ideally would get under 70 with his history of stroke based off MRI in 2017 - he is concerned about side effects of cholesterol medicine-I really stressed that I do want him to take this as long as he does not have significant side effects-if he has side effects I want him to let us know  Recommended follow up: Return for as needed for new, worsening, persistent symptoms.

## 2020-01-31 NOTE — Telephone Encounter (Signed)
Johnathan Sosa Josem KaufmannTW:[id number] (exp. 01/30/20 to 07/28/20) patient was scheduled at GI for 02/01/20 but the patient canceled and did not want to r/s.

## 2020-02-01 ENCOUNTER — Other Ambulatory Visit: Payer: Medicare HMO

## 2020-02-01 ENCOUNTER — Other Ambulatory Visit: Payer: Self-pay

## 2020-02-01 ENCOUNTER — Ambulatory Visit (INDEPENDENT_AMBULATORY_CARE_PROVIDER_SITE_OTHER): Payer: Medicare HMO

## 2020-02-01 DIAGNOSIS — E538 Deficiency of other specified B group vitamins: Secondary | ICD-10-CM | POA: Diagnosis not present

## 2020-02-01 MED ORDER — CYANOCOBALAMIN 1000 MCG/ML IJ SOLN
1000.0000 ug | Freq: Once | INTRAMUSCULAR | Status: AC
Start: 2020-02-01 — End: 2020-02-01
  Administered 2020-02-01: 1000 ug via INTRAMUSCULAR

## 2020-02-05 ENCOUNTER — Other Ambulatory Visit: Payer: Self-pay

## 2020-02-05 ENCOUNTER — Ambulatory Visit (INDEPENDENT_AMBULATORY_CARE_PROVIDER_SITE_OTHER): Payer: Medicare HMO | Admitting: Physical Therapy

## 2020-02-05 ENCOUNTER — Encounter: Payer: Self-pay | Admitting: Physical Therapy

## 2020-02-05 DIAGNOSIS — R2689 Other abnormalities of gait and mobility: Secondary | ICD-10-CM

## 2020-02-07 ENCOUNTER — Ambulatory Visit (INDEPENDENT_AMBULATORY_CARE_PROVIDER_SITE_OTHER): Payer: Medicare HMO | Admitting: Physical Therapy

## 2020-02-07 ENCOUNTER — Ambulatory Visit (INDEPENDENT_AMBULATORY_CARE_PROVIDER_SITE_OTHER): Payer: Medicare HMO

## 2020-02-07 ENCOUNTER — Other Ambulatory Visit: Payer: Self-pay

## 2020-02-07 DIAGNOSIS — E538 Deficiency of other specified B group vitamins: Secondary | ICD-10-CM

## 2020-02-07 DIAGNOSIS — R2689 Other abnormalities of gait and mobility: Secondary | ICD-10-CM

## 2020-02-07 MED ORDER — CYANOCOBALAMIN 1000 MCG/ML IJ SOLN
1000.0000 ug | Freq: Once | INTRAMUSCULAR | Status: AC
  Administered 2020-02-07: 1000 ug via INTRAMUSCULAR

## 2020-02-07 NOTE — Progress Notes (Signed)
Per orders of [name], [place], injection of b-12given by Francella Solian in right deltoid. Patient tolerated injection well. Patient will make appointment for 1 week.

## 2020-02-08 ENCOUNTER — Ambulatory Visit: Payer: Medicare HMO

## 2020-02-12 ENCOUNTER — Encounter: Payer: Self-pay | Admitting: Physical Therapy

## 2020-02-12 ENCOUNTER — Encounter: Payer: Medicare HMO | Admitting: Physical Therapy
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
## 2020-02-13 ENCOUNTER — Ambulatory Visit (HOSPITAL_COMMUNITY)
Admission: RE | Admit: 2020-02-13 | Discharge: 2020-02-13 | Disposition: A | Discharge status: Home / Self Care | Payer: Medicare HMO | Source: Ambulatory Visit | Attending: Cardiology | Admitting: Cardiology

## 2020-02-13 ENCOUNTER — Ambulatory Visit: Payer: Medicare HMO | Admitting: Neurology

## 2020-02-13 ENCOUNTER — Other Ambulatory Visit: Payer: Self-pay

## 2020-02-13 DIAGNOSIS — I700 Atherosclerosis of aorta: Secondary | ICD-10-CM | POA: Insufficient documentation

## 2020-02-13 DIAGNOSIS — Z136 Encounter for screening for cardiovascular disorders: Secondary | ICD-10-CM | POA: Diagnosis not present

## 2020-02-13 DIAGNOSIS — Z87891 Personal history of nicotine dependence: Secondary | ICD-10-CM

## 2020-02-14 DIAGNOSIS — R69 Illness, unspecified: Secondary | ICD-10-CM | POA: Diagnosis not present

## 2020-02-15 ENCOUNTER — Ambulatory Visit (INDEPENDENT_AMBULATORY_CARE_PROVIDER_SITE_OTHER): Payer: Medicare HMO

## 2020-02-15 ENCOUNTER — Other Ambulatory Visit: Payer: Self-pay

## 2020-02-15 ENCOUNTER — Ambulatory Visit (INDEPENDENT_AMBULATORY_CARE_PROVIDER_SITE_OTHER): Payer: Medicare HMO | Admitting: Physical Therapy

## 2020-02-15 DIAGNOSIS — E538 Deficiency of other specified B group vitamins: Secondary | ICD-10-CM

## 2020-02-15 DIAGNOSIS — R2689 Other abnormalities of gait and mobility: Secondary | ICD-10-CM

## 2020-02-15 MED ORDER — CYANOCOBALAMIN 1000 MCG/ML IJ SOLN
1000.0000 ug | Freq: Once | INTRAMUSCULAR | Status: AC
  Administered 2020-02-15: 1000 ug via INTRAMUSCULAR

## 2020-02-15 NOTE — Progress Notes (Signed)
Per orders of Dr. [name], injection of B-12 given by Francella Solian in left deltoid. Patient tolerated injection well. This was last injection of 4. He will start taking oral B-12.

## 2020-02-16 ENCOUNTER — Telehealth: Payer: Self-pay | Admitting: Family Medicine

## 2020-02-16 NOTE — Telephone Encounter (Signed)
  LAST APPOINTMENT DATE: 01/26/2020   NEXT APPOINTMENT DATE:@6 /22/2021  MEDICATION:ALPRAZolam (XANAX) 0.25 MG tablet  PHARMACY:[id number] - [place], [place] - [address]  COMMENT: Patient is calling in asking if Dr.Hunter could refill this for him, states neurologist has not responded to the refill requests that the pharmacy has sent. Asked if someone could call his insurance to refill it to get the generic 90 day supply [phone]

## 2020-02-16 NOTE — Telephone Encounter (Signed)
Please advise 

## 2020-02-17 ENCOUNTER — Encounter: Payer: Self-pay | Admitting: Physical Therapy

## 2020-02-17 NOTE — Telephone Encounter (Signed)
Prescribed in march with 6 months supply- not clear why he would be out this early- I would have him schedule a visit with neurology to discuss with me out of office next week
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
## 2020-02-18 ENCOUNTER — Other Ambulatory Visit: Payer: Self-pay | Admitting: Family Medicine

## 2020-02-19 NOTE — Telephone Encounter (Signed)
Called pharmacy they do have refill and getting ready for patient now. I have called to let him know.

## 2020-02-20 ENCOUNTER — Encounter: Payer: Medicare HMO | Admitting: Physical Therapy

## 2020-02-20 NOTE — Telephone Encounter (Signed)
Pt requesting Clonazepam 1mg  tab LOV: 01/06/2020 Next Visit: 04/25/2020 Last refill: 01/15/2020  Approve?

## 2020-02-20 NOTE — Telephone Encounter (Signed)
Please advise 

## 2020-02-21 ENCOUNTER — Telehealth: Payer: Self-pay | Admitting: Family Medicine

## 2020-02-21 ENCOUNTER — Ambulatory Visit: Payer: Self-pay | Admitting: Adult Health

## 2020-02-21 NOTE — Telephone Encounter (Signed)
Pt called - his pharmacy called and informed him that his clonazepam needs authorization.  He is completely out.

## 2020-02-22 ENCOUNTER — Other Ambulatory Visit: Payer: Self-pay

## 2020-02-22 ENCOUNTER — Ambulatory Visit (INDEPENDENT_AMBULATORY_CARE_PROVIDER_SITE_OTHER): Payer: Medicare HMO | Admitting: Physical Therapy

## 2020-02-22 ENCOUNTER — Encounter: Payer: Self-pay | Admitting: Physical Therapy

## 2020-02-22 DIAGNOSIS — R2689 Other abnormalities of gait and mobility: Secondary | ICD-10-CM

## 2020-02-22 NOTE — Telephone Encounter (Signed)
Patient calling back to check on prescription.

## 2020-02-22 NOTE — Telephone Encounter (Signed)
Spoke to patent while he was in office to let him know has been sent in.

## 2020-02-25 ENCOUNTER — Encounter: Payer: Self-pay | Admitting: Physical Therapy
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
## 2020-02-29 ENCOUNTER — Other Ambulatory Visit: Payer: Self-pay

## 2020-02-29 ENCOUNTER — Ambulatory Visit (INDEPENDENT_AMBULATORY_CARE_PROVIDER_SITE_OTHER): Payer: Medicare HMO | Admitting: Physical Therapy

## 2020-02-29 DIAGNOSIS — R2689 Other abnormalities of gait and mobility: Secondary | ICD-10-CM | POA: Diagnosis not present

## 2020-02-29 NOTE — Patient Instructions (Signed)
Access Code: [email or web address] URL: [email or web address] Date: 02/29/2020 Prepared by: Lyndee Hensen  Exercises Standing March with Counter Support - 1 x daily - 2 sets - 10 reps Standing Hip Abduction with Unilateral Counter Support - 1 x daily - 2 sets - 10 reps Heel rises with counter support - 1 x daily - 2 sets - 10 reps Seated Knee Extension AROM - 1 x daily - 2 sets - 10 reps Sit to Stand - 1 x daily - 1 sets - 10 reps

## 2020-03-04 ENCOUNTER — Encounter: Payer: Self-pay | Admitting: Physical Therapy
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
## 2020-03-05 ENCOUNTER — Encounter: Payer: Medicare HMO | Admitting: Physical Therapy

## 2020-03-07 ENCOUNTER — Ambulatory Visit (INDEPENDENT_AMBULATORY_CARE_PROVIDER_SITE_OTHER): Payer: Medicare HMO | Admitting: Physical Therapy

## 2020-03-07 ENCOUNTER — Other Ambulatory Visit: Payer: Self-pay

## 2020-03-07 ENCOUNTER — Encounter: Payer: Self-pay | Admitting: Physical Therapy

## 2020-03-07 DIAGNOSIS — R2689 Other abnormalities of gait and mobility: Secondary | ICD-10-CM
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
## 2020-03-07 NOTE — Patient Instructions (Signed)
Access Code: [email or web address] URL: [email or web address] Date: 03/07/2020 Prepared by: Lyndee Hensen  Exercises Standing March with Counter Support - 1 x daily - 2 sets - 10 reps Standing Hip Abduction with Unilateral Counter Support - 1 x daily - 2 sets - 10 reps Heel rises with counter support - 1 x daily - 2 sets - 10 reps Seated Knee Extension AROM - 1 x daily - 2 sets - 10 reps Sit to Stand - 1 x daily - 1 sets - 10 reps Side to Side Weight Shift with Unilateral Counter Support - 1 x daily - 2 sets - 10 reps Staggered Stance Forward Backward Weight Shift with Unilateral Counter Support - 1 x daily - 2 sets - 10 reps Standing Tandem Balance with Counter Support - 1 x daily - 2 sets - 10 reps

## 2020-03-18 ENCOUNTER — Ambulatory Visit
Admission: RE | Admit: 2020-03-18 | Discharge: 2020-03-18 | Disposition: A | Discharge status: Home / Self Care | Payer: Medicare HMO | Source: Ambulatory Visit | Attending: Adult Health | Admitting: Adult Health

## 2020-03-18 ENCOUNTER — Other Ambulatory Visit: Payer: Self-pay

## 2020-03-18 DIAGNOSIS — R296 Repeated falls: Secondary | ICD-10-CM

## 2020-03-18 DIAGNOSIS — R29898 Other symptoms and signs involving the musculoskeletal system: Secondary | ICD-10-CM

## 2020-03-18 DIAGNOSIS — Z8673 Personal history of transient ischemic attack (TIA), and cerebral infarction without residual deficits: Secondary | ICD-10-CM | POA: Diagnosis not present

## 2020-03-18 MED ORDER — GADOBENATE DIMEGLUMINE 529 MG/ML IV SOLN
19.0000 mL | Freq: Once | INTRAVENOUS | Status: AC | PRN
  Administered 2020-03-18: 19 mL via INTRAVENOUS

## 2020-03-20 ENCOUNTER — Telehealth: Payer: Self-pay | Admitting: *Deleted

## 2020-03-20 NOTE — Telephone Encounter (Signed)
Spoke with patient and informed him the MRI brain showed no significant change when compared with scan in 2017 with the exception of slightly progressed atrophy. NP will not change therapy at this time. Patient verbalized understanding, appreciation.

## 2020-03-28 ENCOUNTER — Telehealth: Payer: Self-pay

## 2020-03-28 NOTE — Telephone Encounter (Signed)
I searched under medications and did not see this has ever been prescribed.  I also did a chart search which is in epic feature that allows me to search through the entire chart And I did not see mention of Zantac or ranitidine which is the generic name.  I also looked under GERD and the only notes I have are as needed Tums.  He may have taken ranitidine/Zantac over-the-counter but I do not have any evidence of that from the chart to provide

## 2020-03-28 NOTE — Telephone Encounter (Signed)
Have looked do not see zantac in chart.

## 2020-03-28 NOTE — Telephone Encounter (Signed)
Pt called wanting to know when he took the medication Zantac. I searched through the chart, atleast where I know to look and could not find a mention of Zantac. Pt has hired an Forensic psychologist because there is some kind of lawsuit that if you took Zantac it is linked to cancer. Since the pt says he took Zantac and has cancer he has an attorney, whom wants some records of the Zantac being prescribed. I told pt that I did not see it, but that I would send a message back to Dr. Ansel Bong team. If you find something in regards to zantac in the chart , please let me know and I will contact patient.

## 2020-03-29 NOTE — Telephone Encounter (Signed)
Called patient gave information. Will call other office and see if he has any record of it with them.

## 2020-03-30 DIAGNOSIS — E785 Hyperlipidemia, unspecified: Secondary | ICD-10-CM | POA: Diagnosis not present

## 2020-03-30 DIAGNOSIS — R69 Illness, unspecified: Secondary | ICD-10-CM | POA: Diagnosis not present

## 2020-03-30 DIAGNOSIS — Z8249 Family history of ischemic heart disease and other diseases of the circulatory system: Secondary | ICD-10-CM | POA: Diagnosis not present

## 2020-03-30 DIAGNOSIS — R32 Unspecified urinary incontinence: Secondary | ICD-10-CM | POA: Diagnosis not present

## 2020-03-30 DIAGNOSIS — Z809 Family history of malignant neoplasm, unspecified: Secondary | ICD-10-CM | POA: Diagnosis not present

## 2020-03-30 DIAGNOSIS — M109 Gout, unspecified: Secondary | ICD-10-CM | POA: Diagnosis not present

## 2020-03-30 DIAGNOSIS — F419 Anxiety disorder, unspecified: Secondary | ICD-10-CM | POA: Diagnosis not present

## 2020-03-30 DIAGNOSIS — N529 Male erectile dysfunction, unspecified: Secondary | ICD-10-CM | POA: Diagnosis not present

## 2020-03-30 DIAGNOSIS — E559 Vitamin D deficiency, unspecified: Secondary | ICD-10-CM | POA: Diagnosis not present

## 2020-03-30 DIAGNOSIS — I10 Essential (primary) hypertension: Secondary | ICD-10-CM | POA: Diagnosis not present

## 2020-04-12 ENCOUNTER — Other Ambulatory Visit: Payer: Self-pay | Admitting: Family Medicine

## 2020-04-24 NOTE — Patient Instructions (Addendum)
Health Maintenance Due  Topic Date Due  . INFLUENZA VACCINE   - will complete later in flu season (please let us know if you get this at another location so we can update your chart) . We should have vaccination here in 1-2 months - can call back for an appointment.   03/31/2020   b12 hopefully controlled- encouraged to reduce to 1000 units of b12 per day  Team- Change vitamin b12 on his med list to 100 units  With depression still not being in best spot Please call [phone] to schedule a visit with [facility] -Trey Paula is an excellent counselor who is based out of our clinic  Refilled clonazepam 31 tablets with 5 refills- this should last full 6 months. I wrote in note can fill today. If there are issues have them give me a call  Team please update phq9 completely. meds and counseling as treatment

## 2020-04-24 NOTE — Progress Notes (Signed)
Phone [phone] In person visit   Subjective:   Johnathan Sosa is a 74 y.o. year old very pleasant male patient who presents for/with See problem oriented charting Chief Complaint  Patient presents with  . Follow-up   This visit occurred during the SARS-CoV-2 public health emergency.  Safety protocols were in place, including screening questions prior to the visit, additional usage of staff PPE, and extensive cleaning of exam room while observing appropriate contact time as indicated for disinfecting solutions.   Past Medical History-  Patient Active Problem List   Diagnosis Date Noted  . Unintentional weight loss 12/21/2017    Priority: High  . Prostate cancer ([place]) 07/13/2017    Priority: High  . History of CVA (cerebrovascular accident) 07/20/2016    Priority: High  . Closed TBI (traumatic brain injury) ([place]) 10/24/2014    Priority: High  . 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 Memory loss     Priority: High  . Vitamin B12 deficiency 01/25/2020    Priority: Medium  . Vitamin D deficiency 01/25/2020    Priority: Medium  . History of adenomatous polyp of colon 01/25/2019    Priority: Medium  . Aortic atherosclerosis ([place]) 12/21/2017    Priority: Medium  . Attention deficit hyperactivity disorder (ADHD) 07/06/2016    Priority: Medium  . Gout     Priority: Medium  . Insomnia     Priority: Medium  . Hyperlipidemia     Priority: Medium  . Depression, major, single episode, in partial remission ([place])     Priority: Medium  . HTN (hypertension) 04/29/2012    Priority: Medium  . Former smoker 10/01/2015    Priority: Low  . Erectile dysfunction 10/01/2015    Priority: Low  . Migraine headache     Priority: Low  . Abnormal eye movements 10/24/2014    Priority: Low  . 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 GERD (gastroesophageal reflux disease)     Priority: Low  . Recurrent falls while walking  02/06/2019  . MCI (mild cognitive impairment) with memory loss 02/06/2019  . Hypersomnia with sleep apnea 02/06/2019  . Excessive daytime sleepiness 06/07/2018  . MCI (mild cognitive impairment) 06/07/2018  . Fatigue due to depression 06/07/2018  . Ileus ([place]) 09/04/2017  . 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 BPPV (benign paroxysmal positional vertigo) 12/21/2016  . Screening for prostate cancer 10/16/2016  . Palpitations 07/20/2016    Medications- reviewed and updated Current Outpatient Medications  Medication Sig Dispense Refill  . allopurinol (ZYLOPRIM) 300 MG tablet TAKE 1 TABLET EVERY DAY 90 tablet 3  . ALPRAZolam (XANAX) 0.25 MG tablet TAKE 1 TABLET BY MOUTH TWICE DAILY AS NEEDED FOR VERTIGO. DO NOT EXCEED 2 TABS DAILY 60 tablet 5  . amLODipine (NORVASC) 2.5 MG tablet TAKE 1 TABLET EVERY DAY 90 tablet 3  . buPROPion (WELLBUTRIN XL) 300 MG 24 hr tablet TAKE 1 TABLET EVERY DAY 90 tablet 1  . citalopram (CELEXA) 10 MG tablet Take 1 tablet (10 mg total) by mouth daily. Per Dr. Brett Fairy per patient 90 tablet 3  . clonazePAM (KLONOPIN) 1 MG tablet TAKE 1 TABLET BY MOUTH EVERY DAY AT BEDTIME AS NEEDED. May fill today and monthly thereafter 31 tablet 5  . rosuvastatin (CRESTOR) 10 MG tablet Take 1 tablet (10 mg total) by mouth daily. 90 tablet 3  . vitamin B-12 (CYANOCOBALAMIN) 1000 MCG tablet Take 2,000 mcg  by mouth daily.    [name] Kitchen VITAMIN D PO Take 800 Units by mouth daily in the afternoon.     No current facility-administered medications for this visit.     Objective:  BP 120/70   Pulse 73   Temp 98.6 F (37 C) (Temporal)   Ht 6\' 1"  (1.854 m)   Wt 208 lb 8 oz (94.6 kg)   SpO2 97%   BMI 27.51 kg/m  Gen: NAD, resting comfortably CV: RRR no murmurs rubs or gallops Lungs: CTAB no crackles, wheeze, rhonchi Abdomen: soft/nontender/nondistended/normal bowel sounds. Ext: no edema Skin: warm, dry    Assessment and Plan   # B12 deficiency S: Current  treatment/medication (oral vs. IM):  Took 4 weeks of b12 and now on oral- has been on 2000 units  Lab Results  Component Value Date   VITAMINB12 193 (L) 01/24/2020   A/P: b12 hopefully controlled- encouraged to reduce to 1000 units of b12 per day   # Insomnia S: Patient states he is unable to sleep without clonazepam.  Sleeps well on Klonopin 1 mg nightly.  He is having issues with refills-continues to have issues where he runs a few days short.  Interestingly his state database report is nonfunctional-not showing clonazepam or alprazolam refills.  Alprazolam is from Dr. Brett Fairy for vertigo issues.  Clonazepam has been a long-term prescription for insomnia.  Instructed to not take the alprazolam within 12 hours of taking clonazepam.   Sleeps 8 hours with full tablet. He does not sleep as restfully with half tablet- he is not sure # of hours.  A/P: Thankfully patient has not had any falls recently.  I was very clear if there are any future falls we will have to cut clonazepam in half or stop it completely.  I'm very concerned about him being on both alprazolam and clonazepam.  I did update his prescription for 31 tablets and they can fill today and monthly thereafter-this should solve his issue of running short on medication.  If not we will need a detailed report from pharmacy on fill dates since state database is not functioning properly for him -also advised him to use cane- he declines as reports stability has been better.   # Depression S: Medication: wellbutrin 300mg , citalopram 10mg  . He feels depression is doing slightly better but with ex wife Johnathan Sosa out of town has been somewhat harder A/P: will get team to update phq9- want him to add counseling- continue current meds- not ideal control yet.   #hypertension S: medication: amlodipine 2.5mg  Home readings #s: does not check BP Readings from Last 3 Encounters:  04/25/20 120/70  01/26/20 120/78  01/22/20 120/78  A/P: improved on repeat-  update levels today  #Vitamin D deficiency S: Medication: took 50k units for 12 weeks now on 800 units daily Last vitamin D Lab Results  Component Value Date   VD25OH 20.85 (L) 01/24/2020   A/P: hopefully controlled- update with next labs   #hyperlipidemia S: Medication:rosuvastatin 10mg  up from atorvastatin 10mg   Lab Results  Component Value Date   CHOL 191 01/24/2020   HDL 35.90 (L) 01/24/2020   LDLCALC 71 10/08/2017   LDLDIRECT 110.0 01/24/2020   TRIG 208.0 (H) 01/24/2020   CHOLHDL 5 01/24/2020   A/P: hopefully improved control- update next lipids  Recommended follow up: 3 month follow up to recheck labs and depression Future Appointments  Date Time Provider [place]  06/25/2020 11:00 AM [name], NP [facility] None    Lab/Order associations:  ICD-10-CM   1. Insomnia, unspecified type  G47.00     Meds ordered this encounter  Medications  . clonazePAM (KLONOPIN) 1 MG tablet    Sig: TAKE 1 TABLET BY MOUTH EVERY DAY AT BEDTIME AS NEEDED. May fill today and monthly thereafter    Dispense:  31 tablet    Refill:  5    Not to exceed 5 additional fills before 07/13/2020    Return precautions advised.  [name], MD

## 2020-04-25 ENCOUNTER — Other Ambulatory Visit: Payer: Self-pay | Admitting: Family Medicine

## 2020-04-25 ENCOUNTER — Ambulatory Visit (INDEPENDENT_AMBULATORY_CARE_PROVIDER_SITE_OTHER): Payer: Medicare HMO | Admitting: Family Medicine

## 2020-04-25 ENCOUNTER — Encounter: Payer: Self-pay | Admitting: Family Medicine

## 2020-04-25 ENCOUNTER — Other Ambulatory Visit: Payer: Self-pay

## 2020-04-25 VITALS — BP 120/70 | HR 73 | Temp 98.6°F | Ht 73.0 in | Wt 208.5 lb

## 2020-04-25 DIAGNOSIS — E538 Deficiency of other specified B group vitamins: Secondary | ICD-10-CM

## 2020-04-25 DIAGNOSIS — G4700 Insomnia, unspecified: Secondary | ICD-10-CM | POA: Diagnosis not present

## 2020-04-25 DIAGNOSIS — E559 Vitamin D deficiency, unspecified: Secondary | ICD-10-CM

## 2020-04-25 DIAGNOSIS — I10 Essential (primary) hypertension: Secondary | ICD-10-CM

## 2020-04-25 DIAGNOSIS — E785 Hyperlipidemia, unspecified: Secondary | ICD-10-CM

## 2020-04-25 MED ORDER — CLONAZEPAM 1 MG PO TABS: ORAL_TABLET | ORAL | 5 refills | Status: DC

## 2020-04-25 NOTE — Assessment & Plan Note (Signed)
S: Patient states he is unable to sleep without clonazepam.  Sleeps well on Klonopin 1 mg nightly.  He is having issues with refills-continues to have issues where he runs a few days short.  Interestingly his state database report is nonfunctional-not showing clonazepam or alprazolam refills.  Alprazolam is from Dr. Brett Fairy for vertigo issues.  Clonazepam has been a long-term prescription for insomnia.  Instructed to not take the alprazolam within 12 hours of taking clonazepam.   Sleeps 8 hours with full tablet. He does not sleep as restfully with half tablet- he is not sure # of hours.  A/P: Thankfully patient has not had any falls recently.  I was very clear if there are any future falls we will have to cut clonazepam in half or stop it completely.  I'm very concerned about him being on both alprazolam and clonazepam.  I did update his prescription for 31 tablets and they can fill today and monthly thereafter-this should solve his issue of running short on medication.  If not we will need a detailed report from pharmacy on fill dates since state database is not functioning properly for him

## 2020-05-01 ENCOUNTER — Telehealth: Payer: Self-pay

## 2020-05-01 NOTE — Telephone Encounter (Signed)
[facility] is checking on Medical necessity form and requisition form that they sent in for pt

## 2020-05-03 NOTE — Telephone Encounter (Signed)
[facility] is checking again the status of these forms

## 2020-05-07 NOTE — Telephone Encounter (Signed)
This will not be filled out by our office.

## 2020-05-08 ENCOUNTER — Telehealth: Payer: Self-pay | Admitting: Adult Health

## 2020-05-08 DIAGNOSIS — R42 Dizziness and giddiness: Secondary | ICD-10-CM

## 2020-05-08 DIAGNOSIS — R69 Illness, unspecified: Secondary | ICD-10-CM | POA: Diagnosis not present

## 2020-05-08 NOTE — Telephone Encounter (Signed)
[facility] and [facility] stated that the alprazolam refill is available for pt. I asked about this not being on drug registry and she could not tell me.

## 2020-05-08 NOTE — Telephone Encounter (Signed)
Pt request refill ALPRAZolam (XANAX) 0.25 MG tablet at [id number]

## 2020-06-08 DIAGNOSIS — R69 Illness, unspecified: Secondary | ICD-10-CM | POA: Diagnosis not present

## 2020-06-11 DIAGNOSIS — Z800 Family history of malignant neoplasm of digestive organs: Secondary | ICD-10-CM | POA: Diagnosis not present

## 2020-06-11 DIAGNOSIS — Z8546 Personal history of malignant neoplasm of prostate: Secondary | ICD-10-CM | POA: Diagnosis not present

## 2020-06-11 DIAGNOSIS — Z1379 Encounter for other screening for genetic and chromosomal anomalies: Secondary | ICD-10-CM | POA: Diagnosis not present

## 2020-06-11 DIAGNOSIS — Z8052 Family history of malignant neoplasm of bladder: Secondary | ICD-10-CM | POA: Diagnosis not present

## 2020-06-11 DIAGNOSIS — Z1503 Genetic susceptibility to malignant neoplasm of prostate: Secondary | ICD-10-CM | POA: Diagnosis not present

## 2020-06-11 DIAGNOSIS — Z8042 Family history of malignant neoplasm of prostate: Secondary | ICD-10-CM | POA: Diagnosis not present

## 2020-06-12 DIAGNOSIS — C61 Malignant neoplasm of prostate: Secondary | ICD-10-CM | POA: Diagnosis not present

## 2020-06-19 ENCOUNTER — Other Ambulatory Visit: Payer: Self-pay | Admitting: Neurology

## 2020-06-19 DIAGNOSIS — C61 Malignant neoplasm of prostate: Secondary | ICD-10-CM | POA: Diagnosis not present

## 2020-06-24 ENCOUNTER — Telehealth: Payer: Self-pay

## 2020-06-24 NOTE — Telephone Encounter (Signed)
Noted- have not seen yet

## 2020-06-24 NOTE — Telephone Encounter (Signed)
FYI

## 2020-06-24 NOTE — Telephone Encounter (Signed)
Pt is looking for results from [facility]. He states they will be sending some test results to Dr. [name].

## 2020-06-25 ENCOUNTER — Other Ambulatory Visit: Payer: Self-pay

## 2020-06-25 ENCOUNTER — Encounter: Payer: Self-pay | Admitting: Adult Health

## 2020-06-25 ENCOUNTER — Ambulatory Visit: Payer: Medicare HMO | Admitting: Adult Health

## 2020-06-25 ENCOUNTER — Telehealth: Payer: Self-pay | Admitting: Adult Health

## 2020-06-25 VITALS — BP 128/82 | HR 78 | Ht 73.0 in | Wt 205.4 lb

## 2020-06-25 DIAGNOSIS — R269 Unspecified abnormalities of gait and mobility: Secondary | ICD-10-CM | POA: Diagnosis not present

## 2020-06-25 DIAGNOSIS — S069X9A Unspecified intracranial injury with loss of consciousness of unspecified duration, initial encounter: Secondary | ICD-10-CM

## 2020-06-25 DIAGNOSIS — R42 Dizziness and giddiness: Secondary | ICD-10-CM | POA: Diagnosis not present

## 2020-06-25 DIAGNOSIS — S069XAA Unspecified intracranial injury with loss of consciousness status unknown, initial encounter: Secondary | ICD-10-CM
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
## 2020-06-25 NOTE — Patient Instructions (Signed)
Your Plan:  I will speak to Dr. Brett Fairy and call the pharmacy this afternoon. I will call you with an update this evening.  If your symptoms worsen or you develop new symptoms please let us know.    Thank you for coming to see [place] at [facility]. I hope we have been able to provide you high quality care today.  You may receive a patient satisfaction survey over the next few weeks. We would appreciate your feedback and comments so that we may continue to improve ourselves and the health of our patients.

## 2020-06-25 NOTE — Telephone Encounter (Signed)
[facility] [address] to check on status of [facility]am. Spoke with Summer. She stated that the prescription for the alprazolam expired on 05/18/20. The prescription that was sent over on 11/20/19 had an expiration date of 05/18/20, therefore any remaining refills were voided. In order for the patient to receive any more refills, a new prescription would need to be sent.   Patient had a refill ready for pickup back in September 2021 for 60 tabs but this was never picked up by patient.   I reviewed the RX from 11/20/19 in Epic, it does show an expiration date of 05/18/20.

## 2020-06-25 NOTE — Progress Notes (Signed)
I called the patient and left a voicemail for him to call our office.   I discussed the patient's prescription of Xanax with Dr. Brett Fairy.  She agrees that the patient should not be on Xanax and Klonopin daily.  The Xanax should only be used for episodes of vertigo.  I will discuss with the patient whenever he calls back.

## 2020-06-26 NOTE — Telephone Encounter (Signed)
This is Johnathan Sosa. Pt. Called back regarding medications. He asked that a vm be left if he is not able to answer.

## 2020-06-26 NOTE — Telephone Encounter (Signed)
I called the patient. Advised that per Dr. Brett Fairy we would like the patient to take this medication on an as needed basis and preferably only 1 tablet daily if needed instead of twice a day since he is on Klonopin.   Patient was very upset that he would not be getting 60 tablets but instead of 30 tablets monthly- although he states that he was never taking twice a day consistently only if it was a bad day?  Patient would only like to speak to Dr. Brett Fairy. Advised I would send her a message.

## 2020-06-26 NOTE — Telephone Encounter (Signed)
Pt called again wanting to know the update on his medication. Pt states he is very dizzy without the medication. Please advise.

## 2020-06-26 NOTE — Telephone Encounter (Signed)
Called 6 different locations to get results resent to [place] LVM for two to have them call back to see if hes a patient with them. Patient did not know what facility he sent to. All he stated was [facility]

## 2020-06-27 ENCOUNTER — Telehealth: Payer: Self-pay | Admitting: Neurology

## 2020-06-27 ENCOUNTER — Telehealth: Payer: Self-pay | Admitting: Adult Health

## 2020-06-27 DIAGNOSIS — R42 Dizziness and giddiness: Secondary | ICD-10-CM

## 2020-06-27 DIAGNOSIS — S069XAA Unspecified intracranial injury with loss of consciousness status unknown, initial encounter: Secondary | ICD-10-CM

## 2020-06-27 MED ORDER — ALPRAZOLAM 0.25 MG PO TABS: ORAL_TABLET | ORAL | 0 refills | Status: DC

## 2020-06-27 MED ORDER — ALPRAZOLAM 0.25 MG PO TABS: ORAL_TABLET | ORAL | 3 refills | Status: DC

## 2020-06-27 NOTE — Telephone Encounter (Signed)
Pt states his dizziness is worsening and he does not want to go to the ED because they will not understand.  Pt was given the option to speak with Megan,NP since she called him earlier.  Pt declined holding to speak with her.  Pt states his situation is serious, he is unsure if he has had a stroke.  Pt is only willing to accept a call back from Dr Latrelle Dodrill.  Pt was encouraged to go to the ED if he feels he has had a stroke, he again declined stating they wont understand.  Pt asked it be noted that he is worsening and he very much only wants a call from Dr Latrelle Dodrill.

## 2020-06-27 NOTE — Telephone Encounter (Signed)
Patient called after hours call service reporting that he was not able to fill [facility] at [facility], per pharmacy text message, due to restriction.  I explained to him that it was sent today by Dr. Brett Fairy and that I will have to let her know and review it.

## 2020-06-27 NOTE — Addendum Note (Signed)
Addended by: Trudie Buckler on: 06/27/2020 02:01 PM   Modules accepted: Orders

## 2020-06-27 NOTE — Addendum Note (Signed)
Addended by: Larey Seat on: 06/27/2020 05:12 PM   Modules accepted: Orders

## 2020-06-27 NOTE — Telephone Encounter (Signed)
I called the patient and LVM on cell and home number.

## 2020-06-27 NOTE — Telephone Encounter (Signed)
I called the patient. He was very hostile on the phone. I explained the recommendations by Dr. Brett Fairy. She is amenable to giving him 18 as long as he  understand the risk associated with taking Xanax and Klonopin with advancing age. I reviewed these risks with him. She advised that she does not want him taking a second dose of Xanax after 4 PM if he plans to take Klonopin at bedtime. I explained this to the patient he was still very hostile over the phone. Often yelling that I have withheld his medication for the last 3 days. He also states that he feels that something else is wrong with him. However he made no mention of this in the office visit. Just reiterated that he felt that his dizziness was because he no longer had  Xanax.  I advised the patient that I would send in 1 month supply of Xanax. Advised the patient that I would no longer be communicating with him or seeing him in the office as I did not feel that we had a good patient provider relationship. He will continue communicating with Dr. Brett Fairy and office visits with her.   Johnathan Sosa-the patient is requesting a sooner appointment with Dr. Brett Fairy please reach out to him.

## 2020-06-27 NOTE — Telephone Encounter (Signed)
Pt returned call. Please call back when available. 

## 2020-06-27 NOTE — Telephone Encounter (Signed)
Thanks for update- it looks like plan was communicated to patient. Is there a specific request for me or was this FYI?

## 2020-06-27 NOTE — Telephone Encounter (Signed)
Xanax up to bid, not at night.not after 3 pm- vertigo treatment up to bid.   Only takes Klonopin at night- CD

## 2020-06-27 NOTE — Telephone Encounter (Signed)
Just FYI ! There was a communication problem between patient my NP- and I wanted all of [place] to have this documented. CD

## 2020-06-28 NOTE — Telephone Encounter (Signed)
I ave sent the prescription, I have made his PCP aware of the agreed guidelines, and just after that took place he went to his pharmacy and [facility] reportedly [facility]. [facility] not [facility]. Time to change pharmacies?   Cc Dr [name] for [facility] only.

## 2020-07-03 ENCOUNTER — Other Ambulatory Visit: Payer: Self-pay | Admitting: Neurology

## 2020-07-09 ENCOUNTER — Telehealth: Payer: Self-pay

## 2020-07-09 DIAGNOSIS — H43392 Other vitreous opacities, left eye: Secondary | ICD-10-CM | POA: Diagnosis not present

## 2020-07-09 DIAGNOSIS — H43811 Vitreous degeneration, right eye: Secondary | ICD-10-CM | POA: Diagnosis not present

## 2020-07-09 DIAGNOSIS — H31092 Other chorioretinal scars, left eye: Secondary | ICD-10-CM | POA: Diagnosis not present

## 2020-07-09 DIAGNOSIS — H0102A Squamous blepharitis right eye, upper and lower eyelids: Secondary | ICD-10-CM | POA: Diagnosis not present

## 2020-07-09 DIAGNOSIS — H1045 Other chronic allergic conjunctivitis: Secondary | ICD-10-CM | POA: Diagnosis not present

## 2020-07-09 DIAGNOSIS — H0102B Squamous blepharitis left eye, upper and lower eyelids: Secondary | ICD-10-CM | POA: Diagnosis not present

## 2020-07-09 DIAGNOSIS — H04123 Dry eye syndrome of bilateral lacrimal glands: Secondary | ICD-10-CM | POA: Diagnosis not present

## 2020-07-09 DIAGNOSIS — Z961 Presence of intraocular lens: Secondary | ICD-10-CM | POA: Diagnosis not present

## 2020-07-09 DIAGNOSIS — H26493 Other secondary cataract, bilateral: Secondary | ICD-10-CM | POA: Diagnosis not present

## 2020-07-09 MED ORDER — AMLODIPINE BESYLATE 2.5 MG PO TABS
2.5000 mg | ORAL_TABLET | Freq: Every day | ORAL | 3 refills | Status: DC
Start: 2020-07-09 — End: 2020-12-03

## 2020-07-09 NOTE — Telephone Encounter (Signed)
Have you seen the results [place]?

## 2020-07-09 NOTE — Addendum Note (Signed)
Addended by: Thomes Cake on: 07/09/2020 09:00 AM   Modules accepted: Orders

## 2020-07-09 NOTE — Telephone Encounter (Signed)
I haven't. I'll check tomorrow when im back in the office.

## 2020-07-09 NOTE — Telephone Encounter (Signed)
Please advise when sent to pharmacy

## 2020-07-09 NOTE — Telephone Encounter (Signed)
Patient is calling back regarding these results all the information is its from [facility] patient stated this had to be sent through dr. [name] and the results should've been sent back to our office and as far as I can tell they have no been sent to [place], so if we have any information about where this was sent to or who I can call to get results sent over to [place] again.patient states it was $29 one way shipping for this test to be done and sent off   Please advise  Thank you

## 2020-07-09 NOTE — Telephone Encounter (Signed)
Medication has been sent to the patient's pharmacy.  

## 2020-07-09 NOTE — Telephone Encounter (Signed)
.   LAST APPOINTMENT DATE: 06/24/2020   NEXT APPOINTMENT DATE:@12 /20/2021  MEDICATION:amLODipine (NORVASC) 2.5 MG tablet  [id number] [place], [place] - [address]  **Let patient know to contact pharmacy at the end of the day to make sure medication is ready. **  ** Please notify patient to allow 48-72 hours to process**  **Encourage patient to contact the pharmacy for refills or they can request refills through [facility]**  CLINICAL FILLS OUT ALL BELOW:   LAST REFILL:  QTY:  REFILL DATE:    OTHER COMMENTS:    Okay for refill?  Please advise

## 2020-07-10 NOTE — Telephone Encounter (Signed)
Test results negative/normal. I left this on your desk if youd like to make a copy and have patient pick it up

## 2020-07-10 NOTE — Telephone Encounter (Signed)
Received results and I have placed in [place]'s box to review. Patient has been notified that we have received his results

## 2020-07-11 NOTE — Telephone Encounter (Signed)
Has this been handled?

## 2020-07-12 NOTE — Telephone Encounter (Signed)
Spoke with the patient and he verbalized understanding his results. He was very appreciative for the call. No other questions or concerns at this time.

## 2020-07-17 DIAGNOSIS — R69 Illness, unspecified: Secondary | ICD-10-CM | POA: Diagnosis not present

## 2020-07-29 ENCOUNTER — Telehealth: Payer: Self-pay

## 2020-07-29 NOTE — Telephone Encounter (Signed)
Nurse Assessment Nurse: Raenette Rover, RN, Zella Ball Date/Time Eilene Ghazi Time): 07/24/2020 2:08:05 PM Confirm and document reason for call. If symptomatic, describe symptoms. ---Radiation treatments ended a year ago and has issues with dizziness since he had the treatments. Wandering if this could be from his blood sugar and he currently not diabetic. Does the patient have any new or worsening symptoms? ---Yes Will a triage be completed? ---Yes Related visit to physician within the last 2 weeks? ---No Does the PT have any chronic conditions? (i.e. diabetes, asthma, this includes High risk factors for pregnancy, etc.) ---Yes List chronic conditions. ---hx of cancer, Is this a behavioral health or substance abuse call? ---No Guidelines Guideline Title Affirmed Question Affirmed Notes Nurse Date/Time (Eastern Time) Dizziness - Vertigo [1] NO dizziness now AND [2] age > 35 Raenette Rover, RN, Zella Ball 07/24/2020 2:09:59 PM Disp. Time Eilene Ghazi Time) Disposition Final User 07/24/2020 1:39:52 PM Attempt made - message left Conner, RN, Lesa 07/24/2020 1:51:56 PM Attempt made - message left Windle Guard, RN, Lesa 07/24/2020 1:58:49 PM Send To RN Personal Conner, RN, Lesa 07/24/2020 2:16:57 PM See PCP within 24 Hours Yes Raenette Rover, RN, Zella Ball PLEASE NOTE: All timestamps contained within this report are represented as [place] Standard Time. CONFIDENTIALTY NOTICE: This fax transmission is intended only for the addressee. It contains information that is legally privileged, confidential or otherwise protected from use or disclosure. If you are not the intended recipient, you are strictly prohibited from reviewing, disclosing, copying using or disseminating any of this information or taking any action in reliance on or regarding this information. If you have received this fax in error, please notify us immediately by telephone so that we can arrange for its return to [place]. Phone: [phone], Toll-Free: [phone], Fax:  [phone] Page: 2 of 2 Call Id: [id number] [place] Disagree/Comply Comply Caller Understands Yes PreDisposition Did not know what to do Care Advice Given Per Guideline SEE PCP WITHIN 24 HOURS: FALL PREVENTION: * Sit at side of bed for several minutes before standing up. Stand up slowly. * Avoid sudden movements of head. CALL BACK IF: * Weakness develops in an arm or leg * Dizziness becomes constant * You become worse CARE ADVICE given per Dizziness - Vertigo (Adult) guideline. Comments User: Wilson Singer, RN Date/Time Eilene Ghazi Time): 07/24/2020 2:21:27 PM Was told he is prediabetic. States since he has stopped the sugar and has started feeling better.and no more dizzy spells wants to know if he has diabetes. Advised to follow up with pcp. Referrals REFERRED TO PCP OFFIC

## 2020-07-29 NOTE — Progress Notes (Signed)
Phone [phone] In person visit   Subjective:   Johnathan Sosa is a 74 y.o. year old very pleasant male patient who presents for/with See problem oriented charting Chief Complaint  Patient presents with  . Dizziness    since patients last visit, states it's gotten really bad since his last visit, concerns of DM    This visit occurred during the SARS-CoV-2 public health emergency.  Safety protocols were in place, including screening questions prior to the visit, additional usage of staff PPE, and extensive cleaning of exam room while observing appropriate contact time as indicated for disinfecting solutions.   Past Medical History-  Patient Active Problem List   Diagnosis Date Noted  . Unintentional weight loss 12/21/2017    Priority: High  . Prostate cancer ([place]) 07/13/2017    Priority: High  . 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 History of CVA (cerebrovascular accident) 07/20/2016    Priority: High  . Closed TBI (traumatic brain injury) ([place]) 10/24/2014    Priority: High  . 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 Memory loss     Priority: High  . Vitamin B12 deficiency 01/25/2020    Priority: Medium  . Vitamin D deficiency 01/25/2020    Priority: Medium  . History of adenomatous polyp of colon 01/25/2019    Priority: Medium  . Aortic atherosclerosis ([place]) 12/21/2017    Priority: Medium  . BPPV (benign paroxysmal positional vertigo) 12/21/2016    Priority: Medium  . Attention deficit hyperactivity disorder (ADHD) 07/06/2016    Priority: Medium  . Gout     Priority: Medium  . Insomnia     Priority: Medium  . Hyperlipidemia     Priority: Medium  . Depression, major, single episode, in partial remission ([place])     Priority: Medium  . HTN (hypertension) 04/29/2012    Priority: Medium  . Former smoker 10/01/2015    Priority: Low  . Erectile dysfunction 10/01/2015    Priority: Low  .  Migraine headache     Priority: Low  . Abnormal eye movements 10/24/2014    Priority: Low  . 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 GERD (gastroesophageal reflux disease)     Priority: Low  . Recurrent falls while walking 02/06/2019  . MCI (mild cognitive impairment) with memory loss 02/06/2019  . Hypersomnia with sleep apnea 02/06/2019  . Excessive daytime sleepiness 06/07/2018  . MCI (mild cognitive impairment) 06/07/2018  . Fatigue due to depression 06/07/2018  . Ileus ([place]) 09/04/2017  . Screening for prostate cancer 10/16/2016  . Palpitations 07/20/2016    Medications- reviewed and updated Current Outpatient Medications  Medication Sig Dispense Refill  . allopurinol (ZYLOPRIM) 300 MG tablet TAKE 1 TABLET EVERY DAY 90 tablet 3  . ALPRAZolam (XANAX) 0.25 MG tablet TAKE 1 TABLET BY MOUTH TWICE DAILY AS NEEDED FOR VERTIGO. DO NOT EXCEED 2 TABS DAILY. Second dose not to be taken after 3PM if needed. 60 tablet 3  . amLODipine (NORVASC) 2.5 MG tablet Take 1 tablet (2.5 mg total) by mouth daily. 90 tablet 3  . buPROPion (WELLBUTRIN XL) 300 MG 24 hr tablet TAKE 1 TABLET EVERY DAY 90 tablet 1  . citalopram (CELEXA) 10 MG tablet TAKE 1 TABLET (10 MG TOTAL) BY MOUTH DAILY. PER DR. [name] PER PATIENT 90 tablet 3  . clonazePAM (KLONOPIN) 1 MG tablet TAKE 1 TABLET BY MOUTH EVERY  DAY AT BEDTIME AS NEEDED. May fill today and monthly thereafter 31 tablet 5  . rosuvastatin (CRESTOR) 10 MG tablet Take 1 tablet (10 mg total) by mouth daily. 90 tablet 3  . vitamin B-12 (CYANOCOBALAMIN) 1000 MCG tablet Take 2,000 mcg by mouth daily.    [name] Kitchen VITAMIN D PO Take 800 Units by mouth daily in the afternoon.     No current facility-administered medications for this visit.     Objective:  BP 130/82   Pulse 83   Temp 98 F (36.7 C) (Temporal)   Ht 6\' 1"  (1.854 m)   Wt 202 lb 9.6 oz (91.9 kg)   SpO2 96%   BMI 26.73 kg/m  Gen: NAD, resting comfortably CV: RRR no murmurs rubs or  gallops Lungs: CTAB no crackles, wheeze, rhonchi Abdomen: soft/nontender/nondistended/normal bowel sounds.  Ext: no edema Skin: warm, dry Neuro: CN II-XII intact other than baseline vision loss in left eye plus glasses scraped , sensation and reflexes normal throughout, 5/5 muscle strength in bilateral upper and lower extremities. Normal finger to nose. Normal rapid alternating movements. No pronator drift. Unstable with romberg but does not fall.  Walks very slowly due to reported imbalance but at same time patient declines vertigo symptoms and reports primarily being cautious  Patient closed eyes for most of Dix-Hallpike maneuver but when eyes were briefly opened did see episodes of nystagmus    Assessment and Plan  # Concerns for Diabetes  S:patient with cbg over 100 last viist- wanted to make sure not having vertigo/dizziness related to blood sugar . 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  He is followed by neurology.  At last visit June 25, 2020 they were concerned about him taking Xanax consistently twice a day in addition to Klonopin at bedtime-medication side with primary neurologistAnd this was continued as long as patient does not take second dose of alprazolam after 3 PM  Most recent MRI brain 03/18/20 without clear cause  Patient reports worsening symptoms starting about a month ago and then really bad about a week ago. Thursday symptoms were so bad had to crawl- a lot of room movement. Feels like eating sugary foods sometimes worsens- that is one concern as above for diabetes.  He has had several falls-on one of his falls glasses got scratched up-no significant head trauma reported  No palpitations, chest pain, shortness of breath with  episodes of dizziness.  Yesterday when had episode getting hydrated seemed to help. Having episodes even when driving. I told him he should not drive when having vertigo episode (can get an uber if needed but do not drive as unsafe for both him and others).   ROS-no facial or extremity weakness. No slurred words or trouble swallowing. no blurry vision or double vision. No paresthesias. No confusion or word finding difficulties.   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  Reassuring neurological exam other than imbalance without falls on Romberg -Told patient very strongly do not drive with episodes of vertigo. Patient closed eyes for most of Dix-Hallpike maneuver but when eyes were briefly opened did see episodes of nystagmus-may also have BPPV  -Urgent referral to neurology to get him back in with his primary neurologist -Recommended using walker  at all times due to severity of episodes to avoid falls.  He declines this despite my firm recommendation stating it will not help him if he is falling if he has to grab onto it-recommended holding on at all times when walking to avoid needing to reach out to grab the walker.  He has one at home so does not need prescription -Recommended vestibular rehab-patient declined stating it did not help in the past -With worsening of symptoms recommended MRI of the brain to rule out stroke-patient declines due to potential cost concerns -Recommended trial of meclizine but this would need to be in place of alprazolam and he prefers to stay with alprazolam at this time -He does feel like staying well-hydrated is helpful for him and I encouraged him to do this -Appears to be primarily position change related and not orthostatic-we will hold off on decreasing amlodipine.  Celexa can also cause some orthostatic intolerance but I do not feel strongly about reducing that at this time -Alprazolam typically can increase  fall risk but since this seems to improve his episodes so much we did not discontinue  Recommended follow up: Keep December follow-up to check back in on him in for regular follow-up Future Appointments  Date Time Provider [place]  08/19/2020  3:00 PM Marin Olp, MD [facility]  12/24/2020 10:30 AM [name], Asencion Partridge, MD [facility] None   Lab/Order associations:   ICD-10-CM   1. Dizziness  R42 POCT HgB A1C  2. 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  Patient was resistant to many of my recommendations such as avoiding driving with episodes and importance of walker-spent extra time reinforcing these.  Return precautions advised.  [name], MD

## 2020-07-30 ENCOUNTER — Other Ambulatory Visit: Payer: Self-pay

## 2020-07-30 ENCOUNTER — Encounter: Payer: Self-pay | Admitting: Family Medicine

## 2020-07-30 ENCOUNTER — Ambulatory Visit (INDEPENDENT_AMBULATORY_CARE_PROVIDER_SITE_OTHER): Payer: Medicare HMO | Admitting: Family Medicine

## 2020-07-30 VITALS — BP 130/82 | HR 83 | Temp 98.0°F | Ht 73.0 in | Wt 202.6 lb

## 2020-07-30 DIAGNOSIS — S069X9A Unspecified intracranial injury with loss of consciousness of unspecified duration, initial encounter: Secondary | ICD-10-CM | POA: Diagnosis not present

## 2020-07-30 DIAGNOSIS — R739 Hyperglycemia, unspecified: Secondary | ICD-10-CM

## 2020-07-30 DIAGNOSIS — R42 Dizziness and giddiness: Secondary | ICD-10-CM

## 2020-07-30 DIAGNOSIS — S069XAA Unspecified intracranial injury with loss of consciousness status unknown, initial encounter: Secondary | ICD-10-CM

## 2020-07-30 LAB — POCT GLYCOSYLATED HEMOGLOBIN (HGB A1C): Hemoglobin A1C: 5.3 % (ref 4.0–5.6)

## 2020-07-30 NOTE — Patient Instructions (Addendum)
We discussed he should not drive when having vertigo episode (can get an uber if needed but do not drive as unsafe for both him and others).   Urgent referral to neurology.   Would recommend using a walker at all times due to severity of episodes to avoid falls.   We discussed the following: 1. Vestibular rehab (you declined) 2. MRI (declined due to cost concerns) 3. Meclizine trial (but you prefer xanax/alprazolam)  Recommended follow up: keep December visit or sooner if needed

## 2020-08-05 ENCOUNTER — Encounter: Payer: Self-pay | Admitting: Neurology

## 2020-08-05 ENCOUNTER — Ambulatory Visit: Payer: Medicare HMO | Admitting: Neurology

## 2020-08-05 VITALS — BP 123/82 | HR 78 | Ht 74.0 in | Wt 202.0 lb

## 2020-08-05 DIAGNOSIS — R270 Ataxia, unspecified: Secondary | ICD-10-CM | POA: Diagnosis not present

## 2020-08-05 DIAGNOSIS — R292 Abnormal reflex: Secondary | ICD-10-CM | POA: Diagnosis not present

## 2020-08-05 DIAGNOSIS — R42 Dizziness and giddiness: Secondary | ICD-10-CM

## 2020-08-05 DIAGNOSIS — S069X9A Unspecified intracranial injury with loss of consciousness of unspecified duration, initial encounter: Secondary | ICD-10-CM

## 2020-08-05 DIAGNOSIS — G3184 Mild cognitive impairment, so stated: Secondary | ICD-10-CM | POA: Diagnosis not present

## 2020-08-05 DIAGNOSIS — R208 Other disturbances of skin sensation: Secondary | ICD-10-CM | POA: Diagnosis not present

## 2020-08-05 DIAGNOSIS — R799 Abnormal finding of blood chemistry, unspecified: Secondary | ICD-10-CM | POA: Diagnosis not present

## 2020-08-05 MED ORDER — ALPRAZOLAM 0.25 MG PO TABS: ORAL_TABLET | ORAL | 3 refills | Status: DC

## 2020-08-05 MED ORDER — CLONAZEPAM 1 MG PO TABS: ORAL_TABLET | ORAL | 1 refills | Status: DC

## 2020-08-05 NOTE — Patient Instructions (Addendum)
Alprazolam tablets What is this medicine? ALPRAZOLAM (al PRAY zoe lam) is a benzodiazepine. It is used to treat anxiety and panic attacks. This medicine may be used for other purposes; ask your health care provider or pharmacist if you have questions. COMMON BRAND NAME(S): Xanax What should I tell my health care provider before I take this medicine? They need to know if you have any of these conditions:  an alcohol or drug abuse problem  bipolar disorder, depression, psychosis or other mental health conditions  glaucoma  kidney or liver disease  lung or breathing disease  myasthenia gravis  Parkinson's disease  porphyria  seizures or a history of seizures  suicidal thoughts  an unusual or allergic reaction to alprazolam, other benzodiazepines, foods, dyes, or preservatives  pregnant or trying to get pregnant  breast-feeding How should I use this medicine? Take this medicine by mouth with a glass of water. Follow the directions on the prescription label. Take your medicine at regular intervals. Do not take it more often than directed. Do not stop taking except on your doctor's advice. A special MedGuide will be given to you by the pharmacist with each prescription and refill. Be sure to read this information carefully each time. Talk to your pediatrician regarding the use of this medicine in children. Special care may be needed. Overdosage: If you think you have taken too much of this medicine contact a poison control center or emergency room at once. NOTE: This medicine is only for you. Do not share this medicine with others. What if I miss a dose? If you miss a dose, take it as soon as you can. If it is almost time for your next dose, take only that dose. Do not take double or extra doses. What may interact with this medicine? Do not take this medicine with any of the following medications:  certain antiviral medicines for HIV or AIDS like delavirdine, indinavir   certain medicines for fungal infections like ketoconazole and itraconazole  narcotic medicines for cough  sodium oxybate This medicine may also interact with the following medications:  alcohol  antihistamines for allergy, cough and cold  certain antibiotics like clarithromycin, erythromycin, isoniazid, rifampin, rifapentine, rifabutin, and troleandomycin  certain medicines for blood pressure, heart disease, irregular heart beat  certain medicines for depression, like amitriptyline, fluoxetine, sertraline  certain medicines for seizures like carbamazepine, oxcarbazepine, phenobarbital, phenytoin, primidone  cimetidine  cyclosporine  male hormones, like estrogens or progestins and birth control pills, patches, rings, or injections  general anesthetics like halothane, isoflurane, methoxyflurane, propofol  grapefruit juice  local anesthetics like lidocaine, pramoxine, tetracaine  medicines that relax muscles for surgery  narcotic medicines for pain  other antiviral medicines for HIV or AIDS  phenothiazines like chlorpromazine, mesoridazine, prochlorperazine, thioridazine This list may not describe all possible interactions. Give your health care provider a list of all the medicines, herbs, non-prescription drugs, or dietary supplements you use. Also tell them if you smoke, drink alcohol, or use illegal drugs. Some items may interact with your medicine. What should I watch for while using this medicine? Tell your doctor or health care professional if your symptoms do not start to get better or if they get worse. Do not stop taking except on your doctor's advice. You may develop a severe reaction. Your doctor will tell you how much medicine to take. You may get drowsy or dizzy. Do not drive, use machinery, or do anything that needs mental alertness until you know how this medicine  affects you. To reduce the risk of dizzy and fainting spells, do not stand or sit up quickly,  especially if you are an older patient. Alcohol may increase dizziness and drowsiness. Avoid alcoholic drinks. If you are taking another medicine that also causes drowsiness, you may have more side effects. Give your health care provider a list of all medicines you use. Your doctor will tell you how much medicine to take. Do not take more medicine than directed. Call emergency for help if you have problems breathing or unusual sleepiness. What side effects may I notice from receiving this medicine? Side effects that you should report to your doctor or health care professional as soon as possible:  allergic reactions like skin rash, itching or hives, swelling of the face, lips, or tongue  breathing problems  confusion  loss of balance or coordination  signs and symptoms of low blood pressure like dizziness; feeling faint or lightheaded, falls; unusually weak or tired  suicidal thoughts or other mood changes Side effects that usually do not require medical attention (report to your doctor or health care professional if they continue or are bothersome):  dizziness  dry mouth  nausea, vomiting  tiredness This list may not describe all possible side effects. Call your doctor for medical advice about side effects. You may report side effects to FDA at 1-800-FDA-1088. Where should I keep my medicine? Keep out of the reach of children. This medicine can be abused. Keep your medicine in a safe place to protect it from theft. Do not share this medicine with anyone. Selling or giving away this medicine is dangerous and against the law. Store at room temperature between 20 and 25 degrees C (68 and 77 degrees F). This medicine may cause accidental overdose and death if taken by other adults, children, or pets. Mix any unused medicine with a substance like cat litter or coffee grounds. Then throw the medicine away in a sealed container like a sealed bag or a coffee can with a lid. Do not use the  medicine after the expiration date. NOTE: This sheet is a summary. It may not cover all possible information. If you have questions about this medicine, talk to your doctor, pharmacist, or health care provider.  2020 Elsevier/Gold Standard (2015-05-16 13:47:25)  Fall Prevention in the Home, Adult Falls can cause injuries. They can happen to people of all ages. There are many things you can do to make your home safe and to help prevent falls. Ask for help when making these changes, if needed. What actions can I take to prevent falls? General Instructions  Use good lighting in all rooms. Replace any light bulbs that burn out.  Turn on the lights when you go into a dark area. Use night-lights.  Keep items that you use often in easy-to-reach places. Lower the shelves around your home if necessary.  Set up your furniture so you have a clear path. Avoid moving your furniture around.  Do not have throw rugs and other things on the floor that can make you trip.  Avoid walking on wet floors.  If any of your floors are uneven, fix them.  Add color or contrast paint or tape to clearly mark and help you see: ? Any grab bars or handrails. ? First and last steps of stairways. ? Where the edge of each step is.  If you use a stepladder: ? Make sure that it is fully opened. Do not climb a closed stepladder. ? Make sure that  both sides of the stepladder are locked into place. ? Ask someone to hold the stepladder for you while you use it.  If there are any pets around you, be aware of where they are. What can I do in the bathroom?      Keep the floor dry. Clean up any water that spills onto the floor as soon as it happens.  Remove soap buildup in the tub or shower regularly.  Use non-skid mats or decals on the floor of the tub or shower.  Attach bath mats securely with double-sided, non-slip rug tape.  If you need to sit down in the shower, use a plastic, non-slip stool.  Install grab  bars by the toilet and in the tub and shower. Do not use towel bars as grab bars. What can I do in the bedroom?  Make sure that you have a light by your bed that is easy to reach.  Do not use any sheets or blankets that are too big for your bed. They should not hang down onto the floor.  Have a firm chair that has side arms. You can use this for support while you get dressed. What can I do in the kitchen?  Clean up any spills right away.  If you need to reach something above you, use a strong step stool that has a grab bar.  Keep electrical cords out of the way.  Do not use floor polish or wax that makes floors slippery. If you must use wax, use non-skid floor wax. What can I do with my stairs?  Do not leave any items on the stairs.  Make sure that you have a light switch at the top of the stairs and the bottom of the stairs. If you do not have them, ask someone to add them for you.  Make sure that there are handrails on both sides of the stairs, and use them. Fix handrails that are broken or loose. Make sure that handrails are as long as the stairways.  Install non-slip stair treads on all stairs in your home.  Avoid having throw rugs at the top or bottom of the stairs. If you do have throw rugs, attach them to the floor with carpet tape.  Choose a carpet that does not hide the edge of the steps on the stairway.  Check any carpeting to make sure that it is firmly attached to the stairs. Fix any carpet that is loose or worn. What can I do on the outside of my home?  Use bright outdoor lighting.  Regularly fix the edges of walkways and driveways and fix any cracks.  Remove anything that might make you trip as you walk through a door, such as a raised step or threshold.  Trim any bushes or trees on the path to your home.  Regularly check to see if handrails are loose or broken. Make sure that both sides of any steps have handrails.  Install guardrails along the edges of any  raised decks and porches.  Clear walking paths of anything that might make someone trip, such as tools or rocks.  Have any leaves, snow, or ice cleared regularly.  Use sand or salt on walking paths during winter.  Clean up any spills in your garage right away. This includes grease or oil spills. What other actions can I take?  Wear shoes that: ? Have a low heel. Do not wear high heels. ? Have rubber bottoms. ? Are comfortable and fit you  well. ? Are closed at the toe. Do not wear open-toe sandals.  Use tools that help you move around (mobility aids) if they are needed. These include: ? Canes. ? Walkers. ? Scooters. ? Crutches.  Review your medicines with your doctor. Some medicines can make you feel dizzy. This can increase your chance of falling. Ask your doctor what other things you can do to help prevent falls. Where to find more information  Centers for Disease Control and Prevention, [employer]: https://garcia.biz/  [employer] on Aging: BrainJudge.co.uk Contact a doctor if:  You are afraid of falling at home.  You feel weak, drowsy, or dizzy at home.  You fall at home. Summary  There are many simple things that you can do to make your home safe and to help prevent falls.  Ways to make your home safe include removing tripping hazards and installing grab bars in the bathroom.  Ask for help when making these changes in your home. This information is not intended to replace advice given to you by your health care provider. Make sure you discuss any questions you have with your health care provider. Document Revised: 12/08/2018 Document Reviewed: 04/01/2017 Elsevier Patient Education  2020 [place] is a condition that causes unsteadiness when walking and standing, poor coordination of body movements, and difficulty keeping a straight (upright) posture. It occurs because of a problem with the part of the brain that controls coordination  and stability (cerebellum). Ataxia can develop later in life (acquired ataxia), during your 20s or 30s or even into your 60s or later. This type of ataxia develops when another medical condition, such as a stroke, damages the cerebellum. Ataxia also may be present early in life (non-acquired ataxia). There are two main types of non-acquired ataxia:  Congenital. This type is present at birth.  Hereditary. This type is passed from parent to child. The most common form of hereditary non-acquired ataxia is Friedreich ataxia. What are the causes? Acquired ataxia may be caused by:  Changes in the nervous system (neurodegenerative changes).  Changes throughout the body (systemic disorders).  A lot of exposure to: ? Certain medicines such as phenytoin and lithium. ? Solvents. These are cleaning fluids such as paint thinner, nail polish remover, carpet cleaner, and degreasers.  Alcohol abuse (alcoholism).  Medical conditions, such as: ? Celiac disease. ? Hypothyroidism. ? A lack (deficiency) of vitamin E, vitamin B12, or thiamine. ? Brain tumors. ? Multiple sclerosis. ? Cerebral palsy. ? Stroke. ? Paraneoplastic syndromes. ? Viral infections. ? Head injury. ? Malnutrition. Congenital and hereditary ataxia are caused by problems that are present in genes before birth. What are the signs or symptoms? Signs and symptoms of ataxia vary depending on the cause. They may include:  Being unsteady.  Walking with the legs wide apart (wide stance) to keep one's balance.  Uncontrolled shaking (tremor).  Poorly coordinated body movements.  Difficulty maintaining an upright posture.  Fatigue.  Changes in speech.  Changes in vision.  Involuntary eye movements (nystagmus).  Difficulty swallowing.  Difficulty writing.  Muscle tightening that you cannot control (muscle spasms). How is this diagnosed? Ataxia may be diagnosed based on:  Your personal and family medical history.  A  physical exam.  Imaging tests, such as a CT scan or MRI.  Spinal tap (lumbar puncture). This procedure involves using a needle to take a sample of the fluid around your brain and spinal cord.  Genetic testing. How is this treated? The underlying condition that  causes your ataxia needs to be treated. If the cause is a brain tumor, you may need surgery. Treatment also focuses on helping you live with ataxia and improving your quality of life (supportive treatments). This may involve:  Learning ways to improve coordination and move around more carefully (physical therapy).  Learning ways to improve your ability to do daily tasks, such as bathing and feeding yourself (occupational therapy).  Using devices to help you move around, eat, or communicate (assistive devices), such as a walker, modified eating utensils, and communication aids.  Learning ways to improve speech and swallowing (speech therapy). Follow these instructions at home: Preventing falls  Lie down right away if you become very unsteady, dizzy, or nauseous, or if you feel like you are going to faint. Do not get up until all of those feelings pass.  Keep your home well-lit. Use night-lights as needed.  Remove tripping hazards, such as rugs, cords, and clutter.  Install grab bars by the toilet and in the tub and shower.  Use assistive devices such as a cane, walker, or wheelchair as needed to keep your balance. General instructions  Do not drink alcohol.  Ask your health care provider what activities are safe for you, and what activities you should avoid.  Take over-the-counter and prescription medicines only as told by your health care provider. Get help right away if you:  Have unsteadiness that suddenly worsens.  Have any of these: ? Severe headaches. ? Chest pain. ? Abdominal pain. ? Weakness or numbness on one side of your body. ? Vision problems. ? Difficulty speaking. ? An irregular heartbeat. ? A very  fast pulse.  Feel confused. Summary  Ataxia is a condition that causes unsteadiness when walking and standing, poor coordination of body movements, and difficulty keeping a straight (upright) posture.  Ataxia occurs because of a problem with the part of the brain that controls coordination and stability (cerebellum).  The underlying condition that causes your ataxia needs to be treated. Treatment also focuses on helping you live with ataxia and improving your quality of life (supportive treatments).  Lie down right away if you become very unsteady, dizzy, or nauseous, or if you feel like you are going to faint. This information is not intended to replace advice given to you by your health care provider. Make sure you discuss any questions you have with your health care provider. Document Revised: 07/30/2017 Document Reviewed: 06/18/2017 Elsevier Patient Education  [place].
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
## 2020-08-06 ENCOUNTER — Telehealth: Payer: Self-pay | Admitting: Neurology

## 2020-08-06 NOTE — Telephone Encounter (Signed)
[employer] medicare order sent to [facility]. They will obtain the auth and reach out to the patient to schedule.  °

## 2020-08-07 ENCOUNTER — Other Ambulatory Visit: Payer: Self-pay | Admitting: Neurology

## 2020-08-07 LAB — PROTEIN ELECTROPHORESIS, SERUM
A/G Ratio: 1.5 (ref 0.7–1.7)
Albumin ELP: 4 g/dL (ref 2.9–4.4)
Alpha 1: 0.2 g/dL (ref 0.0–0.4)
Alpha 2: 0.9 g/dL (ref 0.4–1.0)
Beta: 1 g/dL (ref 0.7–1.3)
Gamma Globulin: 0.6 g/dL (ref 0.4–1.8)
Globulin, Total: 2.7 g/dL (ref 2.2–3.9)

## 2020-08-07 LAB — COMPREHENSIVE METABOLIC PANEL
ALT: 23 IU/L (ref 0–44)
AST: 18 IU/L (ref 0–40)
Albumin/Globulin Ratio: 2 (ref 1.2–2.2)
Albumin: 4.5 g/dL (ref 3.7–4.7)
Alkaline Phosphatase: 63 IU/L (ref 44–121)
BUN/Creatinine Ratio: 15 (ref 10–24)
BUN: 17 mg/dL (ref 8–27)
Bilirubin Total: 0.6 mg/dL (ref 0.0–1.2)
CO2: 21 mmol/L (ref 20–29)
Calcium: 10.2 mg/dL (ref 8.6–10.2)
Chloride: 102 mmol/L (ref 96–106)
Creatinine, Ser: 1.14 mg/dL (ref 0.76–1.27)
GFR calc Af Amer: 74 mL/min/{1.73_m2} (ref >59)
GFR calc non Af Amer: 64 mL/min/{1.73_m2} (ref >59)
Globulin, Total: 2.2 g/dL (ref 1.5–4.5)
Glucose: 80 mg/dL (ref 65–99)
Potassium: 4.2 mmol/L (ref 3.5–5.2)
Sodium: 139 mmol/L (ref 134–144)
Total Protein: 6.7 g/dL (ref 6.0–8.5)

## 2020-08-07 LAB — CBC
Hematocrit: 44.3 % (ref 37.5–51.0)
Hemoglobin: 15.8 g/dL (ref 13.0–17.7)
MCH: 31.6 pg (ref 26.6–33.0)
MCHC: 35.7 g/dL (ref 31.5–35.7)
MCV: 89 fL (ref 79–97)
Platelets: 242 10*3/uL (ref 150–450)
RBC: 5 x10E6/uL (ref 4.14–5.80)
RDW: 12.9 % (ref 11.6–15.4)
WBC: 7.8 10*3/uL (ref 3.4–10.8)

## 2020-08-07 LAB — B12 AND FOLATE PANEL
Folate: 7.9 ng/mL (ref >3.0)
Vitamin B-12: 444 pg/mL (ref 232–1245)

## 2020-08-07 LAB — SEDIMENTATION RATE: Sed Rate: 3 mm/hr (ref 0–30)

## 2020-08-07 LAB — TSH: TSH: 1.66 u[IU]/mL (ref 0.450–4.500)

## 2020-08-07 LAB — FERRITIN: Ferritin: 250 ng/mL (ref 30–400)

## 2020-08-07 NOTE — Progress Notes (Signed)
Normal protein e -phoresis. No M spike , normal CMET, B12 vitamin, Thyroid hormone and sed rate .

## 2020-08-08 ENCOUNTER — Telehealth: Payer: Self-pay | Admitting: Neurology

## 2020-08-08 NOTE — Telephone Encounter (Signed)
Called the patient and advised that the lab results were in normal range. Advised the patient there was nothing concerning.   Pt was inquiring about medication messages that are coming. It is indicating that 7 scripts are sent to pharmacy. I informed him that our office is only sending 3 prescriptions for him. Informed him that we fill alprazolam which Dr Brett Fairy printed for him to take to [facility]. Advised our office has started a new thing where it sends out messages in regards to medications. Advised that there are only 3 that we handle for him and to disregard.  Pt argued back and forth with me for 5 min about how he can see the message where there should be 7 scripts. Advised again those could be duplicate messages but as of now he has hard copy for [facility] for alprazolam, clonazepam was sent to [facility] for him and celexa can be refilled for him when he it is needed.   Johnathan Sosa then said insurance would not cover the xanax medication, I advised that if insurance doesn't cover a medication then the pharmacy will send a notification that a PA is needed and at that time we will complete if needed. Patient then spent another 5 min arguing that pharmacy had sent [place] a request and he called and we had not done it and he had to pay out of pocket. I asked the patient had he turned the hard copy script into [facility] yet, he says no. I advised to go and take the script to them and let them process through insurance first. Advised that I will keep eye out if Josem Kaufmann is needed.   Pt continued to argue and I finally had to end the conversation with have a good day.

## 2020-08-08 NOTE — Telephone Encounter (Signed)
-----   Message from Larey Seat, MD sent at 08/07/2020  4:44 PM EST ----- Normal protein e -phoresis. No M spike , normal CMET, B12 vitamin, Thyroid hormone and sed rate .

## 2020-08-17 ENCOUNTER — Ambulatory Visit
Admission: RE | Admit: 2020-08-17 | Discharge: 2020-08-17 | Disposition: A | Discharge status: Home / Self Care | Payer: Medicare HMO | Source: Ambulatory Visit | Attending: Neurology | Admitting: Neurology

## 2020-08-17 DIAGNOSIS — R270 Ataxia, unspecified: Secondary | ICD-10-CM

## 2020-08-17 MED ORDER — GADOBENATE DIMEGLUMINE 529 MG/ML IV SOLN
20.0000 mL | Freq: Once | INTRAVENOUS | Status: AC | PRN
  Administered 2020-08-17: 20 mL via INTRAVENOUS

## 2020-08-19 ENCOUNTER — Other Ambulatory Visit: Payer: Self-pay

## 2020-08-19 ENCOUNTER — Encounter: Payer: Self-pay | Admitting: Family Medicine

## 2020-08-19 ENCOUNTER — Ambulatory Visit (INDEPENDENT_AMBULATORY_CARE_PROVIDER_SITE_OTHER): Payer: Medicare HMO | Admitting: Family Medicine

## 2020-08-19 VITALS — BP 122/70 | HR 88 | Temp 98.6°F | Resp 18 | Ht 74.0 in | Wt 199.0 lb

## 2020-08-19 DIAGNOSIS — S069X9A Unspecified intracranial injury with loss of consciousness of unspecified duration, initial encounter: Secondary | ICD-10-CM | POA: Diagnosis not present

## 2020-08-19 DIAGNOSIS — S069XAA Unspecified intracranial injury with loss of consciousness status unknown, initial encounter: Secondary | ICD-10-CM

## 2020-08-19 DIAGNOSIS — E785 Hyperlipidemia, unspecified: Secondary | ICD-10-CM

## 2020-08-19 DIAGNOSIS — R42 Dizziness and giddiness: Secondary | ICD-10-CM

## 2020-08-19 DIAGNOSIS — I10 Essential (primary) hypertension: Secondary | ICD-10-CM | POA: Diagnosis not present

## 2020-08-19 NOTE — Patient Instructions (Addendum)
Glad you are doing better!  No changes today  Recommended follow up: Return in about 6 months (around 02/17/2021) for physical or sooner if needed.

## 2020-08-19 NOTE — Progress Notes (Signed)
Phone [phone] In person visit   Subjective:   Johnathan Sosa is a 74 y.o. year old very pleasant male patient who presents for/with See problem oriented charting Chief Complaint  Patient presents with  . Depression  . Atrial Fibrillation   This visit occurred during the SARS-CoV-2 public health emergency.  Safety protocols were in place, including screening questions prior to the visit, additional usage of staff PPE, and extensive cleaning of exam room while observing appropriate contact time as indicated for disinfecting solutions.   Past Medical History-  Patient Active Problem List   Diagnosis Date Noted  . Unintentional weight loss 12/21/2017    Priority: High  . Prostate cancer ([place]) 07/13/2017    Priority: High  . 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 History of CVA (cerebrovascular accident) 07/20/2016    Priority: High  . Closed TBI (traumatic brain injury) ([place]) 10/24/2014    Priority: High  . 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 Memory loss     Priority: High  . Vitamin B12 deficiency 01/25/2020    Priority: Medium  . Vitamin D deficiency 01/25/2020    Priority: Medium  . History of adenomatous polyp of colon 01/25/2019    Priority: Medium  . Aortic atherosclerosis ([place]) 12/21/2017    Priority: Medium  . BPPV (benign paroxysmal positional vertigo) 12/21/2016    Priority: Medium  . Attention deficit hyperactivity disorder (ADHD) 07/06/2016    Priority: Medium  . Gout     Priority: Medium  . Insomnia     Priority: Medium  . Hyperlipidemia     Priority: Medium  . Depression, major, single episode, in partial remission ([place])     Priority: Medium  . HTN (hypertension) 04/29/2012    Priority: Medium  . Former smoker 10/01/2015    Priority: Low  . Erectile dysfunction 10/01/2015    Priority: Low  . Migraine headache     Priority: Low  . Abnormal eye movements 10/24/2014     Priority: Low  . 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 GERD (gastroesophageal reflux disease)     Priority: Low  . Ataxia 08/05/2020  . 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 Vibration sensory loss 08/05/2020  . Recurrent falls while walking 02/06/2019  . MCI (mild cognitive impairment) with memory loss 02/06/2019  . Hypersomnia with sleep apnea 02/06/2019  . Excessive daytime sleepiness 06/07/2018  . MCI (mild cognitive impairment) 06/07/2018  . Fatigue due to depression 06/07/2018  . Ileus ([place]) 09/04/2017  . Screening for prostate cancer 10/16/2016  . Palpitations 07/20/2016    Medications- reviewed and updated Current Outpatient Medications  Medication Sig Dispense Refill  . allopurinol (ZYLOPRIM) 300 MG tablet TAKE 1 TABLET EVERY DAY 90 tablet 3  . ALPRAZolam (XANAX) 0.25 MG tablet TAKE 1 TABLET BY MOUTH TWICE DAILY AS NEEDED FOR VERTIGO. DO NOT EXCEED 2 TABS DAILY. Second dose not to be taken after 3PM if needed. 60 tablet 3  . amLODipine (NORVASC) 2.5 MG tablet Take 1 tablet (2.5 mg total) by mouth daily. 90 tablet 3  . buPROPion (WELLBUTRIN XL) 300 MG 24 hr tablet TAKE 1 TABLET EVERY DAY 90 tablet 1  . citalopram (CELEXA) 10 MG tablet TAKE 1 TABLET (10 MG TOTAL) BY MOUTH DAILY. PER DR. [name] PER PATIENT 90 tablet 3  . clonazePAM (KLONOPIN) 1 MG tablet TAKE 1 TABLET BY MOUTH EVERY DAY  AT BEDTIME AS NEEDED. May fill today and monthly thereafter 90 tablet 1  . rosuvastatin (CRESTOR) 10 MG tablet Take 1 tablet (10 mg total) by mouth daily. 90 tablet 3  . vitamin B-12 (CYANOCOBALAMIN) 1000 MCG tablet Take 2,000 mcg by mouth daily.    [name] Kitchen VITAMIN D PO Take 800 Units by mouth daily in the afternoon.     No current facility-administered medications for this visit.     Objective:  BP 122/70   Pulse 88   Temp 98.6 F (37 C) (Temporal)   Resp 18   Ht 6\' 2"  (1.88 m)   Wt 199 lb (90.3 kg)   SpO2 97%   BMI 25.55 kg/m  Gen: NAD, resting comfortably CV: RRR  no murmurs rubs or gallops Lungs: CTAB no crackles, wheeze, rhonchi Ext: no edema Skin: warm, dry Neuro: Seems more stable on his feet    Assessment and Plan   # vertigo S:severe episodes- have advised patient not to drive for concern that he could have an episode while driving.   Last visit referred back to neurology (he agreed to see Dr. Brett Fairy) and recommend e use walker at all times (which he declined) . Positive dix hallpike- he declined referral to BPPV. Declined trial meclizine over alprazolam. Encouraged to increase ydration. Issues position change related and not orthostatic  Had updated MRI of the brain since last visit largely reassuring other than some small vessel/microvascular disease.  Extensive blood work was largely reassuring  He now reports taking only alprazolam once a day- has only had to take 2nd time once in last month. Sounds like symptoms have improved overall since last visit- he feels like drinking water and avoiding sweets intake   Dr. Maureen Chatters was concerned about BID xanax with klonopin at night bu thankfully has cut back. Extensive bloodwork just done A/P: Thankfully flareup of vertigo from last month is much improved (he reports increasing hydration has helped and may have had an orthostatic element) and he has decreased his alprazolam usage to primarily once a day-is only use twice a day once in the last month he reports.  Continue close follow-up with neurology-see discussion under depression/insomnia about possibly reducing clonazepam  #hypertension S: medication: amlodipine 2.5mg   Home readings #s: does not have cuff BP Readings from Last 3 Encounters:  08/19/20 122/70  08/05/20 123/82  07/30/20 130/82  A/P: Stable. Continue current medications.   #hyperlipidemia S: Medication:rosuvastatin 10mg   (changed from atorvastatin 10 mg last visit) Lab Results  Component Value Date   CHOL 191 01/24/2020   HDL 35.90 (L) 01/24/2020   LDLCALC 71 10/08/2017    LDLDIRECT 110.0 01/24/2020   TRIG 208.0 (H) 01/24/2020   CHOLHDL 5 01/24/2020   A/P: hopefully controlled-we discussed updating labs today but recently had extensive blood work with neurology-in the end we opted to repeat in 6 months - also is going to try to do some weight lifting- states will do from floor with vertigo issues  # Depression/insomnia S: Medication: Wellbutrin 300 mg with Klonopin 1 mg for sleep Depression screen [facility] 2/9 08/19/2020 04/25/2020 01/22/2020  Decreased Interest 0 0 3  Down, Depressed, Hopeless 1 0 3  PHQ - 2 Score 1 0 6  Altered sleeping 0 0 0  Tired, decreased energy 1 1 -  Change in appetite 0 0 3  Feeling bad or failure about yourself  0 0 0  Trouble concentrating 0 0 0  Moving slowly or fidgety/restless 1 0 0  Suicidal thoughts  0 0 0  PHQ-9 Score 3 1 9   Difficult doing work/chores Not difficult at all Somewhat difficult Somewhat difficult  Some recent data might be hidden  A/P: Reasonable control-continue current medication.  Dr. Brett Fairy has been concerned about Xanax twice a day along with Klonopin in the evening even though patient's last dose of Xanax is at 3 PM-fortunately he has been able to cut down on his Xanax intake and is mainly taking once a day-would strongly consider transition to trazodone in the future if alprazolam usage increases again  Recommended follow up: Return in about 6 months (around 02/17/2021) for physical or sooner if needed. Future Appointments  Date Time Provider [place]  12/24/2020 10:30 AM [name], Asencion Partridge, MD [facility] None  02/18/2021  2:20 PM Marin Olp, MD [facility]    Lab/Order associations: No diagnosis found.  No orders of the defined types were placed in this encounter.     Return precautions advised.  [name], MD

## 2020-08-27 ENCOUNTER — Telehealth: Payer: Self-pay | Admitting: *Deleted

## 2020-08-27 NOTE — Telephone Encounter (Signed)
I spoke to the patient and provided him with the results. He verbalized understanding.

## 2020-08-28 ENCOUNTER — Telehealth: Payer: Self-pay | Admitting: Family Medicine

## 2020-08-28 NOTE — Telephone Encounter (Signed)
MAILBOX FULL. Pt due to schedule Medicare Annual Wellness Visit (AWV) either virtually OR in office.   Last AWV 07/20/19; please schedule at anytime with [facility] at [facility].  This should be a 45 minute visit.

## 2020-11-19 ENCOUNTER — Telehealth: Payer: Self-pay | Admitting: Family Medicine

## 2020-11-19 NOTE — Chronic Care Management (AMB) (Signed)
  Chronic Care Management   Outreach Note  11/19/2020 Name: Johnathan Sosa MRN: [id number] DOB: 12-09-1945  Referred by: Marin Olp, MD Reason for referral : No chief complaint on file.   An unsuccessful telephone outreach was attempted today. The patient was referred to the pharmacist for assistance with care management and care coordination.   Follow Up Plan:   Lauretta Grill Upstream Scheduler

## 2020-11-21 ENCOUNTER — Telehealth: Payer: Self-pay | Admitting: Family Medicine

## 2020-11-21 NOTE — Progress Notes (Signed)
  Chronic Care Management   Outreach Note  11/21/2020 Name: Johnathan Sosa MRN: [id number] DOB: [birth year]  Referred by: Marin Olp, MD Reason for referral : No chief complaint on file.   A second unsuccessful telephone outreach was attempted today. The patient was referred to pharmacist for assistance with care management and care coordination.  Follow Up Plan:   Lauretta Grill Upstream Scheduler

## 2020-11-22 ENCOUNTER — Telehealth: Payer: Self-pay | Admitting: Family Medicine

## 2020-11-22 NOTE — Chronic Care Management (AMB) (Signed)
  Chronic Care Management   Note  11/22/2020 Name: Jaken Fregia MRN: [id number] DOB: 1945-12-09  Fabian Walder is a 75 y.o. year old male who is a primary care patient of Marin Olp, MD. I reached out to [name] by phone today in response to a referral sent by Mr. Zenia Resides Garlick's PCP, Marin Olp, MD.   Mr. Seever was given information about Chronic Care Management services today including:  1. CCM service includes personalized support from designated clinical staff supervised by his physician, including individualized plan of care and coordination with other care providers 2. 24/7 contact phone numbers for assistance for urgent and routine care needs. 3. Service will only be billed when office clinical staff spend 20 minutes or more in a month to coordinate care. 4. Only one practitioner may furnish and bill the service in a calendar month. 5. The patient may stop CCM services at any time (effective at the end of the month) by phone call to the office staff.   Patient agreed to services and verbal consent obtained.   Follow up plan:   Lauretta Grill Upstream Scheduler

## 2020-11-27 ENCOUNTER — Ambulatory Visit (INDEPENDENT_AMBULATORY_CARE_PROVIDER_SITE_OTHER): Payer: Medicare HMO

## 2020-11-27 ENCOUNTER — Ambulatory Visit: Payer: Medicare HMO

## 2020-11-27 ENCOUNTER — Other Ambulatory Visit: Payer: Self-pay

## 2020-11-27 ENCOUNTER — Telehealth: Payer: Self-pay

## 2020-11-27 DIAGNOSIS — E785 Hyperlipidemia, unspecified: Secondary | ICD-10-CM

## 2020-11-27 DIAGNOSIS — I10 Essential (primary) hypertension: Secondary | ICD-10-CM

## 2020-11-27 DIAGNOSIS — M10079 Idiopathic gout, unspecified ankle and foot: Secondary | ICD-10-CM

## 2020-11-27 NOTE — Patient Instructions (Incomplete)
Mr. Fick,  Thank you for taking the time to review your medications with me today.  I have included our care plan/goals in the following pages. Please review and call me at [phone] with any questions!  Thanks! Ellin Mayhew, Pharm.D., BCGP Clinical Pharmacist [facility] [place] [phone] There are no care plans to display for this patient.    The patient verbalized understanding of instructions provided today and agreed to receive a *** copy of patient instruction and/or educational materials. Telephone follow up appointment with pharmacy team member scheduled for: See next appointment with "Care Management Staff" under "What's Next" below.

## 2020-11-27 NOTE — Progress Notes (Signed)
Chronic Care Management Pharmacy Note  11/28/2020 Name:  Johnathan Sosa MRN:  [id number] DOB:  Nov 02, 1945  Subjective: Johnathan Sosa is an 75 y.o. year old male who is a primary patient of Hunter, Brayton Mars, MD.  The CCM team was consulted for assistance with disease management and care coordination needs.    Engaged with patient face to face for initial visit in response to provider referral for pharmacy case management and/or care coordination services.   Consent to Services:  The patient was given the following information about Chronic Care Management services today, agreed to services, and gave verbal consent: 1. CCM service includes personalized support from designated clinical staff supervised by the primary care provider, including individualized plan of care and coordination with other care providers 2. 24/7 contact phone numbers for assistance for urgent and routine care needs. 3. Service will only be billed when office clinical staff spend 20 minutes or more in a month to coordinate care. 4. Only one practitioner may furnish and bill the service in a calendar month. 5.The patient may stop CCM services at any time (effective at the end of the month) by phone call to the office staff. 6. The patient will be responsible for cost sharing (co-pay) of up to 20% of the service fee (after annual deductible is met). Patient agreed to services and consent obtained.  Patient Care Team: Marin Olp, MD as PCP - General (Family Medicine) Ladene Artist, MD as Consulting Physician (Gastroenterology) Britt Bottom, MD as Consulting Physician (Neurology) Raynelle Bring, MD as Consulting Physician (Urology) Wyatt Portela, MD as Consulting Physician (Oncology) [facility] as Consulting Physician Madelin Rear, [facility] as Pharmacist (Pharmacist)  Objective:  Lab Results  Component Value Date   CREATININE 1.14 08/05/2020   CREATININE 1.15 01/24/2020   CREATININE 1.08  07/20/2019    Lab Results  Component Value Date   HGBA1C 5.3 07/30/2020   Last diabetic Eye exam: No results found for: HMDIABEYEEXA  Last diabetic Foot exam: No results found for: HMDIABFOOTEX      Component Value Date/Time   CHOL 191 01/24/2020 1005   TRIG 208.0 (H) 01/24/2020 1005   HDL 35.90 (L) 01/24/2020 1005   CHOLHDL 5 01/24/2020 1005   VLDL 41.6 (H) 01/24/2020 1005   LDLCALC 71 10/08/2017 1124   LDLDIRECT 110.0 01/24/2020 1005    Hepatic Function Latest Ref Rng & Units 08/05/2020 01/24/2020 07/20/2019  Total Protein 6.0 - 8.5 g/dL 6.7 6.1 6.0  Albumin 3.7 - 4.7 g/dL 4.5 4.2 4.1  AST 0 - 40 IU/L 18 18 19   ALT 0 - 44 IU/L 23 28 35  Alk Phosphatase 44 - 121 IU/L 63 51 51  Total Bilirubin 0.0 - 1.2 mg/dL 0.6 0.7 0.7  Bilirubin, Direct <=0.2 mg/dL - - -    Lab Results  Component Value Date/Time   TSH 1.660 08/05/2020 03:48 PM   TSH 2.20 01/24/2020 10:05 AM   FREET4 0.73 02/26/2017 10:54 AM    CBC Latest Ref Rng & Units 08/05/2020 01/24/2020 07/20/2019  WBC 3.4 - 10.8 x10E3/uL 7.8 5.9 6.9  Hemoglobin 13.0 - 17.7 g/dL 15.8 13.6 13.8  Hematocrit 37.5 - 51.0 % 44.3 39.4 38.7(L)  Platelets 150 - 450 x10E3/uL 242 214.0 218.0    Lab Results  Component Value Date/Time   VD25OH 20.85 (L) 01/24/2020 10:05 AM   VD25OH 22.30 (L) 02/26/2017 10:54 AM    Clinical ASCVD:  The 10-year ASCVD risk score Mikey Bussing DC Brooke Bonito., et  al., 2013) is: 24.4%   Values used to calculate the score:     Age: 30 years     Sex: Male     Is Non-Hispanic African American: No     Diabetic: No     Tobacco smoker: No     Systolic Blood Pressure: 749 mmHg     Is BP treated: Yes     HDL Cholesterol: 35.9 mg/dL     Total Cholesterol: 191 mg/dL    Social History   Tobacco Use  Smoking Status Former Smoker  . Packs/day: 2.50  . Years: 23.00  . Pack years: 57.50  . Types: Cigarettes  . Quit date: 08/31/1980  . Years since quitting: 40.2  Smokeless Tobacco Never Used  Tobacco Comment   Quit in  1982   BP Readings from Last 3 Encounters:  08/19/20 122/70  08/05/20 123/82  07/30/20 130/82   Pulse Readings from Last 3 Encounters:  08/19/20 88  08/05/20 78  07/30/20 83   Wt Readings from Last 3 Encounters:  08/19/20 199 lb (90.3 kg)  08/05/20 202 lb (91.6 kg)  07/30/20 202 lb 9.6 oz (91.9 kg)    Assessment: Review of patient past medical history, allergies, medications, health status, including review of consultants reports, laboratory and other test data, was performed as part of comprehensive evaluation and provision of chronic care management services.   SDOH:  (Social Determinants of Health) assessments and interventions performed: Yes   CCM Care Plan  Allergies  Allergen Reactions  . Black Pepper [[place]]     redness of skin, profuse sweating  . Ritalin [Methylphenidate Hcl] Other (See Comments)    Joint's ache.  . Sulfa Antibiotics Itching and Rash    Medications Reviewed Today    Reviewed by Madelin Rear, [facility] (Pharmacist) on 11/27/20 at 1748  Med List Status: <None>  Medication Order Taking? Sig Documenting Provider Last Dose Status Informant  allopurinol (ZYLOPRIM) 300 MG tablet [id number]  TAKE 1 TABLET EVERY DAY Marin Olp, MD  Active   ALPRAZolam Duanne Moron) 0.25 MG tablet [id number]  TAKE 1 TABLET BY MOUTH TWICE DAILY AS NEEDED FOR VERTIGO. DO NOT EXCEED 2 TABS DAILY. Second dose not to be taken after 3PM if needed. [name], Asencion Partridge, MD  Active   amLODipine (NORVASC) 2.5 MG tablet [id number]  Take 1 tablet (2.5 mg total) by mouth daily. Marin Olp, MD  Active   buPROPion (WELLBUTRIN XL) 300 MG 24 hr tablet [id number]  TAKE 1 TABLET EVERY DAY Marin Olp, MD  Active   citalopram (CELEXA) 10 MG tablet [id number]  TAKE 1 TABLET (10 MG TOTAL) BY MOUTH DAILY. PER DR. [name] PER PATIENT [name], Asencion Partridge, MD  Active   clonazePAM (KLONOPIN) 1 MG tablet [id number] Yes TAKE 1 TABLET BY MOUTH EVERY DAY AT BEDTIME AS NEEDED. May fill today and monthly  thereafter [name], Asencion Partridge, MD Taking Active   rosuvastatin (CRESTOR) 10 MG tablet [id number]  Take 1 tablet (10 mg total) by mouth daily. Marin Olp, MD  Active   vitamin B-12 (CYANOCOBALAMIN) 1000 MCG tablet [id number] Yes Take 2,000 mcg by mouth daily. [provider] Taking Active   VITAMIN D PO [id number] Yes Take 800 Units by mouth daily in the afternoon. [provider] Taking Active           Patient Active Problem List   Diagnosis Date Noted  . Ataxia 08/05/2020  . 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 Vibration sensory loss 08/05/2020  . Vitamin B12 deficiency  01/25/2020  . Vitamin D deficiency 01/25/2020  . Recurrent falls while walking 02/06/2019  . MCI (mild cognitive impairment) with memory loss 02/06/2019  . Hypersomnia with sleep apnea 02/06/2019  . History of adenomatous polyp of colon 01/25/2019  . Excessive daytime sleepiness 06/07/2018  . MCI (mild cognitive impairment) 06/07/2018  . Fatigue due to depression 06/07/2018  . Aortic atherosclerosis ([place]) 12/21/2017  . Unintentional weight loss 12/21/2017  . Ileus ([place]) 09/04/2017  . Prostate cancer ([place]) 07/13/2017  . 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 BPPV (benign paroxysmal positional vertigo) 12/21/2016  . Screening for prostate cancer 10/16/2016  . History of CVA (cerebrovascular accident) 07/20/2016  . Palpitations 07/20/2016  . Attention deficit hyperactivity disorder (ADHD) 07/06/2016  . Former smoker 10/01/2015  . Erectile dysfunction 10/01/2015  . Migraine headache   . Closed TBI (traumatic brain injury) ([place]) 10/24/2014  . Abnormal eye movements 10/24/2014  . 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 Gout   . Insomnia   . GERD (gastroesophageal reflux disease)   . Hyperlipidemia   . Depression, major, single episode, in partial remission ([place])   . Memory loss   . HTN (hypertension)  04/29/2012    Immunization History  Administered Date(s) Administered  . Fluad Quad(high Dose 65+) 05/10/2019  . Influenza, High Dose Seasonal PF 06/26/2016, 06/02/2017, 05/16/2018  . Influenza-Unspecified 06/12/2015, 06/08/2020  . PFIZER(Purple Top)SARS-COV-2 Vaccination 11/02/2019, 11/28/2019, 06/08/2020  . Pneumococcal Conjugate-13 10/01/2015  . Pneumococcal Polysaccharide-23 10/16/2016  . Td 02/05/2015  . Zoster 03/08/2013    Conditions to be addressed/monitored: HTN HLD GERD Gout CVA Hx Aortic Atherosclerosis  Care Plan : [place]  Updates made by Madelin Rear, [facility] since 11/28/2020 12:00 AM    Problem: HTN HLD GERD Gout CVA Hx Aortic Atherosclerosis   Priority: High  Note:   Current Barriers:  . Unable to achieve control of HLD  . Noncompliance due to difficultly with pharmacy   Pharmacist Clinical Goal(s):  [name] Kitchen Patient will achieve adherence to monitoring guidelines and medication adherence to achieve therapeutic efficacy . contact provider office for questions/concerns as evidenced notation of same in electronic health record through collaboration with PharmD and provider.   Interventions: . 1:1 collaboration with Marin Olp, MD regarding development and update of comprehensive plan of care as evidenced by provider attestation and co-signature . Inter-disciplinary care team collaboration (see longitudinal plan of care) . Comprehensive medication review performed; medication list updated in electronic medical record . Updates and potential changes o Frustrations/misunderstanding with pharmacies led to this disruption in medication management - medications sent/transferred to four different pharmacies over past year. There seems to be quite a few gaps in his fill hx. Patient is agreeable to having me help manage his medications through upstream pharmacy. I am going to try to confirm what he has at home to avoid potential surplus before moving  forward. o Wants to know if PCP would also be willing to prescribe alprazolam (states he has been out, not recently filled per pharmacies/PDMP Hx)  Medication review of fill history ([facility], [facility], [place], [facility], [place] PDMP registry): -Allopurinol 300 mg once daily (no fills in 2022, did not bring to appt) -Amlodipine 2.5 mg once daily ([facility], brought empty bottle from last year to appointment) -Alprazolam 0.25 mg twice daily (no fills in 2022, presented with empty bottle) -Bupropion XL 300 mg once daily - 1 tablet  every morning (no fills in 2022, presented with empty bottle) -Citalopram 10 mg once daily ([facility], did not bring) -Clonazepam 1 mg tablet (11/20/2020 31 days supply. [facility]irmed [facility]t [facility] and PDMP) -Rosuvastatin 10 mg once daily (no fills in 2022, presented with empty bottle of atorvastatin rather than rosuvastatin)  Patient Goals/Self-Care Activities . Patient will:  - collaborate with provider on medication access solutions  Follow Up Plan: Confirm recently picked up/delivered medications with [place] and [facility] and help coordinate single pharmacy to manage medications. [facility] has been declined due to not knowing when to expect order/delivery and concern for medications getting stolen.  Hypertension (BP goal <130/80)  Some confusion on amlodipine being used for gout rather than BP. Reports being out of most medications due to [facility]/not helping him refill meds. [facility] then [facility] however they are not a preferred pharmacy through insurance and most recently has tried to get medications filled at [facility]. There have also been attempts to use [facility]. Reports being out of almost every medication due these mix ups. Does mention some medications have been recently refilled but is unsure which ones and presents with empty bottles.    -Controlled,-Consistently <130/80 in office  -Current treatment: . Amlodipine 2.5 mg once daily ([facility], brought empty bottle from last year to appointment) -Current home readings: does not check, is going to ask daughter who is physical therapist to help him find a cuff to use at home  -Current dietary habits: jimmy dean egg buscuit w/ coffee. Prunes, bananas, grapes. Milk, water. Has concern that he may not be getting enough calories. -Current exercise habits: wanting to get back to exercising but has not exercised any time recently[facility] -Denies hypotensive/hypertensive symptoms -Educated on Daily salt intake goal < 2300 mg; Recommended to continue current medication  Hyperlipidemia: (LDL goal < 70)  Feels there are too many risks associated with cholesterol medications based on his research. Also feels the same about aspirin 81 mg. Does not identify any specific concerns. Father had high cholesterol and didn't take any medications and lived to be 75+ years old. Denies family hx of stroke or heart attack. Not willing to resume therapy with rosuvastatin at this time.  -Not ideally controlled -CVA hx, HTN,  -Current treatment:  Rosuvastatin 10 mg once daily (no fills in 2022, presented with empty bottle of old atorvastatin Rx rather than rosuvastatin) -Medications previously tried: atorvastatin 10 mg and 20 mg -Educated on Cholesterol goals;  Benefits of statin for ASCVD risk reduction; -Counseled on ASCVD risk and statin/ASA use and role in secondary prevention over primary -Pt agreeable to restarting aspirin 81 mg, wants to think more about rosuvastatin  Depression/Insomnia(Goal: minimize symptoms optimize medication safety)  Due to pharmacy issues has been off of citalopram and bupropion Declines wanting to start back on citalopram. Feels strongly about wanting to restart bupropion. Feels that his sleep patterns are  irregular at times but is typically well rested. Requesting for all medications to be managed by one practice.  -Family Hx: mentions borderline personality disorder + bipolar disorder in one of his daughters -Controlled per phq (2) despite him possibly being off of citalopram and bupropion  -Has been using alprazolam and clonazepam together for several years although not always consistently. When using together, each benzodiazepines typically minimized to once daily at separate times of day.  -  Use of benzodiazepines associated with vertigo-related diagnoses.  -Pt sees neurologist Dr Brett Fairy every six months -ADHD noted in med hx 2017 -Current treatment:  Alprazolam 0.25 mg twice daily (no fills in 2022, presented with empty bottle)  Bupropion XL 300 mg once daily - 1 tablet every morning (no fills in 2022, presented with empty bottle)  Citalopram 10 mg once daily ([facility], did not [facility])  Clonazepam 1 mg tablet (11/20/2020 31 days supply at [facility] and PDMP) -After confirming home meds, might consider restarting bupropion XL (24 hour) 150 mg x 4-7 days then switch to 300 mg tablet once daily  Gout (Goal: minimize symptoms, ensure medication safety)  Last gout attack was estimated early 2000s while out sailing and suffering from dehydration. Had been under the impression amlodipine was used for gout. Has not had any recent issues with gout. -Controlled -Does not drink alcohol, some processed meat and sea food in diet. -Current treatment   Allopurinol 300 mg once daily (no fills in 2022, did not bring to appt) -Medications previously tried: n/a  -After home meds confirmed, may need to consider restarting at initial doses  Medication Assistance:   Potentially transition to upstream pharmacy.  [facility]'s [facility] is: [facility] Follow Up:  Patient agrees to Care Plan and Follow-up. Plan: Pharmacy team to reach out to patient to confirm what  is being taken at home due recent fill dates yet pt presenting with empty bottles.   Goal: Disease Management   Start Date: 11/28/2020  Expected End Date: 11/28/2021  This Visit's Progress: On track  Priority: High  Note:   Conditions to be addressed/monitored: HTN HLD GERD Gout CVA Hx Aortic Atherosclerosis  There are no care plans that you recently modified to display for this patient. Current Barriers:  . Unable to achieve control of HLD  . Noncompliance due to difficultly with pharmacy   Pharmacist Clinical Goal(s):  [name] Kitchen Patient will achieve adherence to monitoring guidelines and medication adherence to achieve therapeutic efficacy . contact provider office for questions/concerns as evidenced notation of same in electronic health record through collaboration with PharmD and provider.   Interventions: . 1:1 collaboration with Marin Olp, MD regarding development and update of comprehensive plan of care as evidenced by provider attestation and co-signature . Inter-disciplinary care team collaboration (see longitudinal plan of care) . Comprehensive medication review performed; medication list updated in electronic medical record . Updates and potential changes o Frustrations/misunderstanding with pharmacies led to this disruption in medication management - medications sent/transferred to four different pharmacies over past year. There seems to be quite a few gaps in his fill hx. Patient is agreeable to having me help manage his medications through upstream pharmacy. I am going to try to confirm what he has at home to avoid potential surplus before moving forward. o Wants to know if PCP would also be willing to prescribe alprazolam (states he has been out, not recently filled per pharmacies/PDMP Hx)  Medication review of fill history ([facility], [place], [facility], [place] PDMP registry): -Allopurinol 300 mg once daily (no fills in 2022, did not bring to appt) -Amlodipine  2.5 mg once daily (10[facility]05/2020 [facility], brought empty bottle from last year to appointment) -Alprazolam 0.25 mg twice daily (no fills in 2022, presented with empty bottle) -Bupropion XL 300 mg once daily - 1 tablet every morning (no fills in 2022, presented with empty bottle) -Citalopram 10 mg once daily (10/05/2020 90  DS at [facility], did not bring) -Clonazepam 1 mg tablet (11/20/2020 31 days supply. [facility] and PD[facility]) -Rosuvastatin 10 mg once daily (no fills in 2022, presented with empty bottle of atorvastatin rather than rosuvastatin)  Patient Goals/Self-Care Activities . Patient will:  - collaborate with provider on medication access solutions  Follow Up Plan: Confirm recently picked up/delivered medications with [place] and help coordinate single pharmacy to manage medications. [facility] has been declined due to not knowing when to expect order/delivery and concern for medications getting stolen.  Hypertension (BP goal <130/80)  Some confusion on amlodipine being used for gout rather than BP. Reports being out of most medications due to [facility]/not helping him refill meds. [facility] are not a preferred [facility] through insurance and most recently has tried to get medications filled at [facility]. There have also been attempts to use [facility]. Reports being out of almost every medication due these mix ups. Does mention some medications have been recently refilled but is unsure which ones and presents with empty bottles.   -Controlled,-Consistently <130/80 in office  -Current treatment: . Amlodipine 2.5 mg once daily ([facility], brought empty bottle from last year to appointment) -Current home readings: does not check, is going to ask daughter who is physical therapist to help him find a cuff to use at home  -Current dietary habits: jimmy dean egg  buscuit w/ coffee. Prunes, bananas, grapes. Milk, water. Has concern that he may not be getting enough calories. -Current exercise habits: wanting to get back to exercising but has not exercised any time recently. [facility] -Denies hypotensive/hypertensive symptoms -Educated on Daily salt intake goal < 2300 mg; Recommended to continue current medication  Hyperlipidemia: (LDL goal < 70)  Feels there are too many risks associated with cholesterol medications based on his research. Also feels the same about aspirin 81 mg. Does not identify any specific concerns. Father had high cholesterol and didn't take any medications and lived to be 13+ years old. Denies family hx of stroke or heart attack. Not willing to resume therapy with rosuvastatin at this time.  -Not ideally controlled -CVA hx, HTN,  -Current treatment:  Rosuvastatin 10 mg once daily (no fills in 2022, presented with empty bottle of old atorvastatin Rx rather than rosuvastatin) -Medications previously tried: atorvastatin 10 mg and 20 mg -Educated on Cholesterol goals;  Benefits of statin for ASCVD risk reduction; -Counseled on ASCVD risk and statin/ASA use and role in secondary prevention over primary -Pt agreeable to restarting aspirin 81 mg, wants to think more about rosuvastatin  Depression/Insomnia(Goal: minimize symptoms optimize medication safety)  Due to pharmacy issues has been off of citalopram and bupropion Declines wanting to start back on citalopram. Feels strongly about wanting to restart bupropion. Feels that his sleep patterns are irregular at times but is typically well rested. Requesting for all medications to be managed by one practice.  -Family Hx: mentions borderline personality disorder + bipolar disorder in one of his daughters -Controlled per phq (2) despite him possibly being off of citalopram and bupropion  -Has been using alprazolam and clonazepam together for several  years although not always consistently. When using together, each benzodiazepines typically minimized to once daily at separate times of day.  -Use of benzodiazepines associated with vertigo-related diagnoses.  -Pt sees neurologist Dr Brett Fairy every six months -  ADHD noted in med hx 2017 -Current treatment:  Alprazolam 0.25 mg twice daily (no fills in 2022, presented with empty bottle)  Bupropion XL 300 mg once daily - 1 tablet every morning (no fills in 2022, presented with empty bottle)  Citalopram 10 mg once daily ([facility], [facility])  Clonazepam 1 mg tablet (11/20/2020 31 days supply at [facility] and PDMP) -After confirming home meds, might consider restarting bupropion XL (24 hour) 150 mg x 4-7 days then switch to 300 mg tablet once daily  Gout (Goal: minimize symptoms, ensure medication safety)  Last gout attack was estimated early 2000s while out sailing and suffering from dehydration. Had been under the impression amlodipine was used for gout. Has not had any recent issues with gout. -Controlled -Does not drink alcohol, some processed meat and sea food in diet. -Current treatment   Allopurinol 300 mg once daily (no fills in 2022, did not bring to appt) -Medications previously tried: n/a  -After home meds confirmed, may need to consider restarting at initial doses  Medication Assistance:   Potentially transition to upstream pharmacy.  Patient's preferred pharmacy is: [facility] Follow Up:  Patient agrees to Care Plan and Follow-up. Plan: Pharmacy team to reach out to patient to confirm what is being taken at home due recent fill dates yet pt presenting with empty bottles.    Future Appointments  Date Time Provider [place]  12/24/2020 10:30 AM [name], Asencion Partridge, MD [facility] None  02/18/2021  2:20 PM Marin Olp, MD [facility]   Madelin Rear, Pharm.D., BCGP Clinical Pharmacist [place] [phone]
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
## 2020-11-27 NOTE — Progress Notes (Deleted)
Chronic Care Management Pharmacy Note  11/27/2020 Name:  Johnathan Sosa MRN:  [id number] DOB:  Oct 11, 1945  Subjective: Johnathan Sosa is an 75 y.o. year old male who is a primary patient of Hunter, Brayton Mars, MD.  The CCM team was consulted for assistance with disease management and care coordination needs.    {CCMTELEPHONEFACETOFACE:21091510} for {CCMINITIALFOLLOWUPCHOICE:21091511} in response to provider referral for pharmacy case management and/or care coordination services.   Consent to Services:  The patient was given the following information about Chronic Care Management services today, agreed to services, and gave verbal consent: 1. CCM service includes personalized support from designated clinical staff supervised by the primary care provider, including individualized plan of care and coordination with other care providers 2. 24/7 contact phone numbers for assistance for urgent and routine care needs. 3. Service will only be billed when office clinical staff spend 20 minutes or more in a month to coordinate care. 4. Only one practitioner may furnish and bill the service in a calendar month. 5.The patient may stop CCM services at any time (effective at the end of the month) by phone call to the office staff. 6. The patient will be responsible for cost sharing (co-pay) of up to 20% of the service fee (after annual deductible is met). Patient agreed to services and consent obtained.  Patient Care Team: Marin Olp, MD as PCP - General (Family Medicine) Ladene Artist, MD as Consulting Physician (Gastroenterology) Britt Bottom, MD as Consulting Physician (Neurology) Raynelle Bring, MD as Consulting Physician (Urology) Wyatt Portela, MD as Consulting Physician (Oncology) [facility] as Consulting Physician Madelin Rear, [facility] as Pharmacist (Pharmacist)  Recent office visits: 01/25/2020 (PCP): atorvastatin 10 mg stopped, rosuvastatin 10 mg started due to  LDL>100  Recent consult visits: [facility] visits: {Hospital DC Yes/No:21091515}  Objective:  Lab Results  Component Value Date   CREATININE 1.14 08/05/2020   CREATININE 1.15 01/24/2020   CREATININE 1.08 07/20/2019    Lab Results  Component Value Date   HGBA1C 5.3 07/30/2020   Last diabetic Eye exam: No results found for: HMDIABEYEEXA  Last diabetic Foot exam: No results found for: HMDIABFOOTEX      Component Value Date/Time   CHOL 191 01/24/2020 1005   TRIG 208.0 (H) 01/24/2020 1005   HDL 35.90 (L) 01/24/2020 1005   CHOLHDL 5 01/24/2020 1005   VLDL 41.6 (H) 01/24/2020 1005   LDLCALC 71 10/08/2017 1124   LDLDIRECT 110.0 01/24/2020 1005    Hepatic Function Latest Ref Rng & Units 08/05/2020 01/24/2020 07/20/2019  Total Protein 6.0 - 8.5 g/dL 6.7 6.1 6.0  Albumin 3.7 - 4.7 g/dL 4.5 4.2 4.1  AST 0 - 40 IU/L 18 18 19   ALT 0 - 44 IU/L 23 28 35  Alk Phosphatase 44 - 121 IU/L 63 51 51  Total Bilirubin 0.0 - 1.2 mg/dL 0.6 0.7 0.7  Bilirubin, Direct <=0.2 mg/dL - - -    Lab Results  Component Value Date/Time   TSH 1.660 08/05/2020 03:48 PM   TSH 2.20 01/24/2020 10:05 AM   FREET4 0.73 02/26/2017 10:54 AM    CBC Latest Ref Rng & Units 08/05/2020 01/24/2020 07/20/2019  WBC 3.4 - 10.8 x10E3/uL 7.8 5.9 6.9  Hemoglobin 13.0 - 17.7 g/dL 15.8 13.6 13.8  Hematocrit 37.5 - 51.0 % 44.3 39.4 38.7(L)  Platelets 150 - 450 x10E3/uL 242 214.0 218.0    Lab Results  Component Value Date/Time   VD25OH 20.85 (L) 01/24/2020 10:05 AM  VD25OH 22.30 (L) 02/26/2017 10:54 AM    Clinical ASCVD:  The 10-year ASCVD risk score Mikey Bussing DC Jr., et al., 2013) is: 24.4%   Values used to calculate the score:     Age: 64 years     Sex: Male     Is Non-Hispanic African American: No     Diabetic: No     Tobacco smoker: No     Systolic Blood Pressure: 465 mmHg     Is BP treated: Yes     HDL Cholesterol: 35.9 mg/dL     Total Cholesterol: 191 mg/dL    Other: (CHADS2VASc if Afib, PHQ9 if  depression, MMRC or CAT for COPD, ACT, DEXA)  Social History   Tobacco Use  Smoking Status Former Smoker  . Packs/day: 2.50  . Years: 23.00  . Pack years: 57.50  . Types: Cigarettes  . Quit date: 08/31/1980  . Years since quitting: 40.2  Smokeless Tobacco Never Used  Tobacco Comment   Quit in 1982   BP Readings from Last 3 Encounters:  08/19/20 122/70  08/05/20 123/82  07/30/20 130/82   Pulse Readings from Last 3 Encounters:  08/19/20 88  08/05/20 78  07/30/20 83   Wt Readings from Last 3 Encounters:  08/19/20 199 lb (90.3 kg)  08/05/20 202 lb (91.6 kg)  07/30/20 202 lb 9.6 oz (91.9 kg)   Assessment: Review of patient past medical history, allergies, medications, health status, including review of consultants reports, laboratory and other test data, was performed as part of comprehensive evaluation and provision of chronic care management services.   SDOH:  (Social Determinants of Health) assessments and interventions performed: ***   CCM Care Plan  Allergies  Allergen Reactions  . Black Pepper [[place]]     redness of skin, profuse sweating  . Ritalin [Methylphenidate Hcl] Other (See Comments)    Joint's ache.  . Sulfa Antibiotics Itching and Rash    Medications Reviewed Today    Reviewed by Thomes Cake, CMA (Certified Medical Assistant) on 08/19/20 at 1516  Med List Status: <None>  Medication Order Taking? Sig Documenting Provider Last Dose Status Informant  allopurinol (ZYLOPRIM) 300 MG tablet [id number] Yes TAKE 1 TABLET EVERY DAY Marin Olp, MD Taking Active   ALPRAZolam Duanne Moron) 0.25 MG tablet [id number] Yes TAKE 1 TABLET BY MOUTH TWICE DAILY AS NEEDED FOR VERTIGO. DO NOT EXCEED 2 TABS DAILY. Second dose not to be taken after 3PM if needed. [name], Asencion Partridge, MD Taking Active   amLODipine (NORVASC) 2.5 MG tablet [id number] Yes Take 1 tablet (2.5 mg total) by mouth daily. Marin Olp, MD Taking Active   buPROPion (WELLBUTRIN XL) 300 MG 24 hr tablet  [id number] Yes TAKE 1 TABLET EVERY DAY Marin Olp, MD Taking Active   citalopram (CELEXA) 10 MG tablet [id number] Yes TAKE 1 TABLET (10 MG TOTAL) BY MOUTH DAILY. PER DR. [name] PER PATIENT [name], Asencion Partridge, MD Taking Active   clonazePAM (KLONOPIN) 1 MG tablet [id number] Yes TAKE 1 TABLET BY MOUTH EVERY DAY AT BEDTIME AS NEEDED. May fill today and monthly thereafter [name], Asencion Partridge, MD Taking Active   rosuvastatin (CRESTOR) 10 MG tablet [id number] Yes Take 1 tablet (10 mg total) by mouth daily. Marin Olp, MD Taking Active   vitamin B-12 (CYANOCOBALAMIN) 1000 MCG tablet [id number] Yes Take 2,000 mcg by mouth daily. [provider] Taking Active   VITAMIN D PO [id number] Yes Take 800 Units by mouth daily in the afternoon. [provider] Taking Active  Patient Active Problem List   Diagnosis Date Noted  . Ataxia 08/05/2020  . 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 Vibration sensory loss 08/05/2020  . Vitamin B12 deficiency 01/25/2020  . Vitamin D deficiency 01/25/2020  . Recurrent falls while walking 02/06/2019  . MCI (mild cognitive impairment) with memory loss 02/06/2019  . Hypersomnia with sleep apnea 02/06/2019  . History of adenomatous polyp of colon 01/25/2019  . Excessive daytime sleepiness 06/07/2018  . MCI (mild cognitive impairment) 06/07/2018  . Fatigue due to depression 06/07/2018  . Aortic atherosclerosis ([place]) 12/21/2017  . Unintentional weight loss 12/21/2017  . Ileus ([place]) 09/04/2017  . Prostate cancer ([place]) 07/13/2017  . 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 BPPV (benign paroxysmal positional vertigo) 12/21/2016  . Screening for prostate cancer 10/16/2016  . History of CVA (cerebrovascular accident) 07/20/2016  . Palpitations 07/20/2016  . Attention deficit hyperactivity disorder (ADHD) 07/06/2016  . Former smoker 10/01/2015  . Erectile dysfunction 10/01/2015  . Migraine headache   . Closed TBI (traumatic brain  injury) ([place]) 10/24/2014  . Abnormal eye movements 10/24/2014  . 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 Gout   . Insomnia   . GERD (gastroesophageal reflux disease)   . Hyperlipidemia   . Depression, major, single episode, in partial remission ([place])   . Memory loss   . HTN (hypertension) 04/29/2012    Immunization History  Administered Date(s) Administered  . Fluad Quad(high Dose 65+) 05/10/2019  . Influenza, High Dose Seasonal PF 06/26/2016, 06/02/2017, 05/16/2018  . Influenza-Unspecified 06/12/2015, 06/08/2020  . PFIZER(Purple Top)SARS-COV-2 Vaccination 11/02/2019, 11/28/2019, 06/08/2020  . Pneumococcal Conjugate-13 10/01/2015  . Pneumococcal Polysaccharide-23 10/16/2016  . Td 02/05/2015  . Zoster 03/08/2013   Conditions to be addressed/monitored: HTN HLD GERD Gout CVA Hx Aortic Atherosclerosis  There are no care plans that you recently modified to display for this patient.   Medication Assistance: {MEDASSISTANCEINFO:25044}  Patient's preferred pharmacy is:  [facility] - [place], [address] [address] Phone: [phone] Fax: [phone]  [facility] [place], [place] - [address] AT [facility] [place] [address] Phone: [phone] Fax: [phone]  Uses pill box? {Yes or If no, why not?:20788} Pt endorses ***% compliance  Follow Up:  {FOLLOWUP:24991} Plan: {CM FOLLOW UP GQQP:61950}   Future Appointments  Date Time Provider [place]  11/27/2020  2:00 PM [facility] CCM PHARMACIST [facility]  12/24/2020 10:30 AM [name], Asencion Partridge, MD [facility] None  02/18/2021  2:20 PM Marin Olp, MD [facility]   Madelin Rear, Pharm.D., BCGP Clinical Pharmacist [place] [phone]

## 2020-11-28 NOTE — Patient Instructions (Addendum)
Johnathan Sosa,  Thank you for taking the time to review your medications with me today.  I have included our care plan/goals in the following pages. Please review and call me at [phone] with any questions!  Thanks! Johnathan Sosa, Pharm.D., BCGP Clinical Pharmacist [facility] [place] [phone] Patient Care Plan: [place] Plan    Problem Identified: HTN HLD GERD Gout CVA Hx Aortic Atherosclerosis   Priority: High  Note:   Current Barriers:  . Unable to achieve control of HLD  . Noncompliance due to difficultly with pharmacy   Pharmacist Clinical Goal(s):  [name] Kitchen Patient will achieve adherence to monitoring guidelines and medication adherence to achieve therapeutic efficacy . contact provider office for questions/concerns as evidenced notation of same in electronic health record through collaboration with PharmD and provider.   Interventions: . 1:1 collaboration with Marin Olp, MD regarding development and update of comprehensive plan of care as evidenced by provider attestation and co-signature . Inter-disciplinary care team collaboration (see longitudinal plan of care) . Comprehensive medication review performed; medication list updated in electronic medical record . Updates and potential changes o Frustrations/misunderstanding with pharmacies led to this disruption in medication management - medications sent/transferred to four different pharmacies over past year. There seems to be quite a few gaps in his fill hx. Patient is agreeable to having me help manage his medications through upstream pharmacy. I am going to try to confirm what he has at home to avoid potential surplus before moving forward. o Wants to know if PCP would also be willing to prescribe alprazolam (states he has been out, not recently filled per pharmacies/PDMP Hx)  Medication review of fill history ([facility], [facility], [facility], [facility],  [place] PDMP registry): -Allopurinol 300 mg once daily (no fills in 2022, did not bring to appt) -Amlodipine 2.5 mg once daily ([facility], brought empty bottle from last year to appointment) -Alprazolam 0.25 mg twice daily (no fills in 2022, presented with empty bottle) -Bupropion XL 300 mg once daily - 1 tablet every morning (no fills in 2022, presented with empty bottle) -Citalopram 10 mg once daily ([facility], did not bring) -Clonazepam 1 mg tablet (11/20/2020 31 days supply. [facility]irmed [facility]t [facility] and PDMP) -Rosuvastatin 10 mg once daily (no fills in 2022, presented with empty bottle of atorvastatin rather than rosuvastatin)  Patient Goals/Self-Care Activities . Patient will:  - collaborate with provider on medication access solutions  Follow Up Plan: Confirm recently picked up/delivered medications with [facility] and [facility] and help coordinate single pharmacy to manage medications. [facility] has been declined due to not knowing when to expect order/delivery and concern for medications getting stolen.  Hypertension (BP goal <130/80)  Some confusion on amlodipine being used for gout rather than BP. Reports being out of most medications due to [facility]/not helping him refill meds. [facility] then [facility] however they are not a preferred pharmacy through insurance and most recently has tried to get medications filled at [facility]. There have also been attempts to use [facility]. Reports being out of almost every medication due these mix ups. Does mention some medications have been recently refilled but is unsure which ones and presents with empty bottles.   -Controlled,-Consistently <130/80 in office  -Current treatment: . Amlodipine 2.5 mg once daily ([facility], brought empty bottle from  last year to appointment) -Current home readings: does not check, is going to ask daughter  who is physical therapist to help him find a cuff to use at home  -Current dietary habits: jimmy dean egg buscuit w/ coffee. Prunes, bananas, grapes. Milk, water. Has concern that he may not be getting enough calories. -Current exercise habits: wanting to get back to exercising but has not exercised any time recently. [facility] -Denies hypotensive/hypertensive symptoms -Educated on Daily salt intake goal < 2300 mg; Recommended to continue current medication  Hyperlipidemia: (LDL goal < 70)  Feels there are too many risks associated with cholesterol medications based on his research. Also feels the same about aspirin 81 mg. Does not identify any specific concerns. Father had high cholesterol and didn't take any medications and lived to be 48+ years old. Denies family hx of stroke or heart attack. Not willing to resume therapy with rosuvastatin at this time.  -Not ideally controlled -CVA hx, HTN,  -Current treatment:  Rosuvastatin 10 mg once daily (no fills in 2022, presented with empty bottle of old atorvastatin Rx rather than rosuvastatin) -Medications previously tried: atorvastatin 10 mg and 20 mg -Educated on Cholesterol goals;  Benefits of statin for ASCVD risk reduction; -Counseled on ASCVD risk and statin/ASA use and role in secondary prevention over primary -Pt agreeable to restarting aspirin 81 mg, wants to think more about rosuvastatin  Depression/Insomnia(Goal: minimize symptoms optimize medication safety)  Due to pharmacy issues has been off of citalopram and bupropion Declines wanting to start back on citalopram. Feels strongly about wanting to restart bupropion. Feels that his sleep patterns are irregular at times but is typically well rested. Requesting for all medications to be managed by one practice.  -Family Hx: mentions borderline personality disorder + bipolar disorder in one of his daughters -Controlled per phq (2) despite him possibly  being off of citalopram and bupropion  -Has been using alprazolam and clonazepam together for several years although not always consistently. When using together, each benzodiazepines typically minimized to once daily at separate times of day.  -Use of benzodiazepines associated with vertigo-related diagnoses.  -Pt sees neurologist Dr Brett Fairy every six months -ADHD noted in med hx 2017 -Current treatment:  Alprazolam 0.25 mg twice daily (no fills in 2022, presented with empty bottle)  Bupropion XL 300 mg once daily - 1 tablet every morning (no fills in 2022, presented with empty bottle)  Citalopram 10 mg once daily ([facility], did not [facility])  Clonazepam 1 mg tablet (11/20/2020 31 days supply at [facility] and PDMP) -After confirming home meds, might consider restarting bupropion XL (24 hour) 150 mg x 4-7 days then switch to 300 mg tablet once daily  Gout (Goal: minimize symptoms, ensure medication safety)  Last gout attack was estimated early 2000s while out sailing and suffering from dehydration. Had been under the impression amlodipine was used for gout. Has not had any recent issues with gout. -Controlled -Does not drink alcohol, some processed meat and sea food in diet. -Current treatment   Allopurinol 300 mg once daily (no fills in 2022, did not bring to appt) -Medications previously tried: n/a  -After home meds confirmed, may need to consider restarting at initial doses  Medication Assistance:   Potentially transition to upstream pharmacy.  [facility] Follow Up:  Patient agrees to Care Plan and Follow-up. Plan: Pharmacy team to reach out to patient to confirm what  is being taken at home due recent fill dates yet pt presenting with empty bottles.   Goal: Disease Management   Start Date: 11/28/2020  Expected End Date: 11/28/2021  This Visit's Progress: On track  Priority: High  Note:   Conditions to be  addressed/monitored: HTN HLD GERD Gout CVA Hx Aortic Atherosclerosis  There are no care plans that you recently modified to display for this patient. Current Barriers:  . Unable to achieve control of HLD  . Noncompliance due to difficultly with pharmacy   Pharmacist Clinical Goal(s):  [name] Kitchen Patient will achieve adherence to monitoring guidelines and medication adherence to achieve therapeutic efficacy . contact provider office for questions/concerns as evidenced notation of same in electronic health record through collaboration with PharmD and provider.   Interventions: . 1:1 collaboration with Marin Olp, MD regarding development and update of comprehensive plan of care as evidenced by provider attestation and co-signature . Inter-disciplinary care team collaboration (see longitudinal plan of care) . Comprehensive medication review performed; medication list updated in electronic medical record . Updates and potential changes o Frustrations/misunderstanding with pharmacies led to this disruption in medication management - medications sent/transferred to four different pharmacies over past year. There seems to be quite a few gaps in his fill hx. Patient is agreeable to having me help manage his medications through upstream pharmacy. I am going to try to confirm what he has at home to avoid potential surplus before moving forward. o Wants to know if PCP would also be willing to prescribe alprazolam (states he has been out, not recently filled per pharmacies/PDMP Hx)  Medication review of fill history ([facility], [place], [facility], [place] PDMP registry): -Allopurinol 300 mg once daily (no fills in 2022, did not bring to appt) -Amlodipine 2.5 mg once daily ([facility], brought empty bottle from last year to appointment) -Alprazolam 0.25 mg twice daily (no fills in 2022, presented with empty bottle) -Bupropion XL 300 mg once daily - 1 tablet every morning (no fills in  2022, presented with empty bottle) -Citalopram 10 mg once daily (10[facility]05/2020 [facility], did not bring) -Clonazepam 1 mg tablet (11/20/2020 31 days supply. [facility] and PDMP) -Rosuvastatin 10 mg once daily (no fills in 2022, presented with empty bottle of atorvastatin rather than rosuvastatin)  Patient Goals/Self-Care Activities . Patient will:  - collaborate with provider on medication access solutions  Follow Up Plan: Confirm recently picked up/delivered medications with [facility] and help coordinate single pharmacy to manage medications. [facility] has been declined due to not knowing when to expect order/delivery and concern for medications getting stolen.  Hypertension (BP goal <130/80)  Some confusion on amlodipine being used for gout rather than BP. Reports being out of most medications [facility]/not helping him refill meds. [facility] are not a preferred [facility] through insurance and most recently has tried to get medications filled at [facility]. There have also been attempts to use [facility]. Reports being out of almost every medication due these mix ups. Does mention some medications have been recently refilled but is unsure which ones and presents with empty bottles.   -Controlled,-Consistently <130/80 in office  -Current treatment: . Amlodipine 2.5 mg once daily ([facility], brought empty bottle from last year to appointment) -Current home readings: does not check, is going to ask daughter who  is physical therapist to help him find a cuff to use at home  -Current dietary habits: jimmy dean egg buscuit w/ coffee. Prunes, bananas, grapes. Milk, water. Has concern that he may not be getting enough calories. -Current exercise habits: wanting to get back to exercising but has not exercised any time recently. [facility] -Denies hypotensive/hypertensive symptoms -Educated on Daily salt intake goal < 2300 mg; Recommended to continue current medication  Hyperlipidemia: (LDL goal < 70)  Feels there are too many risks associated with cholesterol medications based on his research. Also feels the same about aspirin 81 mg. Does not identify any specific concerns. Father had high cholesterol and didn't take any medications and lived to be 55+ years old. Denies family hx of stroke or heart attack. Not willing to resume therapy with rosuvastatin at this time.  -Not ideally controlled -CVA hx, HTN,  -Current treatment:  Rosuvastatin 10 mg once daily (no fills in 2022, presented with empty bottle of old atorvastatin Rx rather than rosuvastatin) -Medications previously tried: atorvastatin 10 mg and 20 mg -Educated on Cholesterol goals;  Benefits of statin for ASCVD risk reduction; -Counseled on ASCVD risk and statin/ASA use and role in secondary prevention over primary -Pt agreeable to restarting aspirin 81 mg, wants to think more about rosuvastatin  Depression/Insomnia(Goal: minimize symptoms optimize medication safety)  Due to pharmacy issues has been off of citalopram and bupropion Declines wanting to start back on citalopram. Feels strongly about wanting to restart bupropion. Feels that his sleep patterns are irregular at times but is typically well rested. Requesting for all medications to be managed by one practice.  -Family Hx: mentions borderline personality disorder + bipolar disorder in one of his daughters -Controlled per phq (2) despite him possibly being off of citalopram and bupropion  -Has been using alprazolam and clonazepam together for several years although not always consistently. When using together, each benzodiazepines typically minimized to once daily at separate times of day.  -Use of benzodiazepines associated with vertigo-related diagnoses.  -Pt sees neurologist Dr Brett Fairy  every six months -ADHD noted in med hx 2017 -Current treatment:  Alprazolam 0.25 mg twice daily (no fills in 2022, presented with empty bottle)  Bupropion XL 300 mg once daily - 1 tablet every morning (no fills in 2022, presented with empty bottle)  Citalopram 10 mg once daily ([facility], did not [facility])  Clonazepam 1 mg tablet (11/20/2020 31 days supply at [facility] and PDMP) -After confirming home meds, might consider restarting bupropion XL (24 hour) 150 mg x 4-7 days then switch to 300 mg tablet once daily  Gout (Goal: minimize symptoms, ensure medication safety)  Last gout attack was estimated early 2000s while out sailing and suffering from dehydration. Had been under the impression amlodipine was used for gout. Has not had any recent issues with gout. -Controlled -Does not drink alcohol, some processed meat and sea food in diet. -Current treatment   Allopurinol 300 mg once daily (no fills in 2022, did not bring to appt) -Medications previously tried: n/a  -After home meds confirmed, may need to consider restarting at initial doses  Medication Assistance:   Potentially transition to upstream pharmacy.  Patient's preferred [facility] is: [facility] Follow Up:  Patient agrees to Care Plan and Follow-up. Plan: Pharmacy team to reach out to patient to confirm what is being taken at home due recent fill dates yet pt presenting with empty bottles.  The patient verbalized understanding of instructions provided today and agreed to receive a mailed copy of patient instruction and/or educational materials. Telephone follow up appointment with pharmacy team member scheduled for: See next appointment with "Care Management Staff" under "What's Next" below.  High Cholesterol  High cholesterol is a condition in which the blood has high levels of a white, waxy substance similar to fat (cholesterol). The liver makes all the cholesterol that the body needs.  The human body needs small amounts of cholesterol to help build cells. A person gets extra or excess cholesterol from the food that he or she eats. The blood carries cholesterol from the liver to the rest of the body. If you have high cholesterol, deposits (plaques) may build up on the walls of your arteries. Arteries are the blood vessels that carry blood away from your heart. These plaques make the arteries narrow and stiff. Cholesterol plaques increase your risk for heart attack and stroke. Work with your health care provider to keep your cholesterol levels in a healthy range. What increases the risk? The following factors may make you more likely to develop this condition:  Eating foods that are high in animal fat (saturated fat) or cholesterol.  Being overweight.  Not getting enough exercise.  A family history of high cholesterol (familial hypercholesterolemia).  Use of tobacco products.  Having diabetes. What are the signs or symptoms? There are no symptoms of this condition. How is this diagnosed? This condition may be diagnosed based on the results of a blood test.  If you are older than 73 years of age, your health care provider may check your cholesterol levels every 4-6 years.  You may be checked more often if you have high cholesterol or other risk factors for heart disease. The blood test for cholesterol measures:  "Bad" cholesterol, or LDL cholesterol. This is the main type of cholesterol that causes heart disease. The desired level is less than 100 mg/dL.  "Good" cholesterol, or HDL cholesterol. HDL helps protect against heart disease by cleaning the arteries and carrying the LDL to the liver for processing. The desired level for HDL is 60 mg/dL or higher.  Triglycerides. These are fats that your body can store or burn for energy. The desired level is less than 150 mg/dL.  Total cholesterol. This measures the total amount of cholesterol in your blood and includes LDL,  HDL, and triglycerides. The desired level is less than 200 mg/dL. How is this treated? This condition may be treated with:  Diet changes. You may be asked to eat foods that have more fiber and less saturated fats or added sugar.  Lifestyle changes. These may include regular exercise, maintaining a healthy weight, and quitting use of tobacco products.  Medicines. These are given when diet and lifestyle changes have not worked. You may be prescribed a statin medicine to help lower your cholesterol levels. Follow these instructions at home: Eating and drinking  Eat a healthy, balanced diet. This diet includes: ? Daily servings of a variety of fresh, frozen, or canned fruits and vegetables. ? Daily servings of whole grain foods that are rich in fiber. ? Foods that are low in saturated fats and trans fats. These include poultry and fish without skin, lean cuts of meat, and low-fat dairy products. ? A variety of fish, especially oily fish that contain omega-3 fatty acids. Aim to eat fish at least 2 times a week.  Avoid foods and drinks that have added sugar.  Use healthy cooking  methods, such as roasting, grilling, broiling, baking, poaching, steaming, and stir-frying. Do not fry your food except for stir-frying.   Lifestyle  Get regular exercise. Aim to exercise for a total of 150 minutes a week. Increase your activity level by doing activities such as gardening, walking, and taking the stairs.  Do not use any products that contain nicotine or tobacco, such as cigarettes, e-cigarettes, and chewing tobacco. If you need help quitting, ask your health care provider.   General instructions  Take over-the-counter and prescription medicines only as told by your health care provider.  Keep all follow-up visits as told by your health care provider. This is important. Where to find more information  American Heart Association: www.heart.org  National Heart, Lung, and Blood Institute:  https://wilson-eaton.com/ Contact a health care provider if:  You have trouble achieving or maintaining a healthy diet or weight.  You are starting an exercise program.  You are unable to stop smoking. Get help right away if:  You have chest pain.  You have trouble breathing.  You have any symptoms of a stroke. "BE FAST" is an easy way to remember the main warning signs of a stroke: ? B - Balance. Signs are dizziness, sudden trouble walking, or loss of balance. ? E - Eyes. Signs are trouble seeing or a sudden change in vision. ? F - Face. Signs are sudden weakness or numbness of the face, or the face or eyelid drooping on one side. ? A - Arms. Signs are weakness or numbness in an arm. This happens suddenly and usually on one side of the body. ? S - Speech. Signs are sudden trouble speaking, slurred speech, or trouble understanding what people say. ? T - Time. Time to call emergency services. Write down what time symptoms started.  You have other signs of a stroke, such as: ? A sudden, severe headache with no known cause. ? Nausea or vomiting. ? Seizure. These symptoms may represent a serious problem that is an emergency. Do not wait to see if the symptoms will go away. Get medical help right away. Call your local emergency services (911 in the [place] Do not drive yourself to the hospital. Summary  Cholesterol plaques increase your risk for heart attack and stroke. Work with your health care provider to keep your cholesterol levels in a healthy range.  Eat a healthy, balanced diet, get regular exercise, and maintain a healthy weight.  Do not use any products that contain nicotine or tobacco, such as cigarettes, e-cigarettes, and chewing tobacco.  Get help right away if you have any symptoms of a stroke. This information is not intended to replace advice given to you by your health care provider. Make sure you discuss any questions you have with your health care provider. Document Revised:  07/17/2019 Document Reviewed: 07/17/2019 Elsevier Patient Education  2021 [employer].

## 2020-12-02 ENCOUNTER — Telehealth: Payer: Self-pay

## 2020-12-02 ENCOUNTER — Other Ambulatory Visit: Payer: Self-pay | Admitting: Neurology

## 2020-12-02 ENCOUNTER — Telehealth: Payer: Self-pay | Admitting: Neurology

## 2020-12-02 DIAGNOSIS — S069XAA Unspecified intracranial injury with loss of consciousness status unknown, initial encounter: Secondary | ICD-10-CM

## 2020-12-02 DIAGNOSIS — R42 Dizziness and giddiness: Secondary | ICD-10-CM

## 2020-12-02 MED ORDER — CITALOPRAM HYDROBROMIDE 10 MG PO TABS
10.0000 mg | ORAL_TABLET | Freq: Every day | ORAL | 3 refills | Status: DC
Start: 2020-12-02 — End: 2022-01-19

## 2020-12-02 NOTE — Chronic Care Management (AMB) (Signed)
    Chronic Care Management Pharmacy Assistant   Name: Jakorian Marengo  MRN: [id number] DOB: 26-Oct-1945  Reason for Encounter: Patient Call   Medications: Outpatient Encounter Medications as of 12/02/2020  Medication Sig  . allopurinol (ZYLOPRIM) 300 MG tablet TAKE 1 TABLET EVERY DAY  . ALPRAZolam (XANAX) 0.25 MG tablet TAKE 1 TABLET BY MOUTH TWICE DAILY AS NEEDED FOR VERTIGO. DO NOT EXCEED 2 TABS DAILY. Second dose not to be taken after 3PM if needed.  [name] Kitchen amLODipine (NORVASC) 2.5 MG tablet Take 1 tablet (2.5 mg total) by mouth daily.  [name] Kitchen buPROPion (WELLBUTRIN XL) 300 MG 24 hr tablet TAKE 1 TABLET EVERY DAY  . citalopram (CELEXA) 10 MG tablet TAKE 1 TABLET (10 MG TOTAL) BY MOUTH DAILY. PER DR. [name] PER PATIENT  . clonazePAM (KLONOPIN) 1 MG tablet TAKE 1 TABLET BY MOUTH EVERY DAY AT BEDTIME AS NEEDED. May fill today and monthly thereafter  . rosuvastatin (CRESTOR) 10 MG tablet Take 1 tablet (10 mg total) by mouth daily.  . vitamin B-12 (CYANOCOBALAMIN) 1000 MCG tablet Take 2,000 mcg by mouth daily.  [name] Kitchen VITAMIN D PO Take 800 Units by mouth daily in the afternoon.   No facility-administered encounter medications on file as of 12/02/2020.    I called and spoke with the patient to confirm if he picked up medications [facility] and [facility] as we have filled dates for that day. Patient states he does not have either one of these medications on hand. He states he is not sure if he picked them up or if he had misplaced them. Patient states he is completely out of all of his medications except for Clonazepam. He states he only has a few weeks left of Clonazepam.  Patient states he needs refills for the follow medications sent to [facility] on [address] in [place], [place]: Allopurinol 300 mg tablet - [facility] to request Rosuvastatin 10 mg tablet - [facility] to request Amlodipine 2.5 mg tablet - [facility] to request Bupropion 300 mg tablet - [facility] to request Citalopram 10 mg tablet -  requested from Jeraldine Loots, MD Alprazolam 0.25 mg tablet - requested from Jeraldine Loots, MD  April D Calhoun, [place] Pharmacist Assistant [phone]

## 2020-12-02 NOTE — Telephone Encounter (Signed)
I have routed this request to Dr [name] for review. The pt is due for the medication and [place] registry was verified.  

## 2020-12-02 NOTE — Addendum Note (Signed)
Addended by: Darleen Crocker on: 12/02/2020 05:21 PM   Modules accepted: Orders

## 2020-12-02 NOTE — Telephone Encounter (Signed)
April @ [facility] has called for refills for [id number] (CELEXA) 10 MG tablet , ALPRAZolam (XANAX) 0.25 MG tablet to [facility] [address]

## 2020-12-02 NOTE — Telephone Encounter (Signed)
Johnathan Sosa. Team please send in 90 day supply with 3 refills of each listed to pharmacy listed

## 2020-12-03 MED ORDER — ROSUVASTATIN CALCIUM 10 MG PO TABS: 10.0000 mg | ORAL_TABLET | Freq: Every day | ORAL | 3 refills | Status: DC

## 2020-12-03 MED ORDER — ALPRAZOLAM 0.25 MG PO TABS: ORAL_TABLET | ORAL | 3 refills | Status: DC

## 2020-12-03 MED ORDER — ALLOPURINOL 100 MG PO TABS: 100.0000 mg | ORAL_TABLET | Freq: Every day | ORAL | 3 refills | Status: DC

## 2020-12-03 MED ORDER — BUPROPION HCL ER (XL) 150 MG PO TB24: 150.0000 mg | ORAL_TABLET | Freq: Two times a day (BID) | ORAL | 3 refills | Status: DC

## 2020-12-03 MED ORDER — AMLODIPINE BESYLATE 2.5 MG PO TABS: 2.5000 mg | ORAL_TABLET | Freq: Every day | ORAL | 3 refills | Status: DC

## 2020-12-03 NOTE — Telephone Encounter (Signed)
Spoke with patient regarding titration. Main concern is buproprion xl 150 mg tab not working as well, explained titration process and goal to get back to 300 mg over next 4-7 days. Revisited that this titration was due to the gap in therapy. Patient was in the middle of a tax return so declined wanting to talk more. Will f/u with patient once more tomorrow as requested.

## 2020-12-03 NOTE — Telephone Encounter (Signed)
Medications sent in.

## 2020-12-03 NOTE — Telephone Encounter (Signed)
Patient called in stating that he is frustrated because [facility] has received two prescriptions for buPROPion (WELLBUTRIN XL)  150 MG and 300 MG. Osiel states he doesn't know why the 150's were sent in when Dr.Hunter knows that they do not work for him. Tanuj is also stating the medications are costing more and doesn't know why.  Jkwon said he is very frustrated with this as he has been without the medications for so long that he is just going to not take any medications until the end of April because he wants to pick up the correct medications at once. He is also running low on clonazePAM (KLONOPIN) 1 MG tablet when I asked if he would like it to be filled at [facility] or [place] he said that at the end of April he will call us back. Then told me that he may be better off to stop taking all of his medications as he doesn't feel any different.

## 2020-12-03 NOTE — Telephone Encounter (Signed)
Johnathan Sosa can you please review with patient why we reduced to 150 mg and how we are going to titrate. I believe you all reviewed this already so thought this would be best to come from you. Would not recommend him stopping all medicines.

## 2020-12-03 NOTE — Addendum Note (Signed)
Addended by: Clyde Lundborg A on: 12/03/2020 07:55 AM   Modules accepted: Orders

## 2020-12-03 NOTE — Telephone Encounter (Signed)
FYI

## 2020-12-05 ENCOUNTER — Telehealth: Payer: Self-pay

## 2020-12-05 NOTE — Chronic Care Management (AMB) (Signed)
    Chronic Care Management Pharmacy Assistant   Name: Hilding Quintanar  MRN: [id number] DOB: 1946-01-05  Reason for Encounter: Medication Cost Review    Medications: Outpatient Encounter Medications as of 12/05/2020  Medication Sig  . allopurinol (ZYLOPRIM) 100 MG tablet Take 1 tablet (100 mg total) by mouth daily.  [name] Kitchen ALPRAZolam (XANAX) 0.25 MG tablet TAKE 1 TABLET BY MOUTH TWICE DAILY AS NEEDED FOR VERTIGO. DO NOT EXCEED 2 TABS DAILY. Second dose not to be taken after 3PM if needed.  [name] Kitchen amLODipine (NORVASC) 2.5 MG tablet Take 1 tablet (2.5 mg total) by mouth daily.  [name] Kitchen buPROPion (WELLBUTRIN XL) 150 MG 24 hr tablet Take 1 tablet (150 mg total) by mouth 2 (two) times daily.  . citalopram (CELEXA) 10 MG tablet Take 1 tablet (10 mg total) by mouth daily. Per Dr. Brett Fairy per patient  . clonazePAM (KLONOPIN) 1 MG tablet TAKE 1 TABLET BY MOUTH EVERY DAY AT BEDTIME AS NEEDED. May fill today and monthly thereafter  . rosuvastatin (CRESTOR) 10 MG tablet Take 1 tablet (10 mg total) by mouth daily.  . vitamin B-12 (CYANOCOBALAMIN) 1000 MCG tablet Take 2,000 mcg by mouth daily.  [name] Kitchen VITAMIN D PO Take 800 Units by mouth daily in the afternoon.   No facility-administered encounter medications on file as of 12/05/2020.     I called and spoke with the patient's insurance to see if Bupropion XL 150 mg BID was cheaper or more expensive than Bupropion XL 300 mg QD. I was told that both of these medications were both Tier 3 and are both the same price. The medication is the same price no matter the strength or quantity per [facility]. I was told that the patient's out of pocket expenses are higher due to the patient using [facility] rather than their mail order [employer] [facility] mail order.  Cost Review performed for [facility] vs. [facility] vs. Mail Order  All three pharmacies are preferred for [facility]  [facility] Total Yearly Cost: $295.28 [facility] Total Yearly Cost: $82.54 [facility] Total Yearly Cost: $296.61  **Several unsuccessful attempts made to contact patient with this information**   April D Calhoun, [place] Pharmacist Assistant [phone]

## 2020-12-11 ENCOUNTER — Other Ambulatory Visit: Payer: Self-pay

## 2020-12-11 DIAGNOSIS — I10 Essential (primary) hypertension: Secondary | ICD-10-CM

## 2020-12-11 DIAGNOSIS — E785 Hyperlipidemia, unspecified: Secondary | ICD-10-CM

## 2020-12-19 ENCOUNTER — Other Ambulatory Visit: Payer: Self-pay | Admitting: Family Medicine

## 2020-12-19 ENCOUNTER — Other Ambulatory Visit: Payer: Self-pay

## 2020-12-19 ENCOUNTER — Ambulatory Visit (INDEPENDENT_AMBULATORY_CARE_PROVIDER_SITE_OTHER): Payer: Medicare HMO

## 2020-12-19 VITALS — BP 110/70 | HR 92 | Temp 98.6°F | Wt 198.2 lb

## 2020-12-19 DIAGNOSIS — Z0000 Encounter for general adult medical examination without abnormal findings: Secondary | ICD-10-CM

## 2020-12-19 MED ORDER — CLONAZEPAM 1 MG PO TABS: ORAL_TABLET | ORAL | 1 refills | Status: DC

## 2020-12-19 MED ORDER — ALLOPURINOL 300 MG PO TABS: 300.0000 mg | ORAL_TABLET | Freq: Every day | ORAL | 3 refills | Status: DC

## 2020-12-19 NOTE — Progress Notes (Signed)
Interestingly on PDMP-only has filled 1 clonazepam but no recent alprazolam.  I wonder if there is a discrepancy in the system-it still appears patient is due for clonazepam  Our pharmacist Edison Nasuti had explained to patient why we wanted to start with allopurinol 100 mg but patient is declining this and wants to start 300 mg again-I have sent this in though there is a chance he could have a flare of gout due to fluctuating uric acid levels
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
## 2020-12-19 NOTE — Patient Instructions (Addendum)
[name] , Thank you for taking time to come for your Medicare Wellness Visit. I appreciate your ongoing commitment to your health goals. Please review the following plan we discussed and let me know if I can assist you in the future.   Screening recommendations/referrals: Colonoscopy: Done 01/16/19 Recommended yearly ophthalmology/optometry visit for glaucoma screening and checkup Recommended yearly dental visit for hygiene and checkup  Vaccinations: Influenza vaccine: Up to date Pneumococcal vaccine: Up to date Tdap vaccine: Up to date Shingles vaccine: Shingrix discussed. Please contact your pharmacy for coverage information.    Covid-19: Completed 3/4, 3/30, & 06/08/20  Advanced directives: Please bring a copy of your health care power of attorney and living will to the office at your convenience.  Conditions/risks identified: Get back to working out   Next appointment: Follow up in one year for your annual wellness visit.   Preventive Care 60 Years and Older, Male Preventive care refers to lifestyle choices and visits with your health care provider that can promote health and wellness. What does preventive care include?  A yearly physical exam. This is also called an annual well check.  Dental exams once or twice a year.  Routine eye exams. Ask your health care provider how often you should have your eyes checked.  Personal lifestyle choices, including:  Daily care of your teeth and gums.  Regular physical activity.  Eating a healthy diet.  Avoiding tobacco and drug use.  Limiting alcohol use.  Practicing safe sex.  Taking low doses of aspirin every day.  Taking vitamin and mineral supplements as recommended by your health care provider. What happens during an annual well check? The services and screenings done by your health care provider during your annual well check will depend on your age, overall health, lifestyle risk factors, and family history of  disease. Counseling  Your health care provider may ask you questions about your:  Alcohol use.  Tobacco use.  Drug use.  Emotional well-being.  Home and relationship well-being.  Sexual activity.  Eating habits.  History of falls.  Memory and ability to understand (cognition).  Work and work Statistician. Screening  You may have the following tests or measurements:  Height, weight, and BMI.  Blood pressure.  Lipid and cholesterol levels. These may be checked every 5 years, or more frequently if you are over 9 years old.  Skin check.  Lung cancer screening. You may have this screening every year starting at age 79 if you have a 30-pack-year history of smoking and currently smoke or have quit within the past 15 years.  Fecal occult blood test (FOBT) of the stool. You may have this test every year starting at age 54.  Flexible sigmoidoscopy or colonoscopy. You may have a sigmoidoscopy every 5 years or a colonoscopy every 10 years starting at age 70.  Prostate cancer screening. Recommendations will vary depending on your family history and other risks.  Hepatitis C blood test.  Hepatitis B blood test.  Sexually transmitted disease (STD) testing.  Diabetes screening. This is done by checking your blood sugar (glucose) after you have not eaten for a while (fasting). You may have this done every 1-3 years.  Abdominal aortic aneurysm (AAA) screening. You may need this if you are a current or former smoker.  Osteoporosis. You may be screened starting at age 41 if you are at high risk. Talk with your health care provider about your test results, treatment options, and if necessary, the need for more tests.  Vaccines  Your health care provider may recommend certain vaccines, such as:  Influenza vaccine. This is recommended every year.  Tetanus, diphtheria, and acellular pertussis (Tdap, Td) vaccine. You may need a Td booster every 10 years.  Zoster vaccine. You may  need this after age 55.  Pneumococcal 13-valent conjugate (PCV13) vaccine. One dose is recommended after age 64.  Pneumococcal polysaccharide (PPSV23) vaccine. One dose is recommended after age 43. Talk to your health care provider about which screenings and vaccines you need and how often you need them. This information is not intended to replace advice given to you by your health care provider. Make sure you discuss any questions you have with your health care provider. Document Released: 09/13/2015 Document Revised: 05/06/2016 Document Reviewed: 06/18/2015 Elsevier Interactive Patient Education  2017 [place] Prevention in the Home Falls can cause injuries. They can happen to people of all ages. There are many things you can do to make your home safe and to help prevent falls. What can I do on the outside of my home?  Regularly fix the edges of walkways and driveways and fix any cracks.  Remove anything that might make you trip as you walk through a door, such as a raised step or threshold.  Trim any bushes or trees on the path to your home.  Use bright outdoor lighting.  Clear any walking paths of anything that might make someone trip, such as rocks or tools.  Regularly check to see if handrails are loose or broken. Make sure that both sides of any steps have handrails.  Any raised decks and porches should have guardrails on the edges.  Have any leaves, snow, or ice cleared regularly.  Use sand or salt on walking paths during winter.  Clean up any spills in your garage right away. This includes oil or grease spills. What can I do in the bathroom?  Use night lights.  Install grab bars by the toilet and in the tub and shower. Do not use towel bars as grab bars.  Use non-skid mats or decals in the tub or shower.  If you need to sit down in the shower, use a plastic, non-slip stool.  Keep the floor dry. Clean up any water that spills on the floor as soon as it  happens.  Remove soap buildup in the tub or shower regularly.  Attach bath mats securely with double-sided non-slip rug tape.  Do not have throw rugs and other things on the floor that can make you trip. What can I do in the bedroom?  Use night lights.  Make sure that you have a light by your bed that is easy to reach.  Do not use any sheets or blankets that are too big for your bed. They should not hang down onto the floor.  Have a firm chair that has side arms. You can use this for support while you get dressed.  Do not have throw rugs and other things on the floor that can make you trip. What can I do in the kitchen?  Clean up any spills right away.  Avoid walking on wet floors.  Keep items that you use a lot in easy-to-reach places.  If you need to reach something above you, use a strong step stool that has a grab bar.  Keep electrical cords out of the way.  Do not use floor polish or wax that makes floors slippery. If you must use wax, use non-skid floor wax.  Do not have throw rugs and other things on the floor that can make you trip. What can I do with my stairs?  Do not leave any items on the stairs.  Make sure that there are handrails on both sides of the stairs and use them. Fix handrails that are broken or loose. Make sure that handrails are as long as the stairways.  Check any carpeting to make sure that it is firmly attached to the stairs. Fix any carpet that is loose or worn.  Avoid having throw rugs at the top or bottom of the stairs. If you do have throw rugs, attach them to the floor with carpet tape.  Make sure that you have a light switch at the top of the stairs and the bottom of the stairs. If you do not have them, ask someone to add them for you. What else can I do to help prevent falls?  Wear shoes that:  Do not have high heels.  Have rubber bottoms.  Are comfortable and fit you well.  Are closed at the toe. Do not wear sandals.  If you  use a stepladder:  Make sure that it is fully opened. Do not climb a closed stepladder.  Make sure that both sides of the stepladder are locked into place.  Ask someone to hold it for you, if possible.  Clearly mark and make sure that you can see:  Any grab bars or handrails.  First and last steps.  Where the edge of each step is.  Use tools that help you move around (mobility aids) if they are needed. These include:  Canes.  Walkers.  Scooters.  Crutches.  Turn on the lights when you go into a dark area. Replace any light bulbs as soon as they burn out.  Set up your furniture so you have a clear path. Avoid moving your furniture around.  If any of your floors are uneven, fix them.  If there are any pets around you, be aware of where they are.  Review your medicines with your doctor. Some medicines can make you feel dizzy. This can increase your chance of falling. Ask your doctor what other things that you can do to help prevent falls. This information is not intended to replace advice given to you by your health care provider. Make sure you discuss any questions you have with your health care provider. Document Released: 06/13/2009 Document Revised: 01/23/2016 Document Reviewed: 09/21/2014 Elsevier Interactive Patient Education  2017 [employer].

## 2020-12-24 ENCOUNTER — Ambulatory Visit: Payer: Medicare HMO | Admitting: Neurology

## 2020-12-24 ENCOUNTER — Telehealth: Payer: Self-pay | Admitting: Neurology

## 2020-12-24 NOTE — Telephone Encounter (Signed)
Dr. Brett Fairy is out sick, I called and left voice mails on all three phone numbers listed letting patient know and asking him to call back to reschedule.

## 2020-12-31 DIAGNOSIS — C61 Malignant neoplasm of prostate: Secondary | ICD-10-CM | POA: Diagnosis not present

## 2020-12-31 LAB — PSA: PSA: <0.015

## 2021-01-08 DIAGNOSIS — C61 Malignant neoplasm of prostate: Secondary | ICD-10-CM | POA: Diagnosis not present

## 2021-01-08 DIAGNOSIS — R3915 Urgency of urination: Secondary | ICD-10-CM | POA: Diagnosis not present

## 2021-01-17 ENCOUNTER — Encounter: Payer: Self-pay | Admitting: Family Medicine

## 2021-01-20 ENCOUNTER — Telehealth: Payer: Self-pay

## 2021-01-20 NOTE — Chronic Care Management (AMB) (Signed)
    Chronic Care Management Pharmacy Assistant   Name: Johnathan Sosa  MRN: [id number] DOB: July 06, 1946  Reason for Encounter: General Patient Call  Recent office visits:  No visits noted  Recent consult visits:  No visits noted  Hospital visits:  None in previous 6 months  Medications: Outpatient Encounter Medications as of 01/20/2021  Medication Sig  . allopurinol (ZYLOPRIM) 300 MG tablet Take 1 tablet (300 mg total) by mouth daily.  [name] Kitchen ALPRAZolam (XANAX) 0.25 MG tablet TAKE 1 TABLET BY MOUTH TWICE DAILY AS NEEDED FOR VERTIGO. DO NOT EXCEED 2 TABS DAILY. Second dose not to be taken after 3PM if needed.  [name] Kitchen amLODipine (NORVASC) 2.5 MG tablet Take 1 tablet (2.5 mg total) by mouth daily.  [name] Kitchen buPROPion (WELLBUTRIN XL) 150 MG 24 hr tablet Take 1 tablet (150 mg total) by mouth 2 (two) times daily.  . citalopram (CELEXA) 10 MG tablet Take 1 tablet (10 mg total) by mouth daily. Per Dr. Brett Fairy per patient  . clonazePAM (KLONOPIN) 1 MG tablet TAKE 1 TABLET BY MOUTH EVERY DAY AT BEDTIME AS NEEDED.  Do not drive for 12 hours after taking or take alprazolam within 8 hours of taking  . rosuvastatin (CRESTOR) 10 MG tablet Take 1 tablet (10 mg total) by mouth daily.  . vitamin B-12 (CYANOCOBALAMIN) 1000 MCG tablet Take 2,000 mcg by mouth daily.  [name] Kitchen VITAMIN D PO Take 800 Units by mouth daily in the afternoon.   No facility-administered encounter medications on file as of 01/20/2021.   I spoke with [name] this morning and informed him of the results of the medication cost review performed previously. After we discussed all of his options, [name] decided to use [facility] to fill his prescriptions. As he was unable to do so at the time, he agreed to gather his on hand supply to complete to on -boarding process tomorrow.  Star Rating Drugs: Rosuvastatin 10 mg- 90 DS last filled 07/26/20  Wilford Sports CPA, [place]  Wilford Sports CPA, CMA

## 2021-02-18 ENCOUNTER — Encounter: Payer: Self-pay | Admitting: Family Medicine

## 2021-02-18 ENCOUNTER — Other Ambulatory Visit: Payer: Self-pay

## 2021-02-18 ENCOUNTER — Ambulatory Visit (INDEPENDENT_AMBULATORY_CARE_PROVIDER_SITE_OTHER): Payer: Medicare HMO | Admitting: Family Medicine

## 2021-02-18 VITALS — BP 130/76 | HR 85 | Temp 97.3°F | Ht 74.0 in | Wt 198.4 lb

## 2021-02-18 DIAGNOSIS — R413 Other amnesia: Secondary | ICD-10-CM

## 2021-02-18 DIAGNOSIS — E785 Hyperlipidemia, unspecified: Secondary | ICD-10-CM | POA: Diagnosis not present

## 2021-02-18 DIAGNOSIS — E559 Vitamin D deficiency, unspecified: Secondary | ICD-10-CM

## 2021-02-18 DIAGNOSIS — S069X9A Unspecified intracranial injury with loss of consciousness of unspecified duration, initial encounter: Secondary | ICD-10-CM

## 2021-02-18 DIAGNOSIS — R42 Dizziness and giddiness: Secondary | ICD-10-CM

## 2021-02-18 DIAGNOSIS — Z23 Encounter for immunization: Secondary | ICD-10-CM

## 2021-02-18 DIAGNOSIS — S069XAA Unspecified intracranial injury with loss of consciousness status unknown, initial encounter: Secondary | ICD-10-CM

## 2021-02-18 DIAGNOSIS — I700 Atherosclerosis of aorta: Secondary | ICD-10-CM

## 2021-02-18 DIAGNOSIS — Z0000 Encounter for general adult medical examination without abnormal findings: Secondary | ICD-10-CM

## 2021-02-18 DIAGNOSIS — E538 Deficiency of other specified B group vitamins: Secondary | ICD-10-CM

## 2021-02-18 DIAGNOSIS — G4700 Insomnia, unspecified: Secondary | ICD-10-CM

## 2021-02-18 NOTE — Addendum Note (Signed)
Addended by: Linton Ham on: 02/18/2021 03:09 PM   Modules accepted: Orders

## 2021-02-18 NOTE — Patient Instructions (Addendum)
Health Maintenance Due  Topic Date Due   Zoster Vaccines- Shingrix (1 of 2) Patient states that he has had his first immunization at [facility] I will call them. Call us with dates when done Never done   COVID-19 Vaccine (4 - Booster for [employer] series) Will get this scheduled. Cheaper at pharmacy 09/08/2020   Prevnar 20 today  Please stop by lab before you go If you have mychart- we will send your results within 3 business days of [place] receiving them.  If you do not have mychart- we will call you about results within 5 business days of [place] receiving them.  *please also note that you will see labs on mychart as soon as they post. I will later go in and write notes on them- will say "notes from Dr. [name]"  Recommended follow up: Return in about 6 months (around 08/20/2021) for follow up- or sooner if needed.
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
## 2021-02-19 ENCOUNTER — Other Ambulatory Visit: Payer: Self-pay | Admitting: Family Medicine

## 2021-02-19 LAB — COMPREHENSIVE METABOLIC PANEL
ALT: 21 U/L (ref 0–53)
AST: 15 U/L (ref 0–37)
Albumin: 4.2 g/dL (ref 3.5–5.2)
Alkaline Phosphatase: 53 U/L (ref 39–117)
BUN: 21 mg/dL (ref 6–23)
CO2: 24 mEq/L (ref 19–32)
Calcium: 9.4 mg/dL (ref 8.4–10.5)
Chloride: 107 mEq/L (ref 96–112)
Creatinine, Ser: 1.24 mg/dL (ref 0.40–1.50)
GFR: 57.97 mL/min — ABNORMAL LOW (ref >60.00)
Glucose, Bld: 118 mg/dL — ABNORMAL HIGH (ref 70–99)
Potassium: 3.8 mEq/L (ref 3.5–5.1)
Sodium: 141 mEq/L (ref 135–145)
Total Bilirubin: 0.4 mg/dL (ref 0.2–1.2)
Total Protein: 6.5 g/dL (ref 6.0–8.3)

## 2021-02-19 LAB — LIPID PANEL
Cholesterol: 147 mg/dL (ref 0–200)
HDL: 36.7 mg/dL — ABNORMAL LOW (ref >39.00)
NonHDL: 110.34
Total CHOL/HDL Ratio: 4
Triglycerides: 246 mg/dL — ABNORMAL HIGH (ref 0.0–149.0)
VLDL: 49.2 mg/dL — ABNORMAL HIGH (ref 0.0–40.0)

## 2021-02-19 LAB — VITAMIN D 25 HYDROXY (VIT D DEFICIENCY, FRACTURES): VITD: 26.01 ng/mL — ABNORMAL LOW (ref 30.00–100.00)

## 2021-02-19 LAB — CBC WITH DIFFERENTIAL/PLATELET
Basophils Absolute: 0.1 10*3/uL (ref 0.0–0.1)
Basophils Relative: 1.1 % (ref 0.0–3.0)
Eosinophils Absolute: 0.3 10*3/uL (ref 0.0–0.7)
Eosinophils Relative: 4.4 % (ref 0.0–5.0)
HCT: 42.4 % (ref 39.0–52.0)
Hemoglobin: 14.8 g/dL (ref 13.0–17.0)
Lymphocytes Relative: 17.2 % (ref 12.0–46.0)
Lymphs Abs: 1.2 10*3/uL (ref 0.7–4.0)
MCHC: 34.8 g/dL (ref 30.0–36.0)
MCV: 91.4 fl (ref 78.0–100.0)
Monocytes Absolute: 0.5 10*3/uL (ref 0.1–1.0)
Monocytes Relative: 6.8 % (ref 3.0–12.0)
Neutro Abs: 4.8 10*3/uL (ref 1.4–7.7)
Neutrophils Relative %: 70.5 % (ref 43.0–77.0)
Platelets: 226 10*3/uL (ref 150.0–400.0)
RBC: 4.64 Mil/uL (ref 4.22–5.81)
RDW: 13.1 % (ref 11.5–15.5)
WBC: 6.9 10*3/uL (ref 4.0–10.5)

## 2021-02-19 LAB — LDL CHOLESTEROL, DIRECT: Direct LDL: 90 mg/dL

## 2021-02-19 LAB — VITAMIN B12: Vitamin B-12: 393 pg/mL (ref 211–911)

## 2021-02-19 MED ORDER — VITAMIN D (ERGOCALCIFEROL) 1.25 MG (50000 UNIT) PO CAPS: 50000.0000 [IU] | ORAL_CAPSULE | ORAL | 0 refills | Status: DC

## 2021-02-20 ENCOUNTER — Ambulatory Visit: Payer: Medicare HMO | Admitting: Neurology

## 2021-02-26 ENCOUNTER — Telehealth: Payer: Self-pay

## 2021-02-26 NOTE — Chronic Care Management (AMB) (Signed)
    Chronic Care Management Pharmacy Assistant   Name: Johnathan Sosa  MRN: [id number] DOB: 08/11/1946  Reason for Encounter: Patient Call  Recent office visits:  02/18/21- Johnathan Reddish, MD- seen for annual physical, Prevnar administered , labs ordered, follow up 6 months Orders from labs: Vitamin D 50000 U weekly for 24 weeks   Recent consult visits:  No visits noted  Hospital visits:  None in previous 6 months  Medications: Outpatient Encounter Medications as of 02/26/2021  Medication Sig   allopurinol (ZYLOPRIM) 300 MG tablet Take 1 tablet (300 mg total) by mouth daily.   ALPRAZolam (XANAX) 0.25 MG tablet TAKE 1 TABLET BY MOUTH TWICE DAILY AS NEEDED FOR VERTIGO. DO NOT EXCEED 2 TABS DAILY. Second dose not to be taken after 3PM if needed.   amLODipine (NORVASC) 2.5 MG tablet Take 1 tablet (2.5 mg total) by mouth daily.   buPROPion (WELLBUTRIN XL) 150 MG 24 hr tablet Take 1 tablet (150 mg total) by mouth 2 (two) times daily. (Patient taking differently: Take 300 mg by mouth daily.)   citalopram (CELEXA) 10 MG tablet Take 1 tablet (10 mg total) by mouth daily. Per Dr. Brett Fairy per patient   clonazePAM (KLONOPIN) 1 MG tablet TAKE 1 TABLET BY MOUTH EVERY DAY AT BEDTIME AS NEEDED.  Do not drive for 12 hours after taking or take alprazolam within 8 hours of taking   rosuvastatin (CRESTOR) 10 MG tablet Take 1 tablet (10 mg total) by mouth daily.   vitamin B-12 (CYANOCOBALAMIN) 1000 MCG tablet Take 2,000 mcg by mouth daily.   VITAMIN D PO Take 800 Units by mouth daily in the afternoon.   Vitamin D, Ergocalciferol, (DRISDOL) 1.25 MG (50000 UNIT) CAPS capsule Take 1 capsule (50,000 Units total) by mouth every 7 (seven) days.   No facility-administered encounter medications on file as of 02/26/2021.   Johnathan Sosa called me this morning and expressed some concerns about getting his medications and his health care.  He was recently prescribed Vitamin D and unfortunately it was sent to the wrong  pharmacy. We were able to review all medications that he requested be delivered to him from [facility] and an onboarding form was completed. He also requested a formal copy of his latest visit with his PCP. I informed him that the easiest way to access this information was through [place]. He declined this suggestion as he is fed up with technology and also declined to pick up a copy at the practice. He requested that a copy of this visit be mailed to him.  Miscellaneous Notes: Patient currently takes 300 mg of Allopurinol in the morning, but he was mistakenly given a prescription for 100 mg which he now takes. He is currently taking 3 tabs of 100 mg strength in order to finish old prescription. He will be starting his new prescription soon, but wanted to express his frustration with having to account for this mistake.   Patient reported taking 300 mg of Bupropion every morning   Patient reported adding Vitamin C to his regimen but is unsure of the exact dosage  Cottie Banda, CMA

## 2021-03-04 ENCOUNTER — Encounter: Payer: Self-pay | Admitting: Neurology

## 2021-03-04 ENCOUNTER — Ambulatory Visit: Payer: Medicare HMO | Admitting: Neurology

## 2021-03-04 DIAGNOSIS — R42 Dizziness and giddiness: Secondary | ICD-10-CM | POA: Diagnosis not present

## 2021-03-04 MED ORDER — CLONAZEPAM 1 MG PO TABS: ORAL_TABLET | ORAL | 1 refills | Status: DC

## 2021-03-04 MED ORDER — ALPRAZOLAM 0.25 MG PO TABS: ORAL_TABLET | ORAL | 3 refills | Status: DC

## 2021-03-04 NOTE — Patient Instructions (Signed)

## 2021-03-04 NOTE — Addendum Note (Signed)
Addended by: Larey Seat on: 03/04/2021 02:21 PM   Modules accepted: Orders
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
## 2021-03-25 ENCOUNTER — Telehealth: Payer: Self-pay

## 2021-03-25 NOTE — Telephone Encounter (Signed)
Patient is calling in stating that he was supposed to be getting a vitamin D shot, and is wondering where he can go to get one.

## 2021-03-26 ENCOUNTER — Other Ambulatory Visit: Payer: Self-pay

## 2021-03-26 NOTE — Telephone Encounter (Signed)
From result note on 02/19/21 "Vitamin D is slightly low-take 50,000 units weekly for 12 weeks-I sent this tomail order.  After you finish this may return to 800 units daily." This was never sent in, should pt be on the pill or is there a shot you were wanting him to take?

## 2021-03-26 NOTE — Telephone Encounter (Signed)
No shot was needed.   When you say never sent in- can you clarify please? Someone did eventually discontinue it - looks like a nurse at an outside office as far as I can tell but pharmacy confirmed receipt 02/19/21- as far as I can tell this was sent in. Did he take this? If not you can resend in. Then go back to 800 or 1000 units daily when he finishes   Disp Refills Start End   Vitamin D, Ergocalciferol, (DRISDOL) 1.25 MG (50000 UNIT) CAPS capsule (Discontinued) 12 capsule 0 02/19/2021 03/04/2021   Sig - Route: Take 1 capsule (50,000 Units total) by mouth every 7 (seven) days. - Oral   Sent to pharmacy as: Vitamin D, Ergocalciferol, (DRISDOL) 1.25 MG (50000 UNIT) Cap capsule   Reason for Discontinue: Error   E-Prescribing Status: Receipt confirmed by pharmacy (02/19/2021  1:41 PM EDT)

## 2021-03-26 NOTE — Telephone Encounter (Addendum)
Patient called in stating that on his insurance summary for the 6/21 it says he was charged for a vitamin D shot, not labs. When I explained to patient that looking at the transaction inquiry that we charged for a vitamin d blood draw. Johnathan Sosa then said Dr.Hunter specifically told Johnathan Sosa that he needed a vitamin D shot and has been trying to deal with this for over a month and no one has told him where to go. Patient is requesting a phone call in return today. Sending message to Tomasa Hosteller to see if she can talk to patient.

## 2021-03-26 NOTE — Telephone Encounter (Signed)
Hasna see below as well.

## 2021-03-26 NOTE — Telephone Encounter (Signed)
Called and lm on pt vm tcb. 

## 2021-03-27 NOTE — Telephone Encounter (Signed)
Patient is returning a call from [place], and would like if someone would leave him a message.

## 2021-04-03 ENCOUNTER — Other Ambulatory Visit: Payer: Self-pay

## 2021-04-03 MED ORDER — VITAMIN D (ERGOCALCIFEROL) 1.25 MG (50000 UNIT) PO CAPS: 50000.0000 [IU] | ORAL_CAPSULE | ORAL | 0 refills | Status: DC

## 2021-04-21 ENCOUNTER — Telehealth: Payer: Self-pay | Admitting: Pharmacist

## 2021-04-21 NOTE — Chronic Care Management (AMB) (Addendum)
    Chronic Care Management Pharmacy Assistant   Name: Johnathan Sosa  MRN: [id number] DOB: [birth year]   Reason for Encounter: General Adherence Call    Recent office visits:  None  Recent consult visits:  03/24/2021 (neurology) [name], Asencion Partridge, MD; visit for vertigo due to brain injury, controlled on benzos, no medication changes indicated.  Hospital visits:  None in previous 6 months  Medications: Outpatient Encounter Medications as of 04/21/2021  Medication Sig   allopurinol (ZYLOPRIM) 300 MG tablet Take 1 tablet (300 mg total) by mouth daily.   ALPRAZolam (XANAX) 0.25 MG tablet TAKE 1 TABLET BY MOUTH TWICE DAILY AS NEEDED FOR VERTIGO. DO NOT EXCEED 2 TABS DAILY. Second dose not to be taken after 3PM if needed.   amLODipine (NORVASC) 2.5 MG tablet Take 1 tablet (2.5 mg total) by mouth daily.   buPROPion (WELLBUTRIN XL) 150 MG 24 hr tablet Take 1 tablet (150 mg total) by mouth 2 (two) times daily. (Patient taking differently: Take 300 mg by mouth daily.)   citalopram (CELEXA) 10 MG tablet Take 1 tablet (10 mg total) by mouth daily. Per Dr. Brett Fairy per patient   clonazePAM (KLONOPIN) 1 MG tablet TAKE 1 TABLET BY MOUTH EVERY DAY AT BEDTIME AS NEEDED.  Do not drive for 12 hours after taking or take alprazolam within 8 hours of taking   rosuvastatin (CRESTOR) 10 MG tablet Take 1 tablet (10 mg total) by mouth daily.   vitamin B-12 (CYANOCOBALAMIN) 1000 MCG tablet Take 2,000 mcg by mouth daily.   VITAMIN D PO Take 800 Units by mouth daily in the afternoon.   Vitamin D, Ergocalciferol, (DRISDOL) 1.25 MG (50000 UNIT) CAPS capsule Take 1 capsule (50,000 Units total) by mouth every 7 (seven) days.   No facility-administered encounter medications on file as of 04/21/2021.    Patient Questions: Have you had any problems recently with your health? Patient states he has not had any problems recently with his health.  Have you had any problems with your pharmacy? Patient states he thought he  was supposed to start using [facility] but he hasn't heard anything else about it.  What issues or side effects are you having with your medications? Patient states he is not currently having any issues or side effects with any of his medications.  What would you like me to pass along to Leata Mouse, CPP for him to help you with?  Patient states he does not have anything to pass along at this time. Patient states he would like to know about [facility] so he can have his medications delivered.  What can we do to take care of you better? Patient states he would like to have his medications delivered to his home.  Future Appointments  Date Time Provider [place]  08/21/2021  2:40 PM Marin Olp, MD [facility]  09/09/2021  1:30 PM [name], Asencion Partridge, MD [facility] None  12/25/2021  2:30 PM [facility] HEALTH COACH [facility]    Star Rating Drugs: Rosuvastatin last filled 12/18/2020 90 DS  April D Calhoun, [place] Pharmacist Assistant [phone]   5 minutes spent in review, coordination, and documentation.  Reviewed by: Beverly Milch, PharmD Clinical Pharmacist [phone]

## 2021-07-10 DIAGNOSIS — H0102B Squamous blepharitis left eye, upper and lower eyelids: Secondary | ICD-10-CM | POA: Diagnosis not present

## 2021-07-10 DIAGNOSIS — H31092 Other chorioretinal scars, left eye: Secondary | ICD-10-CM | POA: Diagnosis not present

## 2021-07-10 DIAGNOSIS — H43811 Vitreous degeneration, right eye: Secondary | ICD-10-CM | POA: Diagnosis not present

## 2021-07-10 DIAGNOSIS — H26493 Other secondary cataract, bilateral: Secondary | ICD-10-CM | POA: Diagnosis not present

## 2021-07-10 DIAGNOSIS — H43392 Other vitreous opacities, left eye: Secondary | ICD-10-CM | POA: Diagnosis not present

## 2021-07-10 DIAGNOSIS — Z961 Presence of intraocular lens: Secondary | ICD-10-CM | POA: Diagnosis not present

## 2021-07-10 DIAGNOSIS — H0102A Squamous blepharitis right eye, upper and lower eyelids: Secondary | ICD-10-CM | POA: Diagnosis not present

## 2021-07-10 DIAGNOSIS — H04123 Dry eye syndrome of bilateral lacrimal glands: Secondary | ICD-10-CM | POA: Diagnosis not present

## 2021-07-10 DIAGNOSIS — H1045 Other chronic allergic conjunctivitis: Secondary | ICD-10-CM | POA: Diagnosis not present

## 2021-07-11 DIAGNOSIS — C61 Malignant neoplasm of prostate: Secondary | ICD-10-CM | POA: Diagnosis not present

## 2021-07-18 DIAGNOSIS — C61 Malignant neoplasm of prostate: Secondary | ICD-10-CM | POA: Diagnosis not present

## 2021-07-18 DIAGNOSIS — N3946 Mixed incontinence: Secondary | ICD-10-CM | POA: Diagnosis not present

## 2021-07-21 ENCOUNTER — Other Ambulatory Visit: Payer: Self-pay

## 2021-07-21 ENCOUNTER — Telehealth: Payer: Self-pay

## 2021-07-21 MED ORDER — BUPROPION HCL ER (XL) 300 MG PO TB24: 300.0000 mg | ORAL_TABLET | Freq: Every day | ORAL | 2 refills | Status: DC

## 2021-07-21 NOTE — Telephone Encounter (Signed)
Rx for 300mg  cent to [facility].

## 2021-07-21 NOTE — Telephone Encounter (Signed)
Nurse Assessment Nurse: Luvenia Starch, RN, Kayla Date/Time (Eastern Time): 07/19/2021 11:31:07 PM Please select the assessment type ---Refill Additional Documentation ---Caller reports he is needing a refill on his Wellbutrin XR 300mg . Reports that the pharmacy is saying they do not show in their system they have ever filled 300mg  but he has the bottle that says otherwise. Caller is needing further assistance with getting at least enough medication to get him through the weekend since he is now completely out. Pharmacy is closed at this time so this nurse is unable to speak to pharmacist to get further clarification on this issue on their end to see if it is possible to assist pt further per our directives.  Does the patient have enough medication to last until the office opens? ---Unable to obtain loaner dose from Pharmacy Does the client directives allow for assistance with medications after hours? ---No Additional Documentation ---Advised caller to call back in AM so a nurse can see if the pharmacist can be reached in case we can assist further with the issue he is experiencing. Pt stated understanding. Pt denies any new/worsening symptoms needing triaged at this time.  07/19/2021 8:49:14 PM Send to Manuel Garcia, RN, Lattie Haw 07/19/2021 8:51:04 PM Send to [facility], RN, Lattie Haw 07/19/2021 11:34:34 PM Clinical Call Yes Luvenia Starch, RN, Lonn [place]

## 2021-07-22 ENCOUNTER — Other Ambulatory Visit: Payer: Self-pay

## 2021-07-22 ENCOUNTER — Telehealth: Payer: Self-pay

## 2021-07-22 MED ORDER — BUPROPION HCL ER (XL) 300 MG PO TB24: 300.0000 mg | ORAL_TABLET | Freq: Every day | ORAL | 2 refills | Status: DC

## 2021-07-22 NOTE — Telephone Encounter (Signed)
Rx sent in [facility] [address]

## 2021-07-22 NOTE — Telephone Encounter (Signed)
..   Encourage patient to contact the pharmacy for refills or they can request refills through [facility]: 02/18/21  NEXT APPOINTMENT DATE: na  MEDICATION:  wellbutrin  Is the patient out of medication?   PHARMACY:  Was sent to [facility].  Does not want to deal with [facility] anymore.  Is requesting script to be sent to Kristopher Oppenheim at [facility].    Let patient know to contact pharmacy at the end of the day to make sure medication is ready.  Please notify patient to allow 48-72 hours to process  CLINICAL FILLS OUT ALL BELOW:   LAST REFILL:  QTY:  REFILL DATE:    OTHER COMMENTS: Patient is currently out of medication.  Is requesting script to be sent as soon as possible.  Please give patient a call at [phone].   Okay for refill?  Please advise

## 2021-07-23 ENCOUNTER — Telehealth: Payer: Self-pay

## 2021-07-23 NOTE — Telephone Encounter (Signed)
I will let Darrick Meigs discussed with him-I am fine with him being removed

## 2021-07-23 NOTE — Telephone Encounter (Signed)
Patient is requesting to be removed from our pharmacy services.  Stating that he is having difficulty with getting mediations refilled through [facility].

## 2021-07-23 NOTE — Telephone Encounter (Signed)
Patient would like to know if he is up to date on his pneumonia vacs.

## 2021-07-28 NOTE — Telephone Encounter (Signed)
I will give him a call.  [facility] should be the mail order associated with his insurance.  I will try and figure out if there is anything we can do to help him with his refills.

## 2021-07-29 NOTE — Telephone Encounter (Signed)
Tried reaching out to pt and mailbox is full, if pt returns call he is all caught up on his pneumonia shots.

## 2021-08-01 NOTE — Telephone Encounter (Signed)
Tried calling patient. Unable to leave message, mailbox full.

## 2021-08-15 DIAGNOSIS — E785 Hyperlipidemia, unspecified: Secondary | ICD-10-CM | POA: Diagnosis not present

## 2021-08-15 DIAGNOSIS — I10 Essential (primary) hypertension: Secondary | ICD-10-CM | POA: Diagnosis not present

## 2021-08-15 DIAGNOSIS — F324 Major depressive disorder, single episode, in partial remission: Secondary | ICD-10-CM | POA: Diagnosis not present

## 2021-08-15 DIAGNOSIS — R69 Illness, unspecified: Secondary | ICD-10-CM | POA: Diagnosis not present

## 2021-08-15 DIAGNOSIS — R32 Unspecified urinary incontinence: Secondary | ICD-10-CM | POA: Diagnosis not present

## 2021-08-15 DIAGNOSIS — Z809 Family history of malignant neoplasm, unspecified: Secondary | ICD-10-CM | POA: Diagnosis not present

## 2021-08-15 DIAGNOSIS — N3281 Overactive bladder: Secondary | ICD-10-CM | POA: Diagnosis not present

## 2021-08-15 DIAGNOSIS — M109 Gout, unspecified: Secondary | ICD-10-CM | POA: Diagnosis not present

## 2021-08-15 DIAGNOSIS — Z604 Social exclusion and rejection: Secondary | ICD-10-CM | POA: Diagnosis not present

## 2021-08-15 DIAGNOSIS — Z8249 Family history of ischemic heart disease and other diseases of the circulatory system: Secondary | ICD-10-CM | POA: Diagnosis not present

## 2021-08-15 DIAGNOSIS — F988 Other specified behavioral and emotional disorders with onset usually occurring in childhood and adolescence: Secondary | ICD-10-CM | POA: Diagnosis not present

## 2021-08-15 DIAGNOSIS — F4323 Adjustment disorder with mixed anxiety and depressed mood: Secondary | ICD-10-CM | POA: Diagnosis not present

## 2021-08-15 DIAGNOSIS — N529 Male erectile dysfunction, unspecified: Secondary | ICD-10-CM | POA: Diagnosis not present

## 2021-08-21 ENCOUNTER — Ambulatory Visit (INDEPENDENT_AMBULATORY_CARE_PROVIDER_SITE_OTHER): Payer: Medicare HMO | Admitting: Family Medicine

## 2021-08-21 ENCOUNTER — Encounter: Payer: Self-pay | Admitting: Family Medicine

## 2021-08-21 VITALS — BP 132/88 | HR 82 | Temp 97.4°F | Ht 74.0 in | Wt 192.8 lb

## 2021-08-21 DIAGNOSIS — F3342 Major depressive disorder, recurrent, in full remission: Secondary | ICD-10-CM | POA: Diagnosis not present

## 2021-08-21 DIAGNOSIS — I10 Essential (primary) hypertension: Secondary | ICD-10-CM | POA: Diagnosis not present

## 2021-08-21 DIAGNOSIS — M10079 Idiopathic gout, unspecified ankle and foot: Secondary | ICD-10-CM

## 2021-08-21 DIAGNOSIS — E785 Hyperlipidemia, unspecified: Secondary | ICD-10-CM | POA: Diagnosis not present

## 2021-08-21 DIAGNOSIS — E538 Deficiency of other specified B group vitamins: Secondary | ICD-10-CM | POA: Diagnosis not present

## 2021-08-21 DIAGNOSIS — R413 Other amnesia: Secondary | ICD-10-CM | POA: Diagnosis not present

## 2021-08-21 DIAGNOSIS — R69 Illness, unspecified: Secondary | ICD-10-CM | POA: Diagnosis not present

## 2021-08-21 DIAGNOSIS — E559 Vitamin D deficiency, unspecified: Secondary | ICD-10-CM

## 2021-08-21 NOTE — Patient Instructions (Addendum)
No labs today - we can have these done at your next visit.  I think it is best to discuss with Dr. Brett Fairy in regards to your alprazolam medication. Since you feel dizziness is better ideally would slowly eliminate medication  Ideally would use a walker at all times- really need to avoid falls  Recommended follow up: Return in about 6 months (around 02/19/2022) for a physical.

## 2021-08-21 NOTE — Progress Notes (Signed)
Phone [phone] In person visit   Subjective:   Johnathan Sosa is a 75 y.o. year old very pleasant male patient who presents for/with See problem oriented charting Chief Complaint  Patient presents with   Follow-up    Pt being seen as 6 mon f/u; pt has no complaints and feels well   This visit occurred during the SARS-CoV-2 public health emergency.  Safety protocols were in place, including screening questions prior to the visit, additional usage of staff PPE, and extensive cleaning of exam room while observing appropriate contact time as indicated for disinfecting solutions.   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    Priority: High   Memory loss     Priority: High   Vitamin B12 deficiency 01/25/2020    Priority: Medium    Vitamin D deficiency 01/25/2020    Priority: Medium    History of adenomatous polyp of colon 01/25/2019    Priority: Medium    Aortic atherosclerosis ([place]) 12/21/2017    Priority: Medium    BPPV (benign paroxysmal positional vertigo) 12/21/2016    Priority: Medium    Attention deficit hyperactivity disorder (ADHD) 07/06/2016    Priority: Medium    Gout     Priority: Medium    Insomnia     Priority: Medium    Hyperlipidemia     Priority: Medium    Major depression in full remission ([place])     Priority: Medium    HTN (hypertension) 04/29/2012    Priority: Medium    Former smoker 10/01/2015    Priority: Low   Erectile dysfunction 10/01/2015    Priority: Low   Migraine headache     Priority: Low   Abnormal eye movements  10/24/2014    Priority: Low   Spells of speech arrest 10/24/2014    Priority: Low   GERD (gastroesophageal reflux disease)     Priority: Low   Ataxia 08/05/2020   Generalized hyperreflexia 08/05/2020   Vibration sensory loss 08/05/2020   Recurrent falls while walking 02/06/2019   MCI (mild cognitive impairment) with memory loss 02/06/2019   Hypersomnia with sleep apnea 02/06/2019   Excessive daytime sleepiness 06/07/2018   MCI (mild cognitive impairment) 06/07/2018   Fatigue due to depression 06/07/2018   Ileus ([place]) 09/04/2017   Screening for prostate cancer 10/16/2016   Palpitations 07/20/2016    Medications- reviewed and updated Current Outpatient Medications  Medication Sig Dispense Refill   allopurinol (ZYLOPRIM) 300 MG tablet Take 1 tablet (300 mg total) by mouth daily. 90 tablet 3   ALPRAZolam (XANAX) 0.25 MG tablet TAKE 1 TABLET BY MOUTH TWICE DAILY AS NEEDED FOR VERTIGO. DO NOT EXCEED 2 TABS DAILY. Second dose not to be taken after 3PM if needed. 60 tablet 3   amLODipine (NORVASC) 2.5 MG tablet Take 1 tablet (2.5 mg total) by mouth daily. 90 tablet 3   buPROPion (WELLBUTRIN XL) 300 MG 24 hr tablet Take 1 tablet (300 mg total) by mouth daily. 90 tablet 2   citalopram (CELEXA) 10 MG tablet Take  1 tablet (10 mg total) by mouth daily. Per Dr. Brett Fairy per patient 90 tablet 3   clonazePAM (KLONOPIN) 1 MG tablet TAKE 1 TABLET BY MOUTH EVERY DAY AT BEDTIME AS NEEDED.  Do not drive for 12 hours after taking or take alprazolam within 8 hours of taking 90 tablet 1   rosuvastatin (CRESTOR) 10 MG tablet Take 1 tablet (10 mg total) by mouth daily. 90 tablet 3   vitamin B-12 (CYANOCOBALAMIN) 1000 MCG tablet Take 2,000 mcg by mouth daily.     VITAMIN D PO Take 800 Units by mouth daily in the afternoon.     Vitamin D, Ergocalciferol, (DRISDOL) 1.25 MG (50000 UNIT) CAPS capsule Take 1 capsule (50,000 Units total) by mouth every 7 (seven) days. 12 capsule 0   No current facility-administered  medications for this visit.     Objective:  BP 132/88 (BP Location: Right Arm)    Pulse 82    Temp (!) 97.4 F (36.3 C) (Temporal)    Ht 6\' 2"  (1.88 m)    Wt 192 lb 12.8 oz (87.5 kg)    SpO2 97%    BMI 24.75 kg/m  Gen: NAD, resting comfortably CV: RRR no murmurs rubs or gallops Lungs: CTAB no crackles, wheeze, rhonchi Ext: no edema     Assessment and Plan  # Vertigo/dizziness S:medication: Xanax 0.25 mg twice daily as needed prescribed by neurology- lately getting away with taking this once for the most part - Severe episodes in the past- have advised patient not to drive for concern that he could have an episode while driving. States no recent issues - still declines walker Dr. Brett Fairy - has declined vestibular rehab -Had updated MRI of the brain most recently 08/17/2020 largely reassuring other than some small vessel/microvascular disease.  Extensive blood work was largely reassuring.  Patient also had blood work for memory loss including sedimentation rate, ferritin, B12 and folate, CBC, CMP, TSH, protein electrophoresis with Dr. Brett Fairy that was unremarkable.  -Patient did have a visit with Dr. Brett Fairy on 03/04/2021 - vertigo was controlled on benzos, MOCA was 27/30. Most notably Dr.[name] was concerned for patient taking xanax consistently twice a day in addition to klonopin  Today, overall feeling pretty well- has had some falls but states related to vision issues- still declines cane A/P: vertigo overall stable with just once a day alprazolam for most part  -interestingly no recent alprazolam refills noted- clonazepam noted 11/20/20 -for now continue neuro follo wup  #hypertension S: medication: Amlodipine 2.5 mg BP Readings from Last 3 Encounters:  08/21/21 132/88  03/04/21 115/76  02/18/21 130/76  A/P: Controlled. Continue current medications.   # Depression S: Medication:Wellbutrin 300 mg, Celexa 10 mg, Klonopin 1 mg for insomnia - did report a fall with Dr.  Brett Fairy on 03/04/2021 visit - stated he was trying to chase some coyotes away in his yard from front porch at night and fell - no recent falls on Klonopin since that time- none within 12 hours of this A/P: depression well controlled- contiue current meds  Told him once a gain nerovus about falls on clonazepam - he has had falls but not within 12 hours of medicine  #Gout S: no flares in over a year on allopurinol 300 mg Lab Results  Component Value Date   LABURIC 4.2 01/24/2020  A/P:well controlled- continue current meds   #hyperlipidemia/aortic atherosclerosis with LDL goal under 70 S: Medication:rosuvastatin 10 mg  Lab Results  Component Value Date   CHOL 147 02/18/2021  HDL 36.70 (L) 02/18/2021   LDLCALC 71 10/08/2017   LDLDIRECT 90.0 02/18/2021   TRIG 246.0 (H) 02/18/2021   CHOLHDL 4 02/18/2021  A/P: mild LDL elevations above goal - discussed could push for under 70 with meds- he prefers to work on diet/exercise considering no heart attack or stroke history  #Overactive bladder-On Gemtesa 75 mg tablets per Dr. Alinda Money but was too expensive- samples were effective. Discouraging enough wants to avoid the gym.    # B12 deficiency S: Current treatment/medication (oral vs. IM): cyanocobalamin 2000 mcg daily Lab Results  Component Value Date   EAVWUJWJ19 147 02/18/2021  A/P: has been controlled- continue current meds   #Vitamin D deficiency S: Medication: vitamin D OTC 800 units daily after completing course of 50,000 units weekly  Last vitamin D Lab Results  Component Value Date   VD25OH 26.01 (L) 02/18/2021  A/P: suspect stable- update nexxt labs   #Memory loss-occurred after prior closed traumatic brain injury-patient does not report worsening symptoms-neurology has offered Aricept in the past which she has declined with most recent MMSE 27 out of 30 with neurology   Recommended follow up: Return in about 6 months (around 02/19/2022) for a physical. Future Appointments   Date Time Provider [place]  09/09/2021  1:30 PM [name], Asencion Partridge, MD [facility] None  12/25/2021  2:30 PM [facility]    Lab/Order associations:   ICD-10-CM   1. Hyperlipidemia, unspecified hyperlipidemia type  E78.5     2. Recurrent major depressive disorder, in full remission ([place])  F33.42     3. Essential hypertension  I10     4. Idiopathic gout of foot, unspecified chronicity, unspecified laterality  M10.079     5. Vitamin B12 deficiency  E53.8     6. Vitamin D deficiency  E55.9     7. Memory loss  R41.3      No orders of the defined types were placed in this encounter.  I,Harris Phan,acting as a Education administrator for [name], MD.,have documented all relevant documentation on the behalf of [name], MD,as directed by  [name], MD while in the presence of [name], MD.  I, [name], MD, have reviewed all documentation for this visit. The documentation on 08/21/21 for the exam, diagnosis, procedures, and orders are all accurate and complete.   Return precautions advised.  [name], MD

## 2021-09-09 ENCOUNTER — Ambulatory Visit: Payer: Medicare HMO | Admitting: Neurology

## 2021-09-09 ENCOUNTER — Encounter: Payer: Self-pay | Admitting: Neurology

## 2021-09-09 VITALS — BP 132/86 | HR 80 | Ht 74.0 in | Wt 196.0 lb

## 2021-09-09 DIAGNOSIS — S069X1S Unspecified intracranial injury with loss of consciousness of 30 minutes or less, sequela: Secondary | ICD-10-CM | POA: Diagnosis not present

## 2021-09-09 DIAGNOSIS — R0683 Snoring: Secondary | ICD-10-CM | POA: Diagnosis not present

## 2021-09-09 DIAGNOSIS — G4719 Other hypersomnia: Secondary | ICD-10-CM

## 2021-09-09 DIAGNOSIS — G3184 Mild cognitive impairment, so stated: Secondary | ICD-10-CM | POA: Diagnosis not present

## 2021-09-09 DIAGNOSIS — G4711 Idiopathic hypersomnia with long sleep time: Secondary | ICD-10-CM
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
## 2021-10-08 ENCOUNTER — Ambulatory Visit (INDEPENDENT_AMBULATORY_CARE_PROVIDER_SITE_OTHER): Payer: Medicare HMO | Admitting: Neurology

## 2021-10-08 DIAGNOSIS — G4733 Obstructive sleep apnea (adult) (pediatric): Secondary | ICD-10-CM

## 2021-10-08 DIAGNOSIS — S069X1S Unspecified intracranial injury with loss of consciousness of 30 minutes or less, sequela: Secondary | ICD-10-CM

## 2021-10-08 DIAGNOSIS — G4711 Idiopathic hypersomnia with long sleep time: Secondary | ICD-10-CM

## 2021-10-09 NOTE — Progress Notes (Signed)
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 He underwent radiation therapy about 24-18 months ago, and he reports being excessively daytime sleepy.  His is a hypersomnia with long sleep time, up to 12 hours some days. He reports that he wanted to follow 2 sports games on TV and fell asleep.  He does not report any visual hallucinations. No recent falls.    Epworth sleepiness score: 14 /24.   BMI: 25 kg/m   Neck Circumference: 16"   FINDINGS:   Sleep Summary:   Total Recording Time (hours, min): Total recorded time was 8 hours 56 minutes of which the total calculated sleep time was 7 hours and 46 minutes, with 6% REM sleep.                                Respiratory Indices:   Calculated pAHI (per hour): 20.8/h                            REM and NREM pAHI cannot be safely calculated when the REM sleep proportion is under 10%                             Positional AHI: Supine positional AHI was 21.4, sleep on the right was associated with an AHI of 6.3/h.                                                  Oxygen Saturation Statistics:  O2 Saturation Range (%):    Between a nadir of 85% and a maximum saturation at 99% there was a mean saturation of 93% oxygen.                                   O2 Saturation (minutes) <89%:   0.4 minutes        Pulse Rate Statistics:     Pulse Range: Between 57 and 93 bpm with a mean heart rate of 73 bpm.               IMPRESSION:  This HST confirms the presence of moderate sleep apnea presumed to be obstructive in origin.  An AHI of 20.8 was strongly associated with supine sleep position.   RECOMMENDATION:I  recommend to avoid supine sleep and would certainly  consider auto titration CPAP.   This would be more of a CPAP trial as I am not sure that the patient will respond to CPAP so that hypersomnia would improve.  If Johnathan Sosa is willing to try CPAP , I would order an autotitration device for him, 5-15 cm water, 2 cm EPR and mask of his choice, heated humidification.  Alternatively,a dental device can be used in non-REM sleep dependent apnea without hypoxia.     INTERPRETING PHYSICIAN:   Larey Seat, MD   Medical  Director of [employer] at [employer].

## 2021-10-26 NOTE — Procedures (Signed)
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 He underwent radiation therapy about 24-18 months ago, and he reports being excessively daytime sleepy.  His is a hypersomnia with long sleep time, up to 12 hours some days. He reports that he wanted to follow 2 sports games on TV and fell asleep.  He does not report any visual hallucinations. No recent falls.    Epworth sleepiness score: 14 /24.   BMI: 25 kg/m   Neck Circumference: 16"   FINDINGS:   Sleep Summary:   Total Recording Time (hours, min): Total recorded time was 8 hours 56 minutes of which the total calculated sleep time was 7 hours and 46 minutes, with 6% REM sleep.                                Respiratory Indices:   Calculated pAHI (per hour): 20.8/h                            REM and NREM pAHI cannot be safely calculated when the REM sleep proportion is under 10%                             Positional AHI: Supine positional AHI was 21.4, sleep on the right was associated with an AHI of 6.3/h.                                                  Oxygen Saturation Statistics:  O2 Saturation Range (%):    Between a nadir of 85% and a maximum saturation at 99% there was a mean saturation of 93% oxygen.                                   O2 Saturation (minutes) <89%:   0.4 minutes        Pulse Rate Statistics:     Pulse Range: Between 57 and 93 bpm with a mean heart rate of 73 bpm.               IMPRESSION:  This HST confirms the presence of moderate sleep apnea presumed to be obstructive in origin.  An AHI of 20.8 was strongly associated with supine sleep position.   RECOMMENDATION:I  recommend to avoid supine sleep and would certainly consider auto  titration CPAP.   This would be more of a CPAP trial as I am not sure that the patient will respond to CPAP so that hypersomnia would improve.  If Johnathan Sosa is willing to try CPAP , I would order an autotitration device for him, 5-15 cm water, 2 cm EPR and mask of his choice, heated humidification.  Alternatively,a dental device can be used in non-REM sleep dependent apnea without hypoxia.     INTERPRETING PHYSICIAN:   Larey Seat, MD   Medical Director of [facility].

## 2021-10-26 NOTE — Progress Notes (Signed)
IMPRESSION:  This HST confirms the presence of moderate sleep apnea presumed to be obstructive in origin.  An AHI of 20.8 was strongly associated with supine sleep position.  RECOMMENDATION:I  recommend to avoid supine sleep and would certainly consider auto titration CPAP.   This would be more of a CPAP trial as I am not sure that the patient will respond to CPAP so that hypersomnia would improve.  If Johnathan Sosa is willing to try CPAP , I would order an autotitration device for him, 5-15 cm water, 2 cm EPR and mask of his choice, heated humidification.  Alternatively,a dental device can be used in non-REM sleep dependent apnea without hypoxia.

## 2021-10-27 ENCOUNTER — Telehealth: Payer: Self-pay | Admitting: Neurology

## 2021-10-27 NOTE — Telephone Encounter (Signed)
Called the pt and was able to review the sleep study in detail with the patient. I reviewed the findings and recommendations made by Dr Brett Fairy. Pt was surprised because previous testing did not indicate sleep apnea. This testing was completed 15 + yrs ago. Reviewed treatment options with the pt and he does not want to pursue CPAP at this time. Advised that even though there was a limited time recorded of him sleeping on the right sided, it still showed there was lower apnea then when he was on his back. Reviewed with the patient the pt that sleeping on his side is beneficial. He will wait and reassess and discuss at follow up visit. Pt verbalized understanding. Pt had no questions at this time but was encouraged to call back if questions arise.

## 2021-10-27 NOTE — Telephone Encounter (Signed)
-----   Message from Larey Seat, MD sent at 10/26/2021  7:36 PM EST ----- IMPRESSION:  This HST confirms the presence of moderate sleep apnea presumed to be obstructive in origin.  An AHI of 20.8 was strongly associated with supine sleep position.  RECOMMENDATION:I  recommend to avoid supine sleep and would certainly consider auto titration CPAP.   This would be more of a CPAP trial as I am not sure that the patient will respond to CPAP so that hypersomnia would improve.  If Johnathan Sosa is willing to try CPAP , I would order an autotitration device for him, 5-15 cm water, 2 cm EPR and mask of his choice, heated humidification.  Alternatively,a dental device can be used in non-REM sleep dependent apnea without hypoxia.

## 2021-11-04 ENCOUNTER — Other Ambulatory Visit: Payer: Self-pay | Admitting: Neurology

## 2021-11-04 DIAGNOSIS — S069XAA Unspecified intracranial injury with loss of consciousness status unknown, initial encounter: Secondary | ICD-10-CM

## 2021-11-04 DIAGNOSIS — R42 Dizziness and giddiness: Secondary | ICD-10-CM

## 2021-11-06 NOTE — Telephone Encounter (Deleted)
Called pharmacy as pt is not found in [place] drug registry. Pharamcay tech was not able to find pt in his database, local or statewide. Will be calling pt to confirm.  ?

## 2021-11-10 ENCOUNTER — Encounter: Payer: Self-pay | Admitting: Physician Assistant

## 2021-11-10 ENCOUNTER — Ambulatory Visit (INDEPENDENT_AMBULATORY_CARE_PROVIDER_SITE_OTHER): Payer: Medicare HMO | Admitting: Physician Assistant

## 2021-11-10 VITALS — BP 129/82 | HR 72 | Temp 98.2°F | Ht 74.0 in | Wt 193.8 lb

## 2021-11-10 DIAGNOSIS — H9201 Otalgia, right ear: Secondary | ICD-10-CM | POA: Diagnosis not present

## 2021-11-10 MED ORDER — CIPROFLOXACIN HCL 0.2 % OT SOLN: 0.2000 mL | Freq: Two times a day (BID) | OTIC | 0 refills | Status: DC
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
## 2021-11-10 NOTE — Patient Instructions (Signed)
It was great to see you! ? ?I'm going to put in a referral for you to see an ENT for further evaluation ? ?In the meantime, start the ear drop ? ?If any NEW or WORSENING symptoms, please call us  ? ?Contact a health care provider if: ?You have a fever. ?Your ear is still red, swollen, painful, or draining pus after 3 days. ?Your redness, swelling, or pain gets worse. ?You have a severe headache. ?Get help right away if: ?You have redness, swelling, and pain or tenderness in the area behind your ear. ? ?Take care, ? ?[name] PA-C  ?

## 2021-11-11 NOTE — Telephone Encounter (Signed)
Last OV was on 09/09/21.  ?Next OV is scheduled for 03/17/22 .  ?Last RX for clonazepam was written on 07/26/21 for 90 tabs.  ?Last RX for Xanax was written on 07/26/21 for 60 tabs.   ? ?Pharmacy called and verified. Ok to fill.  ?

## 2021-11-12 ENCOUNTER — Telehealth: Payer: Self-pay | Admitting: Family Medicine

## 2021-11-12 NOTE — Telephone Encounter (Signed)
Pt has not been successful in reaching out ENT provider. Pt left voice mail for Dr Marcelline Deist to call back, I called him today as well provided patients info and phone number to call back - Patient stated he is in a lot of pain - Recommendations?  ?

## 2021-11-12 NOTE — Telephone Encounter (Signed)
Please help him get into ENT-work with Lisa-urgent referral was placed by [facility] ?

## 2021-11-12 NOTE — Telephone Encounter (Signed)
Pt just saw Sam on 03/13. ?

## 2021-11-13 ENCOUNTER — Ambulatory Visit (INDEPENDENT_AMBULATORY_CARE_PROVIDER_SITE_OTHER): Payer: Medicare HMO | Admitting: Family

## 2021-11-13 VITALS — BP 124/78 | HR 86 | Temp 99.3°F | Ht 74.0 in | Wt 196.1 lb

## 2021-11-13 DIAGNOSIS — H6121 Impacted cerumen, right ear: Secondary | ICD-10-CM

## 2021-11-13 DIAGNOSIS — H9201 Otalgia, right ear: Secondary | ICD-10-CM

## 2021-11-13 MED ORDER — AMOXICILLIN-POT CLAVULANATE 875-125 MG PO TABS: 1.0000 | ORAL_TABLET | Freq: Two times a day (BID) | ORAL | 0 refills | Status: DC

## 2021-11-13 NOTE — Telephone Encounter (Signed)
Called and lm on pt vm tcb. Was calling to give him the number to Dr. Marcelline Deist ENT [phone] so he can call and schedule his appointment. Please relay to pt when he CB. ?
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
## 2021-11-13 NOTE — Telephone Encounter (Signed)
Office provided contact information . I called Dr Marcelline Deist myself and left a message for office to call patient back. We have not been successful. Patient came in today to see [facility].  ?

## 2021-11-13 NOTE — Patient Instructions (Addendum)
It was very nice to see you today! ? ?I have sent an antibiotic to your pharmacy to treat a possible ear infection.  ?We will continue to try and contact our ENT, but please go ahead and call other ENT offices in the area to see if you can get an appointment AND if they take your insurance, if yes, let us know where to send the referral there. ? ? ? ?PLEASE NOTE: ? ?If you had any lab tests please let us know if you have not heard back within a few days. You may see your results on MyChart before we have a chance to review them but we will give you a call once they are reviewed by [place]. If we ordered any referrals today, please let us know if you have not heard from their office within the next week.  ? ? ?

## 2021-11-13 NOTE — Telephone Encounter (Signed)
Pt declined triage.  ? ?Pharmacist, community at [place] ?Presenter, broadcasting at [facility] ?Provider [name]- MD ?Contact Type Call ?Who Is Calling Patient / Member / Family / Caregiver ?Caller Name Ryann Leavitt ' ?Caller Phone Number [phone] ?Patient Name Johnathan Sosa ' ?Patient DOB 07/12/1946 ?Call Type Message Only Information Provided ?Reason for Call Request for General Office Information ?Initial Comment Caller was seen Monday for an ear ache and had some wax removed but were unable to get ?it all. He was to be referred to a specialist but has not gotten any responses. Says his ear is ?hurting so bad it is hurting in his jaw, teeth and neck and hurts worse that his cancer tx. Please ?call ASAP. Declined triage. ?Disp. Time Disposition Final User ?11/12/2021 9:27:29 PM General Information Provided Yes Kathlynn Grate ?Call Closed By: Kathlynn Grate ?Transaction Date/Time: 11/12/2021 9:22:16 PM (ET) ?

## 2021-11-14 NOTE — Telephone Encounter (Signed)
Patient states he has received a vm from [facility] stating he would be referred to a different ENT.  Patient states Johnathan Sosa has that info.  I do not see any notes with this or referral info where this referral has been sent to a different ENT.    Patient is requesting call back in regard from [facility].

## 2021-11-18 ENCOUNTER — Ambulatory Visit (INDEPENDENT_AMBULATORY_CARE_PROVIDER_SITE_OTHER): Payer: Medicare HMO | Admitting: Family Medicine

## 2021-11-18 ENCOUNTER — Encounter: Payer: Self-pay | Admitting: Family Medicine

## 2021-11-18 VITALS — BP 130/70 | HR 88 | Temp 99.7°F | Resp 97 | Ht 74.0 in | Wt 197.1 lb

## 2021-11-18 DIAGNOSIS — H9201 Otalgia, right ear: Secondary | ICD-10-CM

## 2021-11-18 MED ORDER — PREDNISONE 20 MG PO TABS: ORAL_TABLET | ORAL | 0 refills | Status: AC

## 2021-11-18 NOTE — Telephone Encounter (Signed)
Called and spoke with pt and made him aware that the referral was placed to Dr. Marcelline Deist ENT and also made him aware that all ENTs are booked out very far and we have no control over that. I informed pt that I was not aware of a different referral for ENT being placed, pt then stated he would like to come back in to see any provider here and I informed pt I would have someone reach out to him to get scheduled. ?
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
## 2021-11-18 NOTE — Telephone Encounter (Signed)
Pt scheduled to see Dr. Cherlynn Kaiser today.  ?

## 2021-11-18 NOTE — Telephone Encounter (Signed)
Pt called in upset stating he is in extreme pain and is wanting something done asap. I tried to call the ent specialist again but still no response. He stated Bevelyn Ngo had another specialist in mind. Please advise ?

## 2021-11-18 NOTE — Patient Instructions (Addendum)
It was very nice to see you today! ? ?Call around different ENT docs and see if you can get in ?Take the prednisone  Hopefully helps ? ?

## 2021-12-11 ENCOUNTER — Telehealth: Payer: Self-pay | Admitting: Pharmacist

## 2021-12-11 NOTE — Progress Notes (Signed)
? ? ?Chronic Care Management ?Pharmacy Assistant  ? ?Name: Johnathan Sosa  MRN: [id number] DOB: 1946/04/01 ? ?Reason for Encounter: General Adherence Call ?  ? ?Recent office visits:  ?11/18/2021 OV (Family Medicine) Tawnya Crook, MD;  R ear pain-has been on augmentin and ear lavage.  TM abn-some dec hearing.  Advised to call ENT's and see if can get in.  Will add pred.   Consider MRI if continues. ? ?11/13/2021 OV (Family Medicine) [name], NP; seen 2 days ago for same issue, could not afford ear drops, wants [place] to lavage ear again and thinks if we get all wax out he will be ok. Verbal consent received, pt tolerated lavage well, but unable to remove deep whitish debris. pt c/o pain on right side of face and bad headache. Will send augmentin as may have hidden otitis media. He has left messages w/[facility], and hasn't heard back, advised to call other offices in town, if they accept ins he can let me know to send another referral. ? ?11/10/2021 OV (Family Medicine) [name], [place]; Will trial topical cipro drops for possible otitis externa and place urgent referral to ENT for evaluation/possible cleaning and further assessment ? ?08/21/2021 OV (PCP) Marin Olp, MD; I think it is best to discuss with Dr. Brett Fairy in regards to your alprazolam medication. Since you feel dizziness is better ideally would slowly eliminate medication ? ?Recent consult visits:  ?09/09/2021 OV (Neurology) [name], Asencion Partridge, MD; I advised patient that I was concerned about him taking Xanax consistently twice a day, latest at 3 Pm-  in addition to Klonopin at bedtime. He had insomnia in the past, he may no longer need the klonopin.  ? ?Hospital visits:  ?None in previous 6 months ? ?Medications: ?Outpatient Encounter Medications as of 12/11/2021  ?Medication Sig  ? allopurinol (ZYLOPRIM) 300 MG tablet Take 1 tablet (300 mg total) by mouth daily.  ? ALPRAZolam (XANAX) 0.25 MG tablet TAKE ONE TABLET BY MOUTH TWICE A DAY AS  NEEDED FOR VERITGO *DO NOT TAKE MORE THAN 2 TABLETS DAILY-DO NOT TAKE AFTER 3PM*  ? amLODipine (NORVASC) 2.5 MG tablet Take 1 tablet (2.5 mg total) by mouth daily.  ? amoxicillin-clavulanate (AUGMENTIN) 875-125 MG tablet Take 1 tablet by mouth 2 (two) times daily after a meal.  ? buPROPion (WELLBUTRIN XL) 300 MG 24 hr tablet Take 1 tablet (300 mg total) by mouth daily.  ? citalopram (CELEXA) 10 MG tablet Take 1 tablet (10 mg total) by mouth daily. Per Dr. Brett Fairy per patient  ? clonazePAM (KLONOPIN) 1 MG tablet TAKE 1 TABLET BY MOUTH DAILY AT BEDTIME AS NEEDED. DO NOT DRIVE FOR 12 HOURS AFTER TAKING OR TAKE ALPRAZOLAM WITHIN 8 HOURS OF TAKING.  ? rosuvastatin (CRESTOR) 10 MG tablet Take 1 tablet (10 mg total) by mouth daily.  ? vitamin B-12 (CYANOCOBALAMIN) 1000 MCG tablet Take 2,000 mcg by mouth daily.  ? VITAMIN D PO Take 800 Units by mouth daily in the afternoon.  ? Vitamin D, Ergocalciferol, (DRISDOL) 1.25 MG (50000 UNIT) CAPS capsule Take 1 capsule (50,000 Units total) by mouth every 7 (seven) days.  ? ?No facility-administered encounter medications on file as of 12/11/2021.  ? ?Patient Questions: ?Have you had any problems recently with your health? ? ?Have you had any problems with your pharmacy? ? ?What issues or side effects are you having with your medications? ? ?What would you like me to pass along to Leata Mouse, CPP for him to help you with?  ? ?  What can we do to take care of you better? ? ?**Unable to reach patient to complete this call.** ? ?Care Gaps: ?Medicare Annual Wellness: Scheduled 12/25/2021 ?Hemoglobin A1C: 5.3% on 07/30/2020 ?Colonoscopy: Completed 01/16/2019 ? ?Future Appointments  ?Date Time Provider [place]  ?12/25/2021  2:30 PM [facility]  ?02/26/2022  3:00 PM Marin Olp, MD [facility]  ?03/17/2022  2:30 PM [name], NP [facility] None  ? ?Star Rating Drugs: ?Rosuvastatin last filled 07/19/2021 90 DS ? ?April D Calhoun, [place] ?Clinical Pharmacist  Assistant ?[phone] ?

## 2021-12-25 ENCOUNTER — Ambulatory Visit (INDEPENDENT_AMBULATORY_CARE_PROVIDER_SITE_OTHER): Payer: Medicare PPO

## 2021-12-25 VITALS — BP 118/70 | HR 88 | Temp 98.6°F | Wt 197.2 lb

## 2021-12-25 DIAGNOSIS — Z0000 Encounter for general adult medical examination without abnormal findings: Secondary | ICD-10-CM | POA: Diagnosis not present
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
## 2021-12-25 NOTE — Patient Instructions (Signed)
Johnathan Sosa , ?Thank you for taking time to come for your Medicare Wellness Visit. I appreciate your ongoing commitment to your health goals. Please review the following plan we discussed and let me know if I can assist you in the future.  ? ?Screening recommendations/referrals: ?Colonoscopy: Done 01/16/19 repeat every 5 year  ?Recommended yearly ophthalmology/optometry visit for glaucoma screening and checkup ?Recommended yearly dental visit for hygiene and checkup ? ?Vaccinations: ?Influenza vaccine: Done 07/10/21 repeat every year  ?Pneumococcal vaccine: Up to date ?Tdap vaccine: Done 02/05/15 repeat every 10 years  ?Shingles vaccine:Shingrix discussed. Please contact your pharmacy for coverage information.    ?Covid-19: Completed 3/4, 3/30, 06/08/20 & 06/18/21 ? ?Advanced directives: Advance directive discussed with you today. Even though you declined this today please call our office should you change your mind and we can give you the proper paperwork for you to fill out. ? ?Conditions/risks identified: get back to working out  ? ?Next appointment: Follow up in one year for your annual wellness visit.  ? ?Preventive Care 8 Years and Older, Male ?Preventive care refers to lifestyle choices and visits with your health care provider that can promote health and wellness. ?What does preventive care include? ?A yearly physical exam. This is also called an annual well check. ?Dental exams once or twice a year. ?Routine eye exams. Ask your health care provider how often you should have your eyes checked. ?Personal lifestyle choices, including: ?Daily care of your teeth and gums. ?Regular physical activity. ?Eating a healthy diet. ?Avoiding tobacco and drug use. ?Limiting alcohol use. ?Practicing safe sex. ?Taking low doses of aspirin every day. ?Taking vitamin and mineral supplements as recommended by your health care provider. ?What happens during an annual well check? ?The services and screenings done by your health care  provider during your annual well check will depend on your age, overall health, lifestyle risk factors, and family history of disease. ?Counseling  ?Your health care provider may ask you questions about your: ?Alcohol use. ?Tobacco use. ?Drug use. ?Emotional well-being. ?Home and relationship well-being. ?Sexual activity. ?Eating habits. ?History of falls. ?Memory and ability to understand (cognition). ?Work and work Statistician. ?Screening  ?You may have the following tests or measurements: ?Height, weight, and BMI. ?Blood pressure. ?Lipid and cholesterol levels. These may be checked every 5 years, or more frequently if you are over 71 years old. ?Skin check. ?Lung cancer screening. You may have this screening every year starting at age 44 if you have a 30-pack-year history of smoking and currently smoke or have quit within the past 15 years. ?Fecal occult blood test (FOBT) of the stool. You may have this test every year starting at age 34. ?Flexible sigmoidoscopy or colonoscopy. You may have a sigmoidoscopy every 5 years or a colonoscopy every 10 years starting at age 56. ?Prostate cancer screening. Recommendations will vary depending on your family history and other risks. ?Hepatitis C blood test. ?Hepatitis B blood test. ?Sexually transmitted disease (STD) testing. ?Diabetes screening. This is done by checking your blood sugar (glucose) after you have not eaten for a while (fasting). You may have this done every 1-3 years. ?Abdominal aortic aneurysm (AAA) screening. You may need this if you are a current or former smoker. ?Osteoporosis. You may be screened starting at age 55 if you are at high risk. ?Talk with your health care provider about your test results, treatment options, and if necessary, the need for more tests. ?Vaccines  ?Your health care provider may recommend certain vaccines,  such as: ?Influenza vaccine. This is recommended every year. ?Tetanus, diphtheria, and acellular pertussis (Tdap, Td)  vaccine. You may need a Td booster every 10 years. ?Zoster vaccine. You may need this after age 52. ?Pneumococcal 13-valent conjugate (PCV13) vaccine. One dose is recommended after age 28. ?Pneumococcal polysaccharide (PPSV23) vaccine. One dose is recommended after age 2. ?Talk to your health care provider about which screenings and vaccines you need and how often you need them. ?This information is not intended to replace advice given to you by your health care provider. Make sure you discuss any questions you have with your health care provider. ?Document Released: 09/13/2015 Document Revised: 05/06/2016 Document Reviewed: 06/18/2015 ?Elsevier Interactive Patient Education ? 2017 [place]. ? ?Fall Prevention in the Home ?Falls can cause injuries. They can happen to people of all ages. There are many things you can do to make your home safe and to help prevent falls. ?What can I do on the outside of my home? ?Regularly fix the edges of walkways and driveways and fix any cracks. ?Remove anything that might make you trip as you walk through a door, such as a raised step or threshold. ?Trim any bushes or trees on the path to your home. ?Use bright outdoor lighting. ?Clear any walking paths of anything that might make someone trip, such as rocks or tools. ?Regularly check to see if handrails are loose or broken. Make sure that both sides of any steps have handrails. ?Any raised decks and porches should have guardrails on the edges. ?Have any leaves, snow, or ice cleared regularly. ?Use sand or salt on walking paths during winter. ?Clean up any spills in your garage right away. This includes oil or grease spills. ?What can I do in the bathroom? ?Use night lights. ?Install grab bars by the toilet and in the tub and shower. Do not use towel bars as grab bars. ?Use non-skid mats or decals in the tub or shower. ?If you need to sit down in the shower, use a plastic, non-slip stool. ?Keep the floor dry. Clean up any  water that spills on the floor as soon as it happens. ?Remove soap buildup in the tub or shower regularly. ?Attach bath mats securely with double-sided non-slip rug tape. ?Do not have throw rugs and other things on the floor that can make you trip. ?What can I do in the bedroom? ?Use night lights. ?Make sure that you have a light by your bed that is easy to reach. ?Do not use any sheets or blankets that are too big for your bed. They should not hang down onto the floor. ?Have a firm chair that has side arms. You can use this for support while you get dressed. ?Do not have throw rugs and other things on the floor that can make you trip. ?What can I do in the kitchen? ?Clean up any spills right away. ?Avoid walking on wet floors. ?Keep items that you use a lot in easy-to-reach places. ?If you need to reach something above you, use a strong step stool that has a grab bar. ?Keep electrical cords out of the way. ?Do not use floor polish or wax that makes floors slippery. If you must use wax, use non-skid floor wax. ?Do not have throw rugs and other things on the floor that can make you trip. ?What can I do with my stairs? ?Do not leave any items on the stairs. ?Make sure that there are handrails on both sides of the stairs and  use them. Fix handrails that are broken or loose. Make sure that handrails are as long as the stairways. ?Check any carpeting to make sure that it is firmly attached to the stairs. Fix any carpet that is loose or worn. ?Avoid having throw rugs at the top or bottom of the stairs. If you do have throw rugs, attach them to the floor with carpet tape. ?Make sure that you have a light switch at the top of the stairs and the bottom of the stairs. If you do not have them, ask someone to add them for you. ?What else can I do to help prevent falls? ?Wear shoes that: ?Do not have high heels. ?Have rubber bottoms. ?Are comfortable and fit you well. ?Are closed at the toe. Do not wear sandals. ?If you use a  stepladder: ?Make sure that it is fully opened. Do not climb a closed stepladder. ?Make sure that both sides of the stepladder are locked into place. ?Ask someone to hold it for you, if possible. ?Clearly mark an

## 2022-01-18 ENCOUNTER — Other Ambulatory Visit: Payer: Self-pay | Admitting: Neurology

## 2022-01-18 ENCOUNTER — Other Ambulatory Visit: Payer: Self-pay | Admitting: Family Medicine

## 2022-01-23 ENCOUNTER — Telehealth: Payer: Self-pay | Admitting: Family Medicine

## 2022-01-23 ENCOUNTER — Other Ambulatory Visit: Payer: Self-pay | Admitting: *Deleted

## 2022-01-23 ENCOUNTER — Other Ambulatory Visit: Payer: Self-pay | Admitting: Family Medicine

## 2022-01-23 ENCOUNTER — Other Ambulatory Visit: Payer: Self-pay | Admitting: Neurology

## 2022-01-23 DIAGNOSIS — R42 Dizziness and giddiness: Secondary | ICD-10-CM

## 2022-01-23 MED ORDER — PREDNISONE 20 MG PO TABS: 20.0000 mg | ORAL_TABLET | Freq: Every day | ORAL | 0 refills | Status: AC

## 2022-01-23 NOTE — Telephone Encounter (Signed)
PT states the ear pain has come back and would like to have a refill of the Rx that worked at the last visit (info below.)   Pt decline an appointment because he is reluctant to drive with the dizziness this causes and pharmacy is closer to residence.  LAST APPOINTMENT DATE:   11/18/21   NEXT APPOINTMENT DATE: N/a   MEDICATION: predniSONE (DELTASONE) 20 MG tablet [[id number]]   Is the patient out of medication?  yes   PHARMACY: [facility] [id number] [place]  [place], [place] [address]  Phone:  [phone]  Fax:  [phone]

## 2022-01-23 NOTE — Telephone Encounter (Signed)
Pt called back regarding this request. Front office rep explained that a Rx would be sent in today (05/26) but that pt needs to come in for re-evaluation.   Pt is scheduled for follow up with PCP 01/30/22

## 2022-01-23 NOTE — Telephone Encounter (Signed)
See below, ok to fill under your name?

## 2022-01-23 NOTE — Telephone Encounter (Signed)
Rx sent to pharmacy per Dr. Cherlynn Kaiser: send in 20 mg for 5 days. Patient notified and verbalized understanding.

## 2022-01-23 NOTE — Telephone Encounter (Signed)
Pt is needing 2 more refills that were previously prescribed by another provider. Please advise  .[name] Kitchen Encourage patient to contact the pharmacy for refills or they can request refills through [facility] [facility]:  08/21/21  NEXT APPOINTMENT DATE: 01/30/22  MEDICATION:clonazePAM (KLONOPIN) 1 MG tablet  ALPRAZolam (XANAX) 0.25 MG tablet  Is the patient out of medication?   PHARMACY: [facility] [id number] [place], [place] Phone:  [phone]  Fax:  [phone]      Let patient know to contact pharmacy at the end of the day to make sure medication is ready.  Please notify patient to allow 48-72 hours to process

## 2022-01-23 NOTE — Telephone Encounter (Signed)
Please advise 

## 2022-01-23 NOTE — Telephone Encounter (Signed)
No I will not fill alprazolam- I will only fill clonazepam for sleep

## 2022-01-27 NOTE — Telephone Encounter (Signed)
Called and lm for pt tcb, please give below message when pt calls back.

## 2022-01-28 ENCOUNTER — Telehealth: Payer: Self-pay | Admitting: Family Medicine

## 2022-01-28 NOTE — Telephone Encounter (Signed)
Pt states he is needing to get lab work done that was supposed to be completed last year. He is also requesting PH Urine, D-diameter (?), and complete nutritional panel. Please advise

## 2022-01-28 NOTE — Telephone Encounter (Signed)
Future refills from Dr. Brett Fairy.

## 2022-01-29 NOTE — Telephone Encounter (Signed)
See below, ok to order?

## 2022-01-29 NOTE — Telephone Encounter (Signed)
It appears he has a visit tomorrow with a lab visit afterwards-I am not sure which labs he is requesting in particular but we can discuss at visit tomorrow before his labs-just make sure he comes for his visit with me

## 2022-01-30 ENCOUNTER — Ambulatory Visit (INDEPENDENT_AMBULATORY_CARE_PROVIDER_SITE_OTHER): Payer: Medicare PPO | Admitting: Family Medicine

## 2022-01-30 ENCOUNTER — Encounter: Payer: Self-pay | Admitting: Family Medicine

## 2022-01-30 ENCOUNTER — Other Ambulatory Visit: Payer: Medicare PPO

## 2022-01-30 VITALS — BP 102/64 | HR 78 | Temp 98.7°F | Ht 74.0 in | Wt 199.2 lb

## 2022-01-30 DIAGNOSIS — M10079 Idiopathic gout, unspecified ankle and foot: Secondary | ICD-10-CM

## 2022-01-30 DIAGNOSIS — H9201 Otalgia, right ear: Secondary | ICD-10-CM

## 2022-01-30 DIAGNOSIS — F3342 Major depressive disorder, recurrent, in full remission: Secondary | ICD-10-CM | POA: Diagnosis not present

## 2022-01-30 DIAGNOSIS — E559 Vitamin D deficiency, unspecified: Secondary | ICD-10-CM | POA: Diagnosis not present

## 2022-01-30 DIAGNOSIS — H9191 Unspecified hearing loss, right ear: Secondary | ICD-10-CM | POA: Diagnosis not present

## 2022-01-30 DIAGNOSIS — E538 Deficiency of other specified B group vitamins: Secondary | ICD-10-CM

## 2022-01-30 DIAGNOSIS — E785 Hyperlipidemia, unspecified: Secondary | ICD-10-CM | POA: Diagnosis not present

## 2022-01-30 DIAGNOSIS — I10 Essential (primary) hypertension: Secondary | ICD-10-CM | POA: Diagnosis not present

## 2022-01-30 DIAGNOSIS — I700 Atherosclerosis of aorta: Secondary | ICD-10-CM | POA: Diagnosis not present

## 2022-01-30 MED ORDER — AMOXICILLIN-POT CLAVULANATE 875-125 MG PO TABS: 1.0000 | ORAL_TABLET | Freq: Two times a day (BID) | ORAL | 0 refills | Status: AC

## 2022-01-30 NOTE — Progress Notes (Signed)
Phone [phone] In person visit   Subjective:   Johnathan Sosa is a 76 y.o. year old very pleasant male patient who presents for/with See problem oriented charting Chief Complaint  Patient presents with   Follow-up    Pt is here to f/u on ear pain and would like full lab workup.   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    Priority: High   Memory loss     Priority: High   Vitamin B12 deficiency 01/25/2020    Priority: Medium    Vitamin D deficiency 01/25/2020    Priority: Medium    History of adenomatous polyp of colon 01/25/2019    Priority: Medium    Aortic atherosclerosis ([place]) 12/21/2017    Priority: Medium    BPPV (benign paroxysmal positional vertigo) 12/21/2016    Priority: Medium    Attention deficit hyperactivity disorder (ADHD) 07/06/2016    Priority: Medium    Gout     Priority: Medium    Insomnia     Priority: Medium    Hyperlipidemia     Priority: Medium    Major depression in full remission ([place])     Priority: Medium    HTN (hypertension) 04/29/2012    Priority: Medium    Former smoker 10/01/2015    Priority: Low   Erectile dysfunction 10/01/2015    Priority: Low   Migraine headache     Priority: Low   Abnormal eye movements 10/24/2014    Priority: Low   Spells of speech arrest 10/24/2014    Priority: Low   GERD (gastroesophageal reflux disease)     Priority: Low   Snoring 09/09/2021   Hypersomnia with long sleep time, idiopathic 09/09/2021   Ataxia 08/05/2020   Generalized hyperreflexia 08/05/2020   Vibration  sensory loss 08/05/2020   Recurrent falls while walking 02/06/2019   MCI (mild cognitive impairment) with memory loss 02/06/2019   Hypersomnia with sleep apnea 02/06/2019   Excessive daytime sleepiness 06/07/2018   MCI (mild cognitive impairment) 06/07/2018   Fatigue due to depression 06/07/2018   Ileus ([place]) 09/04/2017   Screening for prostate cancer 10/16/2016   Palpitations 07/20/2016    Medications- reviewed and updated Current Outpatient Medications  Medication Sig Dispense Refill   allopurinol (ZYLOPRIM) 300 MG tablet TAKE ONE TABLET BY MOUTH DAILY 90 tablet 3   ALPRAZolam (XANAX) 0.25 MG tablet TAKE ONE TABLET BY MOUTH TWO TIMES A DAY AS NEEDED FOR VERTIGO. DO NOT TAKE MORE THAN TWO TABLETS DAILY. DO NOT TAKE AFTER 3PM. 60 tablet 0   amLODipine (NORVASC) 2.5 MG tablet TAKE ONE TABLET BY MOUTH DAILY 90 tablet 3   Ascorbic Acid (VITAMIN C PO) Take by mouth.     buPROPion (WELLBUTRIN XL) 300 MG 24 hr tablet Take 1 tablet (300 mg total) by mouth daily. 90 tablet 2   citalopram (CELEXA) 10 MG tablet TAKE ONE TABLET BY MOUTH DAILY 90 tablet 2   clonazePAM (KLONOPIN) 1 MG tablet TAKE ONE TABLET BY MOUTH EVERY NIGHT AT BEDTIME AS NEEDED  DO NOT DRIVE FOR 12 HOURS AFTER TAKING OR TAKE ALPRAZOLAM WITHIN 8 HOURS OF TAKING 30 tablet 0   rosuvastatin (CRESTOR) 10 MG tablet TAKE ONE TABLET BY MOUTH DAILY 90 tablet 3   vitamin B-12 (CYANOCOBALAMIN) 1000 MCG tablet Take 2,000 mcg by mouth daily.     VITAMIN D PO Take 800 Units by mouth daily in the afternoon.     No current facility-administered medications for this visit.     Objective:  BP 102/64   Pulse 78   Temp 98.7 F (37.1 C)   Ht '6\' 2"'$  (1.88 m)   Wt 199 lb 3.2 oz (90.4 kg)   SpO2 96%   BMI 25.58 kg/m  Gen: NAD, resting comfortably Right ear erythematous with cloudiness and bulging- left TM normal CV: RRR no murmurs rubs or gallops Lungs: CTAB no crackles, wheeze, rhonchi Ext: no edema Skin: warm, dry     Assessment and  Plan   # Right ear pain/hearing loss S: Patient was seen in March for right ear pain at our office 3 times within 8 days-had been on Augmentin and had ear lavage.  Concern for infection as well as inflammation-later started on prednisone.  Reports some decreased hearing and was advised to see ENT (referred 11/11/21) with plans to consider MRI if not improving  He states still getting discharge mixed with blood. Hearing has significantly decreased. No tinnititus A/P: patient reports significant hearing loss on right ear. Will refer back to ENT- encouraged him to keep visit even if takes 2-3 months to get in.  - right ear appears infected - will treat augmentin   #hypertension S: medication: amlodipine 2.5 mg BP Readings from Last 3 Encounters:  01/30/22 102/64  12/25/21 118/70  11/18/21 130/70  A/P: Controlled. Continue current medications.   #hyperlipidemia with aortic atherosclerosis S: Medication:rosuvastatin 10 mg  Lab Results  Component Value Date   CHOL 147 02/18/2021   HDL 36.70 (L) 02/18/2021   LDLCALC 71 10/08/2017   LDLDIRECT 90.0 02/18/2021   TRIG 246.0 (H) 02/18/2021   CHOLHDL 4 02/18/2021   A/P: close to ideal control- continue current meds- update lipids Aortic atherosclerosis presumed stable  #Gout S: 0 flares recently on allopurinol '300mg'$  Lab Results  Component Value Date   LABURIC 4.2 01/24/2020  A/P:Controlled with no flares. Continue current medications.    # Depression S: Medication:wellbutrin '300mg'$  XR, citalopram 10 mg, clonazepam 1 mg  before bed - discussed using half (he states he is taking that)    01/30/2022    8:06 AM 12/25/2021    2:35 PM 08/21/2021    3:15 PM  Depression screen PHQ 2/9  Decreased Interest 0 0 0  Down, Depressed, Hopeless '1 1 1  '$ PHQ - 2 Score '1 1 1  '$ Altered sleeping 0  0  Tired, decreased energy 0  2  Change in appetite 0  0  Feeling bad or failure about yourself  0  0  Trouble concentrating 0  0  Moving slowly or  fidgety/restless 0  0  Suicidal thoughts 0  0  PHQ-9 Score 1  3  Difficult doing work/chores Not difficult at all  Somewhat difficult  A/P: depression well controlled. Ongoing balance issues/falls managed by neurology- see below- discussed reducing clonazepam further but he states does not sleep if misses dose well at all  #Vitamin D deficiency S: Medication: vitamin D- takes a dose at home but he is not sure of dose Last vitamin D Lab Results  Component  Value Date   VD25OH 26.01 (L) 02/18/2021  A/P: update vitamin D with labs- prior high dose treatment   # Vertigo/dizziness S:medication: Long-term alprazolam 0.25 mg twice daily a as needed prescribed by neurology previously-at last visit was only taking once a day (states still mainly taking once a day).  Since that time he requested to take over medication-in the past I had advised minimizing benzodiazepines especially with history of falls- he declines falls recently -Chronic issue and MRI of the brain 08/17/2020 largely reassuring A/P: ongoing chronic issue- he states balance is worse off the alprazolam but my concern continues to be his falls history (states no recent falls though)- do not see a clear path forward on this- thought about starting by reducing clonazepam but does not sleep at all - he declines trial of trazodone- discussed if further falls would need to make this change  #history prostate cancer- follow up planned this summer he reports with Dr. Alinda Money  Recommended follow up: No follow-ups on file. Future Appointments  Date Time Provider [place]  01/30/2022  9:15 AM [facility] LAB [facility]  02/26/2022  3:00 PM Marin Olp, MD [facility]  03/17/2022  2:30 PM [name], NP [facility] None  01/07/2023  2:30 PM [facility] HEALTH COACH [facility]    Lab/Order associations:   ICD-10-CM   1. Hearing loss of right ear, unspecified hearing loss type  H91.91 Ambulatory referral to ENT    2. Right ear pain   H92.01 Ambulatory referral to ENT    3. Vitamin B12 deficiency  E53.8 Vitamin B12    4. Primary hypertension  I10 Comprehensive metabolic panel    CBC with Differential/Platelet    Lipid panel    5. Hyperlipidemia, unspecified hyperlipidemia type  E78.5 Comprehensive metabolic panel    CBC with Differential/Platelet    Lipid panel    6. Vitamin D deficiency  E55.9 Vitamin D (25 hydroxy)    7. Idiopathic gout of foot, unspecified chronicity, unspecified laterality  M10.079 Uric acid    8. Recurrent major depressive disorder, in full remission ([place]) Chronic F33.42     9. Aortic atherosclerosis ([facility]) Chronic I70.0      No orders of the defined types were placed in this encounter.   Return precautions advised.  [name], MD

## 2022-01-30 NOTE — Patient Instructions (Addendum)
Let us know when you have gotten your [facility] vaccine at the pharmacy.  We will call you within two weeks about your referral to ENT. If you do not hear within 2 weeks, give [place] a call.   Schedule a lab visit at the check out desk June 23rd or later so we can do full labwork.  Return for future fasting labs meaning nothing but water after midnight please. Ok to take your medications with water.   Augmentin for right ear- appears to be infected again- sent to [facility]  Recommended follow up: Return in about 4 months (around 06/01/2022) for followup or sooner if needed.Schedule b4 you leave.  -cancel visit with me on June 29th but make sure to get a lab visit sometime after the 23rd

## 2022-02-20 ENCOUNTER — Other Ambulatory Visit (INDEPENDENT_AMBULATORY_CARE_PROVIDER_SITE_OTHER): Payer: Medicare PPO

## 2022-02-20 DIAGNOSIS — E559 Vitamin D deficiency, unspecified: Secondary | ICD-10-CM

## 2022-02-20 DIAGNOSIS — I10 Essential (primary) hypertension: Secondary | ICD-10-CM | POA: Diagnosis not present

## 2022-02-20 DIAGNOSIS — E785 Hyperlipidemia, unspecified: Secondary | ICD-10-CM | POA: Diagnosis not present

## 2022-02-20 DIAGNOSIS — E538 Deficiency of other specified B group vitamins: Secondary | ICD-10-CM

## 2022-02-20 DIAGNOSIS — M10079 Idiopathic gout, unspecified ankle and foot: Secondary | ICD-10-CM | POA: Diagnosis not present

## 2022-02-20 LAB — CBC WITH DIFFERENTIAL/PLATELET
Basophils Absolute: 0.1 10*3/uL (ref 0.0–0.1)
Basophils Relative: 0.8 % (ref 0.0–3.0)
Eosinophils Absolute: 0.4 10*3/uL (ref 0.0–0.7)
Eosinophils Relative: 6 % — ABNORMAL HIGH (ref 0.0–5.0)
HCT: 44.4 % (ref 39.0–52.0)
Hemoglobin: 15.2 g/dL (ref 13.0–17.0)
Lymphocytes Relative: 30.2 % (ref 12.0–46.0)
Lymphs Abs: 2.2 10*3/uL (ref 0.7–4.0)
MCHC: 34.2 g/dL (ref 30.0–36.0)
MCV: 91.6 fl (ref 78.0–100.0)
Monocytes Absolute: 0.7 10*3/uL (ref 0.1–1.0)
Monocytes Relative: 8.8 % (ref 3.0–12.0)
Neutro Abs: 4 10*3/uL (ref 1.4–7.7)
Neutrophils Relative %: 54.2 % (ref 43.0–77.0)
Platelets: 225 10*3/uL (ref 150.0–400.0)
RBC: 4.85 Mil/uL (ref 4.22–5.81)
RDW: 13.1 % (ref 11.5–15.5)
WBC: 7.4 10*3/uL (ref 4.0–10.5)

## 2022-02-20 LAB — COMPREHENSIVE METABOLIC PANEL
ALT: 31 U/L (ref 0–53)
AST: 18 U/L (ref 0–37)
Albumin: 4.3 g/dL (ref 3.5–5.2)
Alkaline Phosphatase: 55 U/L (ref 39–117)
BUN: 23 mg/dL (ref 6–23)
CO2: 28 mEq/L (ref 19–32)
Calcium: 9.9 mg/dL (ref 8.4–10.5)
Chloride: 104 mEq/L (ref 96–112)
Creatinine, Ser: 1.17 mg/dL (ref 0.40–1.50)
GFR: 61.72 mL/min (ref >60.00)
Glucose, Bld: 94 mg/dL (ref 70–99)
Potassium: 3.8 mEq/L (ref 3.5–5.1)
Sodium: 140 mEq/L (ref 135–145)
Total Bilirubin: 0.5 mg/dL (ref 0.2–1.2)
Total Protein: 6.3 g/dL (ref 6.0–8.3)

## 2022-02-20 LAB — LIPID PANEL
Cholesterol: 139 mg/dL (ref 0–200)
HDL: 40 mg/dL (ref >39.00)
LDL Cholesterol: 66 mg/dL (ref 0–99)
NonHDL: 99.26
Total CHOL/HDL Ratio: 3
Triglycerides: 165 mg/dL — ABNORMAL HIGH (ref 0.0–149.0)
VLDL: 33 mg/dL (ref 0.0–40.0)

## 2022-02-20 LAB — VITAMIN B12: Vitamin B-12: 708 pg/mL (ref 211–911)

## 2022-02-20 LAB — VITAMIN D 25 HYDROXY (VIT D DEFICIENCY, FRACTURES): VITD: 29.18 ng/mL — ABNORMAL LOW (ref 30.00–100.00)

## 2022-02-20 LAB — URIC ACID: Uric Acid, Serum: 3.9 mg/dL — ABNORMAL LOW (ref 4.0–7.8)

## 2022-02-26 ENCOUNTER — Encounter: Payer: Medicare HMO | Admitting: Family Medicine
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
## 2022-03-17 ENCOUNTER — Ambulatory Visit: Payer: Medicare PPO | Admitting: Adult Health

## 2022-03-17 ENCOUNTER — Encounter: Payer: Self-pay | Admitting: Adult Health

## 2022-03-17 VITALS — BP 108/77 | HR 83 | Ht 74.0 in | Wt 197.0 lb

## 2022-03-17 DIAGNOSIS — G4711 Idiopathic hypersomnia with long sleep time: Secondary | ICD-10-CM

## 2022-03-17 NOTE — Patient Instructions (Addendum)
Your Plan:  Try weaning off- decrease 1/2 daily then stop Ok to Continue klonopin 1/2 tablet- 1 tablet at bedtime If your symptoms worsen or you develop new symptoms please let us know.        Thank you for coming to see [place] at [facility]. I hope we have been able to provide you high quality care today.  You may receive a patient satisfaction survey over the next few weeks. We would appreciate your feedback and comments so that we may continue to improve ourselves and the health of our patients.

## 2022-03-23 ENCOUNTER — Telehealth: Payer: Self-pay | Admitting: Pharmacist

## 2022-03-23 NOTE — Progress Notes (Unsigned)
Chronic Care Management Pharmacy Assistant   Name: Johnathan Sosa  MRN: [id number] DOB: 01-21-1946   Reason for Encounter: General Adherence Call    Recent office visits:  01/30/2022 OV (PCP) Johnathan Olp, MD; right ear appears infected - will treat augmentin   Recent consult visits:  03/17/2022 OV (Neurology) Johnathan Givens, NP; Patient will try weaning off of Xanax by decreasing his dose to half a tablet daily for 1 week then discontinuing  Hospital visits:  None in previous 6 months  Medications: Outpatient Encounter Medications as of 03/23/2022  Medication Sig   allopurinol (ZYLOPRIM) 300 MG tablet TAKE ONE TABLET BY MOUTH DAILY   amLODipine (NORVASC) 2.5 MG tablet TAKE ONE TABLET BY MOUTH DAILY   Ascorbic Acid (VITAMIN C PO) Take by mouth.   buPROPion (WELLBUTRIN XL) 300 MG 24 hr tablet Take 1 tablet (300 mg total) by mouth daily.   citalopram (CELEXA) 10 MG tablet TAKE ONE TABLET BY MOUTH DAILY   clonazePAM (KLONOPIN) 1 MG tablet TAKE ONE TABLET BY MOUTH EVERY NIGHT AT BEDTIME AS NEEDED DO NOT DRIVE FOR 12 HOURS AFTER TAKING OR TAKE ALPRAZOLAM WITHIN 8 HOURS OF TAKING   rosuvastatin (CRESTOR) 10 MG tablet TAKE ONE TABLET BY MOUTH DAILY   vitamin B-12 (CYANOCOBALAMIN) 1000 MCG tablet Take 2,000 mcg by mouth daily.   VITAMIN D PO Take 800 Units by mouth daily in the afternoon.   No facility-administered encounter medications on file as of 03/23/2022.   Contacted Johnathan Sosa for General Review Call   Chart Review:  Have there been any documented new, changed, or discontinued medications since last visit? {yes/no:20286} (If yes, include name, dose, frequency, date) Has there been any documented recent hospitalizations or ED visits since last visit with Clinical Pharmacist? {yes/no:20286} Brief Summary (including medication and/or Diagnosis changes):   Adherence Review:  Does the Clinical Pharmacist Assistant have access to adherence rates? {yes/no:20286} Adherence  rates for STAR metric medications (List medication(s)/day supply/ last 2 fill dates). Adherence rates for medications indicated for disease state being reviewed (List medication(s)/day supply/ last 2 fill dates). Does the patient have >5 day gap between last estimated fill dates for any of the above medications or other medication gaps? {yes/no:20286} Reason for medication gaps.   Disease State Questions:  Able to connect with Patient? {yes/no:20286} Did patient have any problems with their health recently? {yes/no:20286} Note problems and Concerns: Have you had any admissions or emergency room visits or worsening of your condition(s) since last visit? {yes/no:20286} Details of ED visit, hospital visit and/or worsening condition(s): Have you had any visits with new specialists or providers since your last visit? {yes/no:20286} Explain: Have you had any new health care problem(s) since your last visit? {yes/no:20286} New problem(s) reported: Have you run out of any of your medications since you last spoke with clinical pharmacist? {yes/no:20286} What caused you to run out of your medications? Are there any medications you are not taking as prescribed? {yes/no:20286} What kept you from taking your medications as prescribed? Are you having any issues or side effects with your medications? {yes/no:20286} Note of issues or side effects: Do you have any other health concerns or questions you want to discuss with your Clinical Pharmacist before your next visit? {yes/no:20286} Note additional concerns and questions from Patient. Are there any health concerns that you feel we can do a better job addressing? {yes/no:20286} Note Patient's response. Are you having any problems with any of the following since the last visit: (select all  that apply)  {General Call:27390}  Details: 12. Any falls since last visit? {yes/no:20286}  Details: 13. Any increased or uncontrolled pain since last visit?  {yes/no:20286}  Details: 14. Next visit Type: {Telephone/Office:25179}       Visit with:        Date:        Time:  71. Additional Details? {yes/no:20286}    Care Gaps: Medicare Annual Wellness: Completed 12/25/2021 Hemoglobin A1C: 5.3% on 07/30/2020 Colonoscopy: Completed 01/16/2019  Future Appointments  Date Time Provider Am[place]  06/01/2022  2:00 PM Johnathan Olp, MD [facility]  10/05/2022  2:00 PM Johnathan Givens, NP [facility] None  01/07/2023  2:30 PM [facility] HEALTH COACH [facility]   Star Rating Drugs: Rosuvastatin last filled 11/10/2021 90 DS  April D Calhoun, [place] Pharmacist Assistant [phone]

## 2022-03-24 ENCOUNTER — Telehealth: Payer: Self-pay

## 2022-03-24 NOTE — Telephone Encounter (Signed)
Labs placed up front to be mailed to pt.

## 2022-03-24 NOTE — Telephone Encounter (Signed)
-----   Message from April D Calhoun sent at 03/24/2022  8:05 AM EDT ----- Regarding: Lab Results Patient states he would like a copy of his last lab results mailed to his home address. He states he does not have access to [employer].  Thank you,  April D Calhoun, [place] Pharmacist Assistant [phone]

## 2022-04-18 ENCOUNTER — Other Ambulatory Visit: Payer: Self-pay | Admitting: Family Medicine

## 2022-04-22 ENCOUNTER — Telehealth: Payer: Self-pay | Admitting: Family Medicine

## 2022-04-22 NOTE — Telephone Encounter (Signed)
Patient requests to have D Dimer test and PH testing (both saliva and urine). Patient would be fasting 8 hours prior to testing.  Patient gave phone to [facility] states Patient needs to know his blood type.  Please advise.

## 2022-04-23 NOTE — Telephone Encounter (Signed)
FYI, we don't  do saliva testing here.

## 2022-04-23 NOTE — Telephone Encounter (Signed)
See below, please schedule pt.

## 2022-04-23 NOTE — Telephone Encounter (Signed)
Patient will need an office visit to discuss concerns-bring notes from person he was chatting with today-cannot order before visit as do not have diagnosis and we do not use a lot of testing that I am aware of

## 2022-04-24 NOTE — Telephone Encounter (Signed)
I ordered labs that day and did not order any of the ones that apparently he states he requested.  I do not know what the labs are for so he is incorrect on that point and I am not sure if the labs will be covered-in fact I am not even exactly sure which labs he is requesting.  I do not recall this form and if he did submit it not clear why we did not order that date and would be inconsistent on how I usually test.

## 2022-04-24 NOTE — Telephone Encounter (Signed)
Pt is refusing to come in stating there is no reason for a visit. He requested these labs on 01/28/22 and stated [name] should have ordered them during the 01/30/22 visit or asked him about it. He stated [name] can call him and discuss it.

## 2022-04-24 NOTE — Telephone Encounter (Signed)
See below and please schedule pt for visit if he is still wanting to proceed.

## 2022-04-24 NOTE — Telephone Encounter (Signed)
Called patient to schedule OV as instructed.   Patient states: -He is not understanding why he needs to come in for an OV to discuss this test when "Dr. [name] already knows what I need it for" - He provided a form stating what tests he needed and why during his OV on 01/30/22.  - He discussed this matter with PCP at 06/02 appointment.   Please Advise.

## 2022-04-24 NOTE — Telephone Encounter (Signed)
This was information on phone call 01/28/22 "Pt states he is needing to get lab work done that was supposed to be completed last year. He is also requesting PH Urine, D-diameter (?), and complete nutritional panel. Please advise" -this is not enough information to order these and does not appear patient brought it up as a concern on day of visit   Tammy can you please call patient and tell him I literally do not have enough information to order the requested labs - we can discuss at 06/01/22 visit or sooner if he would like to schedule a visit to discuss. I am not even sure if I will be able to order specific tests he is interested in- I'm not sure where the request for this particular information even came from either based on notes/records

## 2022-04-24 NOTE — Telephone Encounter (Signed)
See below

## 2022-04-27 NOTE — Telephone Encounter (Signed)
Thanks Tammy for your efforts

## 2022-04-27 NOTE — Telephone Encounter (Signed)
Patient would not let me talk nor would try to discuss.  Stating Dr. [name] knows why he wants the test.  I have informed patient that we need to schedule an appointment with Dr. [name] for assessment.  Patient declined.   States he knows our office wants to avoid placing the labs and that he is going elsewhere for testing.

## 2022-06-01 ENCOUNTER — Encounter: Payer: Self-pay | Admitting: Family Medicine

## 2022-06-01 ENCOUNTER — Ambulatory Visit (INDEPENDENT_AMBULATORY_CARE_PROVIDER_SITE_OTHER): Payer: Medicare PPO | Admitting: Family Medicine

## 2022-06-01 VITALS — BP 110/72 | HR 88 | Temp 98.0°F | Ht 74.0 in | Wt 186.4 lb

## 2022-06-01 DIAGNOSIS — F3342 Major depressive disorder, recurrent, in full remission: Secondary | ICD-10-CM

## 2022-06-01 DIAGNOSIS — H9191 Unspecified hearing loss, right ear: Secondary | ICD-10-CM | POA: Diagnosis not present

## 2022-06-01 DIAGNOSIS — E559 Vitamin D deficiency, unspecified: Secondary | ICD-10-CM | POA: Diagnosis not present

## 2022-06-01 DIAGNOSIS — E785 Hyperlipidemia, unspecified: Secondary | ICD-10-CM

## 2022-06-01 DIAGNOSIS — I10 Essential (primary) hypertension: Secondary | ICD-10-CM

## 2022-06-01 DIAGNOSIS — R634 Abnormal weight loss: Secondary | ICD-10-CM

## 2022-06-01 NOTE — Patient Instructions (Addendum)
ENT # and address- can either call or stop by- your referral is still in place from march. I will place new referral- did just now Address: [address], [address], [place], [place] [address] Phone: [phone]  Labs next visit- unless issues with weight loss were to continue  We opted to monitor weight at home over next month- if continues to drop consider unintentional weight loss workup but I really think this will normalize off of the diet he had been placed on for 2 weeks  Recommended follow up: Return in about 6 months (around 12/01/2022) for physical or sooner if needed.Schedule b4 you leave.

## 2022-06-01 NOTE — Progress Notes (Signed)
Phone [phone] In person visit   Subjective:   Johnathan Sosa is a 76 y.o. 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 ([place]) 01/26/2017    Priority: High   History of CVA (cerebrovascular accident) 07/20/2016    Priority: High   Closed TBI (traumatic brain injury) ([place]) 10/24/2014    Priority: High   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    Priority: High   Memory loss     Priority: High   Vitamin B12 deficiency 01/25/2020    Priority: Medium    Vitamin D deficiency 01/25/2020    Priority: Medium    History of adenomatous polyp of colon 01/25/2019    Priority: Medium    Aortic atherosclerosis ([place]) 12/21/2017    Priority: Medium    BPPV (benign paroxysmal positional vertigo) 12/21/2016    Priority: Medium    Attention deficit hyperactivity disorder (ADHD) 07/06/2016    Priority: Medium    Gout     Priority: Medium    Insomnia     Priority: Medium    Hyperlipidemia     Priority: Medium    Major depression in full remission ([place])     Priority: Medium    HTN (hypertension) 04/29/2012    Priority: Medium    Former smoker 10/01/2015    Priority: Low   Erectile dysfunction 10/01/2015    Priority: Low   Migraine headache     Priority: Low   Abnormal eye movements 10/24/2014    Priority: Low   Spells of speech arrest 10/24/2014    Priority: Low   GERD (gastroesophageal reflux disease)     Priority: Low   Snoring 09/09/2021   Hypersomnia with long sleep time, idiopathic 09/09/2021   Ataxia 08/05/2020   Generalized hyperreflexia 08/05/2020    Vibration sensory loss 08/05/2020   Recurrent falls while walking 02/06/2019   MCI (mild cognitive impairment) with memory loss 02/06/2019   Hypersomnia with sleep apnea 02/06/2019   Excessive daytime sleepiness 06/07/2018   MCI (mild cognitive impairment) 06/07/2018   Fatigue due to depression 06/07/2018   Ileus ([place]) 09/04/2017   Screening for prostate cancer 10/16/2016   Palpitations 07/20/2016    Medications- reviewed and updated Current Outpatient Medications  Medication Sig Dispense Refill   allopurinol (ZYLOPRIM) 300 MG tablet TAKE ONE TABLET BY MOUTH DAILY 90 tablet 3   amLODipine (NORVASC) 2.5 MG tablet TAKE ONE TABLET BY MOUTH DAILY 90 tablet 3   Ascorbic Acid (VITAMIN C PO) Take by mouth.     buPROPion (WELLBUTRIN XL) 300 MG 24 hr tablet TAKE ONE TABLET BY MOUTH DAILY 90 tablet 2   citalopram (CELEXA) 10 MG tablet TAKE ONE TABLET BY MOUTH DAILY 90 tablet 2   clonazePAM (KLONOPIN) 1 MG tablet TAKE ONE TABLET BY MOUTH EVERY NIGHT AT BEDTIME AS NEEDED DO NOT DRIVE FOR 12 HOURS AFTER TAKING OR TAKE ALPRAZOLAM WITHIN 8 HOURS OF TAKING 30 tablet 0   rosuvastatin (CRESTOR) 10 MG tablet TAKE ONE TABLET BY MOUTH DAILY 90 tablet 3   vitamin B-12 (CYANOCOBALAMIN)  1000 MCG tablet Take 2,000 mcg by mouth daily.     VITAMIN D PO Take 800 Units by mouth daily in the afternoon.     No current facility-administered medications for this visit.     Objective:  BP 110/72   Pulse 88   Temp 98 F (36.7 C)   Ht '6\' 2"'$  (1.88 m)   Wt 186 lb 6.4 oz (84.6 kg)   SpO2 96%   BMI 23.93 kg/m  Gen: NAD, resting comfortably CV: RRR no murmurs rubs or gallops Lungs: CTAB no crackles, wheeze, rhonchi Ext: no edema Skin: warm, dry     Assessment and Plan   # weight loss S:patient down 14 lbs in 4 months. Previously had similar issues that we thought could be related to inadequately treated depression. Improved with weight up 7 lbs on follow up after starting lexapro on 05/16/18. He had extensive  bloodwork and ct abdomen/pelvis and CXR as well as several months later CT chest - he was working with a Engineer, maintenance who was trying to get him on a diet that moved into his home to help- he later asked her to move out after 2 weeks- apparently this was like a covid vaccination detox which he in retrospect thinks was a poor choice. Moved out 2 weeks ago. He states the weight loss primarily occurred with this diet. She moved in and he was around 192 and was down to 180.  -forcibly eating more and home weights from 180 to 186 since she left 2 weeks ago.  A/P: initially reported as unintentional weight loss but this actually sounds related to detox diet. We opted to monitor weight at home over next month- if continues to drop consider unintentional weight loss workup but I really think this will normalize off of the diet he had been placed on for 2 weeks -psa has been low risk with DR. Borden visit with prior prostate cancer  # Depression S: Medication: citalopram 10 mg- last prescribed by Dr. Brett Fairy, wellbutrin 300 mg XR    06/01/2022    1:39 PM 01/30/2022    8:06 AM 12/25/2021    2:35 PM  Depression screen PHQ 2/9  Decreased Interest 0 0 0  Down, Depressed, Hopeless 0 1 1  PHQ - 2 Score 0 1 1  Altered sleeping 0 0   Tired, decreased energy 0 0   Change in appetite 0 0   Feeling bad or failure about yourself  0 0   Trouble concentrating 0 0   Moving slowly or fidgety/restless 0 0   Suicidal thoughts 0 0   PHQ-9 Score 0 1   Difficult doing work/chores Not difficult at all Not difficult at all   A/P: full remission- continue current meds. Do not think this was cuase of weight loss this time.   #hypertension S: medication: amlodipine 2.5 mg BP Readings from Last 3 Encounters:  06/01/22 110/72  03/17/22 108/77  01/30/22 102/64  A/P: Controlled. Continue current medications.   #hyperlipidemia with history of stroke on prior mri 2017 S: Medication: rosuvastatin 10 mg Lab Results   Component Value Date   CHOL 139 02/20/2022   HDL 40.00 02/20/2022   LDLCALC 66 02/20/2022   LDLDIRECT 90.0 02/18/2021   TRIG 165.0 (H) 02/20/2022   CHOLHDL 3 02/20/2022   A/P: well controlled last visit under 65 for ldl- continue current meds  #Vitamin D deficiency S: Medication: takes OTC- thinks 2000 units Last vitamin D Lab Results  Component Value  Date   VD25OH 29.18 (L) 02/20/2022  A/P: hopefully improved- update vitamin D   #Insomnia- Due to hypersomnia weaned off of alprazolam. He was continued on clonazepam for insomnia  #hearing loss- never heard form ENT- wants new referral  Recommended follow up: Return in about 6 months (around 12/01/2022) for physical or sooner if needed.Schedule b4 you leave. Future Appointments  Date Time Provider [place]  10/05/2022  2:00 PM [name], NP [facility] None  01/07/2023  2:30 PM [facility] HEALTH COACH [facility]   Lab/Order associations:   ICD-10-CM   1. Recurrent major depressive disorder, in full remission ([place])  F33.42     2. Primary hypertension  I10     3. Hyperlipidemia, unspecified hyperlipidemia type  E78.5     4. Unintentional weight loss  R63.4     5. Vitamin D deficiency  E55.9     6. Hearing loss of right ear, unspecified hearing loss type  H91.91       No orders of the defined types were placed in this encounter.   Return precautions advised.  [name], MD

## 2022-06-10 DIAGNOSIS — N130 Hydronephrosis with ureteropelvic junction obstruction: Secondary | ICD-10-CM | POA: Diagnosis not present

## 2022-06-10 DIAGNOSIS — C61 Malignant neoplasm of prostate: Secondary | ICD-10-CM | POA: Diagnosis not present

## 2022-06-10 DIAGNOSIS — R310 Gross hematuria: Secondary | ICD-10-CM | POA: Diagnosis not present

## 2022-06-19 DIAGNOSIS — R319 Hematuria, unspecified: Secondary | ICD-10-CM | POA: Diagnosis not present

## 2022-06-19 DIAGNOSIS — I700 Atherosclerosis of aorta: Secondary | ICD-10-CM | POA: Diagnosis not present

## 2022-06-19 DIAGNOSIS — R310 Gross hematuria: Secondary | ICD-10-CM | POA: Diagnosis not present

## 2022-06-19 DIAGNOSIS — R109 Unspecified abdominal pain: Secondary | ICD-10-CM | POA: Diagnosis not present

## 2022-07-15 DIAGNOSIS — R310 Gross hematuria: Secondary | ICD-10-CM | POA: Diagnosis not present

## 2022-07-15 DIAGNOSIS — N3946 Mixed incontinence: Secondary | ICD-10-CM | POA: Diagnosis not present

## 2022-07-15 DIAGNOSIS — C61 Malignant neoplasm of prostate: Secondary | ICD-10-CM | POA: Diagnosis not present

## 2022-08-19 ENCOUNTER — Other Ambulatory Visit: Payer: Self-pay | Admitting: Neurology

## 2022-10-05 ENCOUNTER — Encounter: Payer: Self-pay | Admitting: Adult Health

## 2022-10-05 ENCOUNTER — Telehealth: Payer: Self-pay | Admitting: *Deleted

## 2022-10-05 ENCOUNTER — Ambulatory Visit: Payer: Medicare PPO | Admitting: Adult Health

## 2022-10-05 VITALS — BP 104/62 | HR 79 | Ht 74.0 in | Wt 195.0 lb

## 2022-10-05 DIAGNOSIS — G4711 Idiopathic hypersomnia with long sleep time: Secondary | ICD-10-CM | POA: Diagnosis not present

## 2022-10-05 NOTE — Telephone Encounter (Signed)
Called Johnathan Sosa. Since pt just picked up 30 day supply of clonazepam, they cannot attempt to fill more this early d/t insurance. I was told they could not tell why 90 days wasn't able to be filled last time but that it might be an insurance restriction new this year. They will place a note on pt's file to try to fill 90 day supply in future if possible.   I called the pt and LVM (ok per DPR) letting him know and asked him to call his pharmacy later but before he runs out of his current fill. Also told him before he pays and picks up the medications he can always check with pharmacy to see what days supply is being filled and they can make any possible changes to it before it is filled. Left office number for call back if needed.
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
## 2022-10-05 NOTE — Patient Instructions (Signed)
Your Plan:  Continue Klonopin 1 mg at bedtime If your symptoms worsen or you develop new symptoms please let us know.    Thank you for coming to see [place] at [facility]. I hope we have been able to provide you high quality care today.  You may receive a patient satisfaction survey over the next few weeks. We would appreciate your feedback and comments so that we may continue to improve ourselves and the health of our patients.

## 2022-10-05 NOTE — Telephone Encounter (Signed)
-----   Message from [name], NP sent at 10/05/2022  2:25 PM EST ----- Please call pharmacy to inquire about the number of tablets of Klonopin.  The patient would like to pick up the other 60 tablets to keep him on track with his other medication.  Patient would like a call on his cell when she has been to the pharmacist

## 2022-10-15 ENCOUNTER — Other Ambulatory Visit: Payer: Self-pay | Admitting: Neurology

## 2022-12-01 ENCOUNTER — Encounter: Payer: Self-pay | Admitting: Family Medicine

## 2022-12-01 ENCOUNTER — Ambulatory Visit (INDEPENDENT_AMBULATORY_CARE_PROVIDER_SITE_OTHER): Payer: Medicare PPO | Admitting: Family Medicine

## 2022-12-01 VITALS — BP 120/68 | HR 73 | Temp 98.0°F | Ht 74.0 in | Wt 194.6 lb

## 2022-12-01 DIAGNOSIS — F3342 Major depressive disorder, recurrent, in full remission: Secondary | ICD-10-CM

## 2022-12-01 DIAGNOSIS — I700 Atherosclerosis of aorta: Secondary | ICD-10-CM

## 2022-12-01 DIAGNOSIS — S069X1S Unspecified intracranial injury with loss of consciousness of 30 minutes or less, sequela: Secondary | ICD-10-CM | POA: Diagnosis not present

## 2022-12-01 DIAGNOSIS — Z0000 Encounter for general adult medical examination without abnormal findings: Secondary | ICD-10-CM

## 2022-12-01 DIAGNOSIS — M10079 Idiopathic gout, unspecified ankle and foot: Secondary | ICD-10-CM | POA: Diagnosis not present

## 2022-12-01 DIAGNOSIS — E785 Hyperlipidemia, unspecified: Secondary | ICD-10-CM

## 2022-12-01 DIAGNOSIS — Z8546 Personal history of malignant neoplasm of prostate: Secondary | ICD-10-CM | POA: Diagnosis not present

## 2022-12-01 DIAGNOSIS — E559 Vitamin D deficiency, unspecified: Secondary | ICD-10-CM

## 2022-12-01 DIAGNOSIS — I10 Essential (primary) hypertension: Secondary | ICD-10-CM

## 2022-12-01 DIAGNOSIS — E538 Deficiency of other specified B group vitamins: Secondary | ICD-10-CM

## 2022-12-01 LAB — CBC WITH DIFFERENTIAL/PLATELET
Basophils Absolute: 0.1 10*3/uL (ref 0.0–0.1)
Basophils Relative: 1 % (ref 0.0–3.0)
Eosinophils Absolute: 0.3 10*3/uL (ref 0.0–0.7)
Eosinophils Relative: 4.2 % (ref 0.0–5.0)
HCT: 42.3 % (ref 39.0–52.0)
Hemoglobin: 14.7 g/dL (ref 13.0–17.0)
Lymphocytes Relative: 22.3 % (ref 12.0–46.0)
Lymphs Abs: 1.6 10*3/uL (ref 0.7–4.0)
MCHC: 34.9 g/dL (ref 30.0–36.0)
MCV: 90.4 fl (ref 78.0–100.0)
Monocytes Absolute: 0.6 10*3/uL (ref 0.1–1.0)
Monocytes Relative: 8.4 % (ref 3.0–12.0)
Neutro Abs: 4.5 10*3/uL (ref 1.4–7.7)
Neutrophils Relative %: 64.1 % (ref 43.0–77.0)
Platelets: 229 10*3/uL (ref 150.0–400.0)
RBC: 4.68 Mil/uL (ref 4.22–5.81)
RDW: 13.2 % (ref 11.5–15.5)
WBC: 7.1 10*3/uL (ref 4.0–10.5)

## 2022-12-01 NOTE — Patient Instructions (Addendum)
Please stop by lab before you go If you have mychart- we will send your results within 3 business days of [place] receiving them.  If you do not have mychart- we will call you about results within 5 business days of [place] receiving them.  *please also note that you will see labs on mychart as soon as they post. I will later go in and write notes on them- will say "notes from Dr. [name]"   Recommended follow up: Return in about 6 months (around 06/02/2023) for followup or sooner if needed.Schedule b4 you leave.
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
## 2022-12-02 LAB — LIPID PANEL
Cholesterol: 146 mg/dL (ref 0–200)
HDL: 39.3 mg/dL (ref >39.00)
NonHDL: 106.68
Total CHOL/HDL Ratio: 4
Triglycerides: 311 mg/dL — ABNORMAL HIGH (ref 0.0–149.0)
VLDL: 62.2 mg/dL — ABNORMAL HIGH (ref 0.0–40.0)

## 2022-12-02 LAB — COMPREHENSIVE METABOLIC PANEL
ALT: 31 U/L (ref 0–53)
AST: 22 U/L (ref 0–37)
Albumin: 4.3 g/dL (ref 3.5–5.2)
Alkaline Phosphatase: 48 U/L (ref 39–117)
BUN: 16 mg/dL (ref 6–23)
CO2: 25 mEq/L (ref 19–32)
Calcium: 9.7 mg/dL (ref 8.4–10.5)
Chloride: 107 mEq/L (ref 96–112)
Creatinine, Ser: 1.21 mg/dL (ref 0.40–1.50)
GFR: 58.95 mL/min — ABNORMAL LOW (ref >60.00)
Glucose, Bld: 95 mg/dL (ref 70–99)
Potassium: 4 mEq/L (ref 3.5–5.1)
Sodium: 139 mEq/L (ref 135–145)
Total Bilirubin: 0.5 mg/dL (ref 0.2–1.2)
Total Protein: 6.2 g/dL (ref 6.0–8.3)

## 2022-12-02 LAB — VITAMIN B12: Vitamin B-12: 403 pg/mL (ref 211–911)

## 2022-12-02 LAB — URIC ACID: Uric Acid, Serum: 4.2 mg/dL (ref 4.0–7.8)

## 2022-12-02 LAB — LDL CHOLESTEROL, DIRECT: Direct LDL: 73 mg/dL

## 2022-12-02 LAB — VITAMIN D 25 HYDROXY (VIT D DEFICIENCY, FRACTURES): VITD: 33.76 ng/mL (ref 30.00–100.00)

## 2022-12-28 ENCOUNTER — Telehealth: Payer: Self-pay | Admitting: Family Medicine

## 2022-12-28 NOTE — Telephone Encounter (Signed)
Copied from CRM [id number]. Topic: Medicare AWV >> Dec 28, 2022  9:07 AM Gwenith Spitz wrote: Reason for CRM: Called patient to reschedule Medicare Annual Wellness Visit (AWV). Left message for patient to call back and reschedule Medicare Annual Wellness Visit (AWV).  Last date of AWV: 0427/2023  Please schedule an appointment at any time with Inetta Fermo, [facility]. Please schedule AWVS with Inetta Fermo, [facility].  If any questions, please contact me at [phone].  Thank you ,  Gabriel Cirri [facility] AWV TEAM Direct Dial [phone]

## 2022-12-29 ENCOUNTER — Telehealth: Payer: Self-pay | Admitting: Family Medicine

## 2022-12-29 NOTE — Telephone Encounter (Signed)
Contacted Johnathan Sosa to schedule their annual wellness visit. Appointment made for 01/11/2023.  Gabriel Cirri [facility] AWV TEAM Direct Dial [phone]

## 2023-01-11 ENCOUNTER — Ambulatory Visit (INDEPENDENT_AMBULATORY_CARE_PROVIDER_SITE_OTHER): Payer: Medicare PPO

## 2023-01-11 VITALS — Wt 194.0 lb

## 2023-01-11 DIAGNOSIS — Z0000 Encounter for general adult medical examination without abnormal findings: Secondary | ICD-10-CM | POA: Diagnosis not present

## 2023-01-11 NOTE — Patient Instructions (Signed)
[name] , Thank you for taking time to come for your Medicare Wellness Visit. I appreciate your ongoing commitment to your health goals. Please review the following plan we discussed and let me know if I can assist you in the future.   These are the goals we discussed:  Goals      Exercise 150 minutes per week (moderate activity)     Lifting weight at Y 3 times a week      Patient Stated     Recover from his recent surgery Control of bladder      Patient Stated     Get back to working out and lifting weights     Patient Stated     Get back to working out      Patient Stated     Get back to lifting weight         This is a list of the screening recommended for you and due dates:  Health Maintenance  Topic Date Due   COVID-19 Vaccine (5 - 2023-24 season) 05/01/2022   Zoster (Shingles) Vaccine (1 of 2) 03/02/2023*   Flu Shot  04/01/2023   Medicare Annual Wellness Visit  01/11/2024   Colon Cancer Screening  01/16/2024   DTaP/Tdap/Td vaccine (2 - Tdap) 02/04/2025   Pneumonia Vaccine  Completed   Hepatitis C Screening: USPSTF Recommendation to screen - Ages 81-79 yo.  Completed   HPV Vaccine  Aged Out  *Topic was postponed. The date shown is not the original due date.    Advanced directives: Advance directive discussed with you today. Even though you declined this today please call our office should you change your mind and we can give you the proper paperwork for you to fill out.  Conditions/risks identified: get back to weight lifting   Next appointment: Follow up in one year for your annual wellness visit.   Preventive Care 80 Years and Older, Male  Preventive care refers to lifestyle choices and visits with your health care provider that can promote health and wellness. What does preventive care include? A yearly physical exam. This is also called an annual well check. Dental exams once or twice a year. Routine eye exams. Ask your health care provider how often  you should have your eyes checked. Personal lifestyle choices, including: Daily care of your teeth and gums. Regular physical activity. Eating a healthy diet. Avoiding tobacco and drug use. Limiting alcohol use. Practicing safe sex. Taking low doses of aspirin every day. Taking vitamin and mineral supplements as recommended by your health care provider. What happens during an annual well check? The services and screenings done by your health care provider during your annual well check will depend on your age, overall health, lifestyle risk factors, and family history of disease. Counseling  Your health care provider may ask you questions about your: Alcohol use. Tobacco use. Drug use. Emotional well-being. Home and relationship well-being. Sexual activity. Eating habits. History of falls. Memory and ability to understand (cognition). Work and work Astronomer. Screening  You may have the following tests or measurements: Height, weight, and BMI. Blood pressure. Lipid and cholesterol levels. These may be checked every 5 years, or more frequently if you are over 26 years old. Skin check. Lung cancer screening. You may have this screening every year starting at age 5 if you have a 30-pack-year history of smoking and currently smoke or have quit within the past 15 years. Fecal occult blood test (FOBT) of the stool. You  may have this test every year starting at age 31. Flexible sigmoidoscopy or colonoscopy. You may have a sigmoidoscopy every 5 years or a colonoscopy every 10 years starting at age 15. Prostate cancer screening. Recommendations will vary depending on your family history and other risks. Hepatitis C blood test. Hepatitis B blood test. Sexually transmitted disease (STD) testing. Diabetes screening. This is done by checking your blood sugar (glucose) after you have not eaten for a while (fasting). You may have this done every 1-3 years. Abdominal aortic aneurysm (AAA)  screening. You may need this if you are a current or former smoker. Osteoporosis. You may be screened starting at age 43 if you are at high risk. Talk with your health care provider about your test results, treatment options, and if necessary, the need for more tests. Vaccines  Your health care provider may recommend certain vaccines, such as: Influenza vaccine. This is recommended every year. Tetanus, diphtheria, and acellular pertussis (Tdap, Td) vaccine. You may need a Td booster every 10 years. Zoster vaccine. You may need this after age 77. Pneumococcal 13-valent conjugate (PCV13) vaccine. One dose is recommended after age 6. Pneumococcal polysaccharide (PPSV23) vaccine. One dose is recommended after age 25. Talk to your health care provider about which screenings and vaccines you need and how often you need them. This information is not intended to replace advice given to you by your health care provider. Make sure you discuss any questions you have with your health care provider. Document Released: 09/13/2015 Document Revised: 05/06/2016 Document Reviewed: 06/18/2015 Elsevier Interactive Patient Education  2017 [employer].  Fall Prevention in the Home Falls can cause injuries. They can happen to people of all ages. There are many things you can do to make your home safe and to help prevent falls. What can I do on the outside of my home? Regularly fix the edges of walkways and driveways and fix any cracks. Remove anything that might make you trip as you walk through a door, such as a raised step or threshold. Trim any bushes or trees on the path to your home. Use bright outdoor lighting. Clear any walking paths of anything that might make someone trip, such as rocks or tools. Regularly check to see if handrails are loose or broken. Make sure that both sides of any steps have handrails. Any raised decks and porches should have guardrails on the edges. Have any leaves, snow, or ice  cleared regularly. Use sand or salt on walking paths during winter. Clean up any spills in your garage right away. This includes oil or grease spills. What can I do in the bathroom? Use night lights. Install grab bars by the toilet and in the tub and shower. Do not use towel bars as grab bars. Use non-skid mats or decals in the tub or shower. If you need to sit down in the shower, use a plastic, non-slip stool. Keep the floor dry. Clean up any water that spills on the floor as soon as it happens. Remove soap buildup in the tub or shower regularly. Attach bath mats securely with double-sided non-slip rug tape. Do not have throw rugs and other things on the floor that can make you trip. What can I do in the bedroom? Use night lights. Make sure that you have a light by your bed that is easy to reach. Do not use any sheets or blankets that are too big for your bed. They should not hang down onto the floor. Have a  firm chair that has side arms. You can use this for support while you get dressed. Do not have throw rugs and other things on the floor that can make you trip. What can I do in the kitchen? Clean up any spills right away. Avoid walking on wet floors. Keep items that you use a lot in easy-to-reach places. If you need to reach something above you, use a strong step stool that has a grab bar. Keep electrical cords out of the way. Do not use floor polish or wax that makes floors slippery. If you must use wax, use non-skid floor wax. Do not have throw rugs and other things on the floor that can make you trip. What can I do with my stairs? Do not leave any items on the stairs. Make sure that there are handrails on both sides of the stairs and use them. Fix handrails that are broken or loose. Make sure that handrails are as long as the stairways. Check any carpeting to make sure that it is firmly attached to the stairs. Fix any carpet that is loose or worn. Avoid having throw rugs at the  top or bottom of the stairs. If you do have throw rugs, attach them to the floor with carpet tape. Make sure that you have a light switch at the top of the stairs and the bottom of the stairs. If you do not have them, ask someone to add them for you. What else can I do to help prevent falls? Wear shoes that: Do not have high heels. Have rubber bottoms. Are comfortable and fit you well. Are closed at the toe. Do not wear sandals. If you use a stepladder: Make sure that it is fully opened. Do not climb a closed stepladder. Make sure that both sides of the stepladder are locked into place. Ask someone to hold it for you, if possible. Clearly mark and make sure that you can see: Any grab bars or handrails. First and last steps. Where the edge of each step is. Use tools that help you move around (mobility aids) if they are needed. These include: Canes. Walkers. Scooters. Crutches. Turn on the lights when you go into a dark area. Replace any light bulbs as soon as they burn out. Set up your furniture so you have a clear path. Avoid moving your furniture around. If any of your floors are uneven, fix them. If there are any pets around you, be aware of where they are. Review your medicines with your doctor. Some medicines can make you feel dizzy. This can increase your chance of falling. Ask your doctor what other things that you can do to help prevent falls. This information is not intended to replace advice given to you by your health care provider. Make sure you discuss any questions you have with your health care provider. Document Released: 06/13/2009 Document Revised: 01/23/2016 Document Reviewed: 09/21/2014 Elsevier Interactive Patient Education  2017 [employer].
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
## 2023-01-12 ENCOUNTER — Other Ambulatory Visit: Payer: Self-pay

## 2023-01-12 MED ORDER — CLONAZEPAM 1 MG PO TABS: ORAL_TABLET | ORAL | 5 refills | Status: DC

## 2023-04-19 ENCOUNTER — Ambulatory Visit: Payer: Medicare PPO | Admitting: Adult Health

## 2023-04-19 ENCOUNTER — Encounter: Payer: Self-pay | Admitting: Adult Health

## 2023-04-19 VITALS — BP 106/70 | HR 74 | Ht 74.0 in | Wt 195.0 lb

## 2023-04-19 DIAGNOSIS — G4711 Idiopathic hypersomnia with long sleep time: Secondary | ICD-10-CM
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
## 2023-05-10 ENCOUNTER — Other Ambulatory Visit: Payer: Self-pay | Admitting: Family Medicine

## 2023-06-03 ENCOUNTER — Ambulatory Visit: Payer: Medicare PPO | Admitting: Family Medicine

## 2023-07-02 ENCOUNTER — Ambulatory Visit: Payer: Medicare PPO | Admitting: Family Medicine

## 2023-07-13 DIAGNOSIS — C61 Malignant neoplasm of prostate: Secondary | ICD-10-CM | POA: Diagnosis not present

## 2023-07-13 DIAGNOSIS — N3946 Mixed incontinence: Secondary | ICD-10-CM | POA: Diagnosis not present

## 2023-07-23 ENCOUNTER — Other Ambulatory Visit: Payer: Self-pay | Admitting: Adult Health

## 2023-07-26 NOTE — Telephone Encounter (Signed)
Spoke with [employer] @ [employer]. The patient is in their system as Johnathan Sosa. The Clonazepam was last picked up on 05/14/23 as a 30 day supply.

## 2023-10-20 ENCOUNTER — Ambulatory Visit: Payer: Self-pay | Admitting: Neurology

## 2023-11-02 ENCOUNTER — Ambulatory Visit (INDEPENDENT_AMBULATORY_CARE_PROVIDER_SITE_OTHER): Payer: Self-pay | Admitting: Neurology

## 2023-11-02 ENCOUNTER — Encounter: Payer: Self-pay | Admitting: Neurology

## 2023-11-02 VITALS — BP 98/70 | HR 80 | Ht 73.0 in | Wt 203.0 lb

## 2023-11-02 DIAGNOSIS — R2689 Other abnormalities of gait and mobility: Secondary | ICD-10-CM | POA: Diagnosis not present

## 2023-11-02 DIAGNOSIS — R419 Unspecified symptoms and signs involving cognitive functions and awareness: Secondary | ICD-10-CM | POA: Diagnosis not present

## 2023-11-02 DIAGNOSIS — R296 Repeated falls: Secondary | ICD-10-CM

## 2023-11-02 DIAGNOSIS — G309 Alzheimer's disease, unspecified: Secondary | ICD-10-CM | POA: Diagnosis not present

## 2023-11-02 DIAGNOSIS — S069X9S Unspecified intracranial injury with loss of consciousness of unspecified duration, sequela: Secondary | ICD-10-CM

## 2023-11-02 NOTE — Addendum Note (Signed)
 Addended by: Melvyn Novas on: 11/02/2023 11:21 AM   Modules accepted: Orders
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
## 2023-11-04 ENCOUNTER — Other Ambulatory Visit: Payer: Self-pay

## 2023-11-04 ENCOUNTER — Ambulatory Visit: Attending: Neurology | Admitting: Physical Therapy

## 2023-11-04 ENCOUNTER — Encounter: Payer: Self-pay | Admitting: Physical Therapy

## 2023-11-04 DIAGNOSIS — R2681 Unsteadiness on feet: Secondary | ICD-10-CM | POA: Insufficient documentation

## 2023-11-04 DIAGNOSIS — R2689 Other abnormalities of gait and mobility: Secondary | ICD-10-CM | POA: Insufficient documentation
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
## 2023-11-05 ENCOUNTER — Encounter: Payer: Self-pay | Admitting: Psychology

## 2023-11-06 ENCOUNTER — Other Ambulatory Visit: Payer: Self-pay | Admitting: Neurology

## 2023-11-08 ENCOUNTER — Encounter: Payer: Self-pay | Admitting: Neurology

## 2023-11-08 ENCOUNTER — Ambulatory Visit: Admitting: Physical Therapy

## 2023-11-08 ENCOUNTER — Encounter: Payer: Self-pay | Admitting: Physical Therapy

## 2023-11-08 DIAGNOSIS — R2681 Unsteadiness on feet: Secondary | ICD-10-CM

## 2023-11-08 DIAGNOSIS — R2689 Other abnormalities of gait and mobility: Secondary | ICD-10-CM

## 2023-11-08 LAB — COMPREHENSIVE METABOLIC PANEL
ALT: 31 IU/L (ref 0–44)
AST: 20 IU/L (ref 0–40)
Albumin: 4.5 g/dL (ref 3.8–4.8)
Alkaline Phosphatase: 71 IU/L (ref 44–121)
BUN/Creatinine Ratio: 16 (ref 10–24)
BUN: 19 mg/dL (ref 8–27)
Bilirubin Total: 0.5 mg/dL (ref 0.0–1.2)
CO2: 21 mmol/L (ref 20–29)
Calcium: 10.1 mg/dL (ref 8.6–10.2)
Chloride: 104 mmol/L (ref 96–106)
Creatinine, Ser: 1.16 mg/dL (ref 0.76–1.27)
Globulin, Total: 2.3 g/dL (ref 1.5–4.5)
Glucose: 77 mg/dL (ref 70–99)
Potassium: 4.2 mmol/L (ref 3.5–5.2)
Sodium: 141 mmol/L (ref 134–144)
Total Protein: 6.8 g/dL (ref 6.0–8.5)
eGFR: 66 mL/min/{1.73_m2} (ref >59)

## 2023-11-08 LAB — ATN PROFILE
A -- Beta-amyloid 42/40 Ratio: 0.113 (ref >0.102)
Beta-amyloid 40: 201.7 pg/mL
Beta-amyloid 42: 22.72 pg/mL
N -- NfL, Plasma: 5.17 pg/mL (ref 0.00–7.64)
T -- p-tau181: 1.18 pg/mL — ABNORMAL HIGH (ref 0.00–0.97)

## 2023-11-08 LAB — CBC WITH DIFFERENTIAL/PLATELET
Basophils Absolute: 0.1 10*3/uL (ref 0.0–0.2)
Basos: 1 %
EOS (ABSOLUTE): 0.3 10*3/uL (ref 0.0–0.4)
Eos: 4 %
Hematocrit: 47.2 % (ref 37.5–51.0)
Hemoglobin: 16.8 g/dL (ref 13.0–17.7)
Immature Grans (Abs): 0 10*3/uL (ref 0.0–0.1)
Immature Granulocytes: 0 %
Lymphocytes Absolute: 1.9 10*3/uL (ref 0.7–3.1)
Lymphs: 25 %
MCH: 32.2 pg (ref 26.6–33.0)
MCHC: 35.6 g/dL (ref 31.5–35.7)
MCV: 91 fL (ref 79–97)
Monocytes Absolute: 0.7 10*3/uL (ref 0.1–0.9)
Monocytes: 9 %
Neutrophils Absolute: 4.5 10*3/uL (ref 1.4–7.0)
Neutrophils: 61 %
Platelets: 268 10*3/uL (ref 150–450)
RBC: 5.21 x10E6/uL (ref 4.14–5.80)
RDW: 12.6 % (ref 11.6–15.4)
WBC: 7.5 10*3/uL (ref 3.4–10.8)

## 2023-11-08 LAB — APOE ALZHEIMER'S RISK

## 2023-11-08 LAB — TSH+FREE T4
Free T4: 1.19 ng/dL (ref 0.82–1.77)
TSH: 2.21 u[IU]/mL (ref 0.450–4.500)

## 2023-11-08 LAB — PROTEIN ELECTROPHORESIS, SERUM
A/G Ratio: 1.7 (ref 0.7–1.7)
Albumin ELP: 4.3 g/dL (ref 2.9–4.4)
Alpha 1: 0.2 g/dL (ref 0.0–0.4)
Alpha 2: 0.8 g/dL (ref 0.4–1.0)
Beta: 1 g/dL (ref 0.7–1.3)
Gamma Globulin: 0.5 g/dL (ref 0.4–1.8)
Globulin, Total: 2.5 g/dL (ref 2.2–3.9)

## 2023-11-08 LAB — ANA W/REFLEX: Anti Nuclear Antibody (ANA): NEGATIVE

## 2023-11-08 LAB — HOMOCYSTEINE: Homocysteine: 11.4 umol/L (ref 0.0–19.2)

## 2023-11-08 LAB — VITAMIN B12: Vitamin B-12: 723 pg/mL (ref 232–1245)

## 2023-11-08 LAB — SEDIMENTATION RATE: Sed Rate: 10 mm/h (ref 0–30)

## 2023-11-08 LAB — RPR: RPR Ser Ql: NONREACTIVE

## 2023-11-08 LAB — HIV ANTIBODY (ROUTINE TESTING W REFLEX): HIV Screen 4th Generation wRfx: NONREACTIVE

## 2023-11-08 NOTE — Telephone Encounter (Signed)
 Last seen on 11/02/23 Follow up scheduled on 05/04/24   Are you managing celexa 10 mg or PCP? In previous office note Megan mentioned pt PCP will be managing, but it appears refills has been signed under you name.   Please advise, it pending to approve or deny

## 2023-11-09 ENCOUNTER — Encounter: Payer: Self-pay | Admitting: Neurology

## 2023-11-09 NOTE — Addendum Note (Signed)
 Addended by: Melvyn Novas on: 11/09/2023 06:21 PM   Modules accepted: Orders

## 2023-11-10 ENCOUNTER — Telehealth: Payer: Self-pay | Admitting: Neurology

## 2023-11-10 ENCOUNTER — Ambulatory Visit: Admitting: Physical Therapy

## 2023-11-10 NOTE — Telephone Encounter (Signed)
 MRI brain Docia Barrier: [id number] exp. 11/10/23-01/10/24 sent to [facility] [phone]  The pet scan Berkley Harvey is pending faxed notes. Tracking [phone]

## 2023-11-11 ENCOUNTER — Encounter: Payer: Self-pay | Admitting: Neurology

## 2023-11-11 DIAGNOSIS — R296 Repeated falls: Secondary | ICD-10-CM

## 2023-11-11 DIAGNOSIS — S069X9S Unspecified intracranial injury with loss of consciousness of unspecified duration, sequela: Secondary | ICD-10-CM

## 2023-11-11 DIAGNOSIS — F079 Unspecified personality and behavioral disorder due to known physiological condition: Secondary | ICD-10-CM

## 2023-11-11 DIAGNOSIS — R419 Unspecified symptoms and signs involving cognitive functions and awareness: Secondary | ICD-10-CM

## 2023-11-15 ENCOUNTER — Telehealth: Payer: Self-pay | Admitting: Neurology

## 2023-11-15 ENCOUNTER — Telehealth: Payer: Self-pay

## 2023-11-15 ENCOUNTER — Ambulatory Visit

## 2023-11-15 DIAGNOSIS — R2681 Unsteadiness on feet: Secondary | ICD-10-CM

## 2023-11-15 DIAGNOSIS — R2689 Other abnormalities of gait and mobility: Secondary | ICD-10-CM
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
## 2023-11-15 NOTE — Telephone Encounter (Signed)
 Referral for neuropsychology sent through [facility] to [facility]. Phone: [phone]

## 2023-11-16 ENCOUNTER — Other Ambulatory Visit: Payer: Self-pay | Admitting: Adult Health

## 2023-11-16 ENCOUNTER — Encounter: Payer: Self-pay | Admitting: Neurology

## 2023-11-17 ENCOUNTER — Encounter: Payer: Self-pay | Admitting: Neurology

## 2023-11-17 ENCOUNTER — Ambulatory Visit: Admitting: Physical Therapy

## 2023-11-17 ENCOUNTER — Telehealth: Payer: Self-pay | Admitting: Neurology

## 2023-11-17 NOTE — Telephone Encounter (Signed)
 I have sent the refill request for the pt

## 2023-11-17 NOTE — Telephone Encounter (Signed)
 Pt called to discuss getting a prescription for Allprazolam send to  [facility] [id number]  Balance has gotten worse. Would like a call back.

## 2023-11-17 NOTE — Telephone Encounter (Signed)
 LETTER OF APPROVAL ARE SCANNED IN MEDIA OF CHART.

## 2023-11-18 ENCOUNTER — Encounter (HOSPITAL_COMMUNITY)
Admission: RE | Admit: 2023-11-18 | Discharge: 2023-11-18 | Disposition: A | Discharge status: Home / Self Care | Source: Ambulatory Visit | Attending: Neurology | Admitting: Neurology

## 2023-11-18 ENCOUNTER — Ambulatory Visit (HOSPITAL_COMMUNITY)
Admission: RE | Admit: 2023-11-18 | Discharge: 2023-11-18 | Disposition: A | Discharge status: Home / Self Care | Source: Ambulatory Visit | Attending: Neurology | Admitting: Neurology

## 2023-11-18 DIAGNOSIS — G309 Alzheimer's disease, unspecified: Secondary | ICD-10-CM

## 2023-11-18 DIAGNOSIS — G319 Degenerative disease of nervous system, unspecified: Secondary | ICD-10-CM | POA: Diagnosis not present

## 2023-11-18 LAB — GLUCOSE, CAPILLARY: Glucose-Capillary: 97 mg/dL (ref 70–99)

## 2023-11-18 MED ORDER — GADOBUTROL 1 MMOL/ML IV SOLN
9.0000 mL | Freq: Once | INTRAVENOUS | Status: AC | PRN
  Administered 2023-11-18: 9 mL via INTRAVENOUS

## 2023-11-18 MED ORDER — ALPRAZOLAM 0.25 MG PO TABS: 0.2500 mg | ORAL_TABLET | ORAL | 0 refills | Status: AC | PRN

## 2023-11-18 MED ORDER — FLUDEOXYGLUCOSE F - 18 (FDG) INJECTION
10.0000 | Freq: Once | INTRAVENOUS | Status: AC | PRN
  Administered 2023-11-18: 10.2 via INTRAVENOUS

## 2023-11-22 ENCOUNTER — Ambulatory Visit: Admitting: Physical Therapy

## 2023-11-23 NOTE — Telephone Encounter (Signed)
 The pet scan was approved for 2023-11-10 - 2024-01-10 auth: [id number]

## 2023-11-24 ENCOUNTER — Ambulatory Visit

## 2023-11-29 ENCOUNTER — Ambulatory Visit: Admitting: Physical Therapy

## 2023-11-30 ENCOUNTER — Telehealth: Payer: Self-pay | Admitting: Neurology

## 2023-11-30 NOTE — Telephone Encounter (Signed)
 Pt's ex wife, Dawayne Patricia (on [place]) know Dr. Vickey Huger is on vacation, but need MRI and PET Scan.read for results. Need results ASAP to be able to have the diagnosis of dementia for the family. Would like a call back.

## 2023-12-01 ENCOUNTER — Ambulatory Visit

## 2023-12-02 NOTE — Progress Notes (Signed)
 I communicated these results yesterday to the patient I believe

## 2023-12-02 NOTE — Progress Notes (Signed)
 Kindly inform the patient that MRI scan of the brain shows mild age-related changes of hardening of the arteries and shrinkage of the brain.  No worrisome findings

## 2023-12-03 ENCOUNTER — Telehealth: Payer: Self-pay

## 2023-12-03 NOTE — Telephone Encounter (Signed)
-----   Message from Delia Heady sent at 12/02/2023  6:00 PM EDT ----- Johnathan Sosa inform the patient that MRI scan of the brain shows mild age-related changes of hardening of the arteries and shrinkage of the brain.  No worrisome findings

## 2023-12-03 NOTE — Telephone Encounter (Signed)
 Spoke w/Pt ex-spouse listed on DPR. She stated she was very thankful for Dr. Marlis Edelson call and his explanations of the test results.  Dr. Pearlean Brownie noted that he called the Pt number and communicated the results of the MRI scan of the brain showing age-related changes of hardening of the arteries and mild atrophy.  PET scan suggesting mild focal changes which could be compatible with early Alzheimer's.  [facility] having only 1 copy of abnormal EEG for as well as elevated B12 levels which also suggests likely early Alzheimer's.  Advised him to wait till Dr. Vickey Huger returns from vacation to further discuss these results and come up with treatment plan. They voiced understanding.  Ex-spouse stated today she was very appreciative for Dr. Pearlean Brownie and all the staff. Discussed as Dr. Pearlean Brownie had stated, once Dr. Vickey Huger returns from vacation and has a chance to review the test results she may decide to see the Pt prior to the scheduled 05/04/24 visit and if so we will reach out. Spouse stated understanding and voiced thanks for the call.

## 2023-12-06 ENCOUNTER — Ambulatory Visit

## 2023-12-08 ENCOUNTER — Ambulatory Visit: Attending: Cardiology | Admitting: Cardiology

## 2023-12-08 ENCOUNTER — Other Ambulatory Visit: Payer: Self-pay | Admitting: Cardiology

## 2023-12-08 ENCOUNTER — Encounter: Payer: Self-pay | Admitting: Cardiology

## 2023-12-08 ENCOUNTER — Ambulatory Visit

## 2023-12-08 ENCOUNTER — Ambulatory Visit (INDEPENDENT_AMBULATORY_CARE_PROVIDER_SITE_OTHER)

## 2023-12-08 VITALS — BP 132/82 | HR 74 | Wt 200.2 lb

## 2023-12-08 DIAGNOSIS — I10 Essential (primary) hypertension: Secondary | ICD-10-CM | POA: Diagnosis not present

## 2023-12-08 DIAGNOSIS — R42 Dizziness and giddiness: Secondary | ICD-10-CM

## 2023-12-08 NOTE — Patient Instructions (Signed)
 Medication Instructions:  Continue same medications  Lab Work: None ordered  Testing/Procedures:  2 week heart monitor ( Zio )  will be mailed to your home with instructions  Follow-Up: At [facility], you and your health needs are our priority.  As part of our continuing mission to provide you with exceptional heart care, our providers are all part of one team.  This team includes your primary Cardiologist (physician) and Advanced Practice Providers or APPs (Physician Assistants and Nurse Practitioners) who all work together to provide you with the care you need, when you need it.  Your next appointment:  To Be Determined    Provider:  Dr.Jordan   We recommend signing up for the patient portal called "MyChart".  Sign up information is provided on this After Visit Summary.  MyChart is used to connect with patients for Virtual Visits (Telemedicine).  Patients are able to view lab/test results, encounter notes, upcoming appointments, etc.  Non-urgent messages can be sent to your provider as well.   To learn more about what you can do with MyChart, go to [email or web address].        1st Floor: - Lobby - Registration  - Pharmacy  - Lab - Cafe  2nd Floor: - PV Lab - Diagnostic Testing (echo, CT, nuclear med)  3rd Floor: - Vacant  4th Floor: - TCTS (cardiothoracic surgery) - AFib Clinic - Structural Heart Clinic - Vascular Surgery  - Vascular Ultrasound  5th Floor: - HeartCare Cardiology (general and EP) - Clinical Pharmacy for coumadin, hypertension, lipid, weight-loss medications, and med management appointments    Valet parking services will be available as well.

## 2023-12-08 NOTE — Progress Notes (Unsigned)
 Enrolled for [employer] to mail a ZIO XT long term holter monitor to the patients address on file.  Requested delivery date of 12/15/23.
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
## 2023-12-13 ENCOUNTER — Ambulatory Visit

## 2023-12-15 ENCOUNTER — Encounter: Payer: Self-pay | Admitting: Neurology

## 2023-12-15 ENCOUNTER — Ambulatory Visit: Admitting: Physical Therapy

## 2023-12-30 ENCOUNTER — Telehealth: Payer: Self-pay | Admitting: Neurology

## 2023-12-30 NOTE — Telephone Encounter (Signed)
 Pt is requesting a letter of competency, please call pt to discuss.

## 2024-01-03 ENCOUNTER — Telehealth: Payer: Self-pay

## 2024-01-03 ENCOUNTER — Telehealth: Payer: Self-pay | Admitting: Neurology

## 2024-01-03 NOTE — Telephone Encounter (Signed)
 Pt wife called in and I helped them change there password and they voiced understanding

## 2024-01-03 NOTE — Telephone Encounter (Signed)
 See below, can front desk assist pt with this?  Copied from CRM [id number]. Topic: MyChart - Other >> Jan 03, 2024 11:08 AM Johnathan Sosa wrote: Reason for CRM: patient called stating he would like to get access to his mychart again because someone else was doing it and they do not relay the messages good, so he want to get back access to it

## 2024-01-03 NOTE — Telephone Encounter (Signed)
 Spoke w/Pt spouse(HCPOA) regarding the letter of competency she is requesting. Spouse stated on 12/01/23 she received email from [facility] asking for the [facility] for [facility] access which the spouse already has. Spouse set up Pt MC in January. Stated the HCPOA was given to [facility] prior to this message but was submitted again. She then received a request for a form to be completed with the Pt signature. This form was completed, signed, and sent back to [facility] via email. Spouse states she has again received a request from [facility] now asking for a letter of competency from his neurologist. Spouse stated when she calls the number included in the email the person answering does not know what the spouse is referring to when she asks about the previous paperwork being submitted. Spouse stated the HCPOA was completed by an attorney and notorized in 2016 and nothing has changed. Informed spouse not to send anything else. The [facility] account is set up and working and she is listed on the dpr. Unsure why this is being requested of her now. Spouse stated understanding and that she will not be sending anything else. Spouse requested an appt for Pt with Dr. Albertina Hugger sooner than the scheduled 05/04/24 appt. Was able to schedule Pt for an appt on 03/16/24 at 3:30pm. Spouse voiced gratitude for the earlier appt and gratitude to Dr. Albertina Hugger and all in the office who have helped with Pt.

## 2024-01-03 NOTE — Telephone Encounter (Signed)
 Patient called to get MyChart Help Desk phone number.

## 2024-01-05 DIAGNOSIS — R42 Dizziness and giddiness: Secondary | ICD-10-CM

## 2024-01-05 DIAGNOSIS — I10 Essential (primary) hypertension: Secondary | ICD-10-CM | POA: Diagnosis not present

## 2024-01-05 NOTE — Telephone Encounter (Signed)
 Pt is asking for a call from Dr Albertina Hugger to express the importance of the needed letter, please call [phone]

## 2024-01-10 ENCOUNTER — Telehealth: Payer: Self-pay | Admitting: Neurology

## 2024-01-10 NOTE — Telephone Encounter (Signed)
 Pt called requesting to speak to the Provider regarding a letter he is needing typed up for his attorney. Pt states that he is needing this letter for him to be able to do his trust fund for his children. Please advise.

## 2024-01-10 NOTE — Telephone Encounter (Signed)
 [place] , [place]. And she accepted a sooner apt with Dr. Albertina Hugger on Thursday for the patient. They will be present.

## 2024-01-10 NOTE — Telephone Encounter (Signed)
 Pt has an apt on Thursday and we can discuss all of this for the upcoming appointment.

## 2024-01-12 NOTE — Telephone Encounter (Signed)
 Pt is calling to speak to Provider regarding letter for attorney. Informed Pt that he does have appointment tomorrow he can ask Provider at appt . Pt states it is urgent and need to ask one simple question before appt.

## 2024-01-12 NOTE — Telephone Encounter (Signed)
 Called the patient back and the patient and he is needing a letter to indicate that he is competent to make decisions for his will. He states that he doesn't feel that he is having any problems with his memory. [place] and states that Paola Bohr followed behind him and that she didn't note anything concerning. Informed him that we can't complete a letter until we evaluate how things are and that based off last visit the [facility] score would not have deemed him necessarily competent. Advised that we will reassess and see how things are. Pt verbalized understanding. Pt had no questions at this time but was encouraged to call back if questions arise.

## 2024-01-13 ENCOUNTER — Encounter: Payer: Self-pay | Admitting: Neurology

## 2024-01-13 ENCOUNTER — Other Ambulatory Visit: Payer: Self-pay | Admitting: Adult Health

## 2024-01-13 ENCOUNTER — Ambulatory Visit (INDEPENDENT_AMBULATORY_CARE_PROVIDER_SITE_OTHER): Admitting: Neurology

## 2024-01-13 VITALS — BP 121/92 | HR 97 | Ht 73.5 in | Wt 196.0 lb

## 2024-01-13 DIAGNOSIS — Z711 Person with feared health complaint in whom no diagnosis is made: Secondary | ICD-10-CM | POA: Diagnosis not present
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
## 2024-01-17 ENCOUNTER — Ambulatory Visit: Payer: Medicare PPO

## 2024-02-24 ENCOUNTER — Other Ambulatory Visit: Payer: Self-pay

## 2024-02-24 MED ORDER — BUPROPION HCL ER (XL) 300 MG PO TB24: 300.0000 mg | ORAL_TABLET | Freq: Every day | ORAL | 2 refills | Status: AC

## 2024-03-14 ENCOUNTER — Encounter: Admitting: Psychology

## 2024-03-15 ENCOUNTER — Telehealth: Payer: Self-pay | Admitting: Neurology

## 2024-03-15 NOTE — Telephone Encounter (Signed)
 Called the patient to advise that the apt for tomorrow is no longer needed. It was scheduled prior to when we worked him in for 5/15 visit. In VM left message letting him know if he doesn't want to keep the apt, give us  a call back.if apt is needed than we can keep it.

## 2024-03-16 ENCOUNTER — Ambulatory Visit: Admitting: Neurology

## 2024-03-16 ENCOUNTER — Encounter: Payer: Self-pay | Admitting: Neurology

## 2024-03-16 VITALS — BP 114/76 | HR 86 | Ht 74.0 in | Wt 193.0 lb

## 2024-03-16 DIAGNOSIS — G3184 Mild cognitive impairment, so stated: Secondary | ICD-10-CM
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
## 2024-03-16 NOTE — Telephone Encounter (Signed)
 Message relayed to pt, he wants to keep the appointment for this afternoon.

## 2024-03-30 ENCOUNTER — Telehealth: Payer: Self-pay | Admitting: Family Medicine

## 2024-03-30 NOTE — Telephone Encounter (Signed)
 Copied from CRM [id number]. Topic: General - Other >> Mar 30, 2024  1:54 PM Burnard DEL wrote: Reason for CRM: Patient called in after receiving reminder form [place] of his appointment with [facility] on Monday August 4,2025. Patient was frustrated that he did not know anything about this appointment until now.Stated that he never spoke with anyone to reschedule the appointment from May.

## 2024-04-03 ENCOUNTER — Ambulatory Visit

## 2024-04-04 NOTE — Telephone Encounter (Signed)
 Unable to reach patient to explain the AWV appointment.  NHA tried to reach patient and care guide also tried to reach patient.  The Mailbox is full so you cannot leave the patient any messages.Johnathan Sosa

## 2024-05-03 ENCOUNTER — Telehealth: Payer: Self-pay | Admitting: Neurology

## 2024-05-03 ENCOUNTER — Other Ambulatory Visit: Payer: Self-pay | Admitting: Neurology

## 2024-05-03 MED ORDER — CLONAZEPAM 1 MG PO TABS: 1.0000 mg | ORAL_TABLET | Freq: Every evening | ORAL | 1 refills | Status: AC | PRN

## 2024-05-03 NOTE — Telephone Encounter (Signed)
 Refill submitted for a 90 day supply for the patient as requested.

## 2024-05-03 NOTE — Telephone Encounter (Signed)
 Called patient to cancel appointment for 9/4 since he was just seen in July. Patient stated that he will need a refill of clonazePAM  (KLONOPIN ) 1 MG tablet soon and would like to have a 90 day supply if possible

## 2024-05-04 ENCOUNTER — Ambulatory Visit: Admitting: Neurology

## 2024-05-12 ENCOUNTER — Other Ambulatory Visit: Payer: Self-pay | Admitting: Family Medicine

## 2024-07-04 DIAGNOSIS — C61 Malignant neoplasm of prostate: Secondary | ICD-10-CM | POA: Diagnosis not present

## 2024-07-12 DIAGNOSIS — C61 Malignant neoplasm of prostate: Secondary | ICD-10-CM | POA: Diagnosis not present

## 2024-08-06 ENCOUNTER — Other Ambulatory Visit: Payer: Self-pay | Admitting: Neurology

## 2025-03-22 ENCOUNTER — Ambulatory Visit: Admitting: Neurology
